# Patient Record
Sex: Female | Born: 1950 | Race: Black or African American | Hispanic: No | Smoking: Former smoker
Health system: Southern US, Community
[De-identification: ages and names within clinical notes are randomized; demographics above are authoritative.]

## PROBLEM LIST (undated history)

## (undated) DIAGNOSIS — I2510 Atherosclerotic heart disease of native coronary artery without angina pectoris: Secondary | ICD-10-CM

## (undated) DIAGNOSIS — Z950 Presence of cardiac pacemaker: Secondary | ICD-10-CM

## (undated) DIAGNOSIS — E785 Hyperlipidemia, unspecified: Secondary | ICD-10-CM

## (undated) DIAGNOSIS — I10 Essential (primary) hypertension: Secondary | ICD-10-CM

## (undated) DIAGNOSIS — I509 Heart failure, unspecified: Secondary | ICD-10-CM

## (undated) DIAGNOSIS — IMO0002 Reserved for concepts with insufficient information to code with codable children: Secondary | ICD-10-CM

## (undated) HISTORY — DX: Hyperlipidemia, unspecified: E78.5

## (undated) HISTORY — DX: Heart failure, unspecified: I50.9

## (undated) HISTORY — PX: COLONOSCOPY: SHX174

## (undated) HISTORY — PX: CARDIAC DEFIBRILLATOR PLACEMENT: SHX171

## (undated) HISTORY — DX: Reserved for concepts with insufficient information to code with codable children: IMO0002

## (undated) HISTORY — DX: Atherosclerotic heart disease of native coronary artery without angina pectoris: I25.10

---

## 2004-09-15 ENCOUNTER — Ambulatory Visit: Payer: Self-pay | Admitting: Internal Medicine

## 2005-09-21 ENCOUNTER — Ambulatory Visit: Payer: Self-pay | Admitting: Internal Medicine

## 2006-09-25 ENCOUNTER — Ambulatory Visit: Payer: Self-pay | Admitting: Internal Medicine

## 2007-09-27 ENCOUNTER — Ambulatory Visit: Payer: Self-pay | Admitting: Internal Medicine

## 2008-09-28 ENCOUNTER — Ambulatory Visit: Payer: Self-pay | Admitting: Internal Medicine

## 2008-12-12 ENCOUNTER — Emergency Department: Payer: Self-pay | Admitting: Emergency Medicine

## 2008-12-12 ENCOUNTER — Inpatient Hospital Stay (HOSPITAL_COMMUNITY): Admission: AD | Admit: 2008-12-12 | Discharge: 2008-12-16 | Payer: Self-pay | Admitting: Cardiovascular Disease

## 2008-12-12 DIAGNOSIS — I219 Acute myocardial infarction, unspecified: Secondary | ICD-10-CM

## 2008-12-12 DIAGNOSIS — I2510 Atherosclerotic heart disease of native coronary artery without angina pectoris: Secondary | ICD-10-CM | POA: Insufficient documentation

## 2008-12-12 HISTORY — DX: Acute myocardial infarction, unspecified: I21.9

## 2008-12-12 HISTORY — DX: Atherosclerotic heart disease of native coronary artery without angina pectoris: I25.10

## 2009-01-06 ENCOUNTER — Emergency Department (HOSPITAL_COMMUNITY): Admission: EM | Admit: 2009-01-06 | Discharge: 2009-01-06 | Payer: Self-pay | Admitting: Emergency Medicine

## 2009-01-19 ENCOUNTER — Encounter: Payer: Self-pay | Admitting: Cardiovascular Disease

## 2009-02-11 ENCOUNTER — Encounter: Payer: Self-pay | Admitting: Cardiovascular Disease

## 2009-09-30 ENCOUNTER — Ambulatory Visit: Payer: Self-pay | Admitting: Internal Medicine

## 2009-12-26 IMAGING — CR DG CHEST 1V PORT
1 series · 1 of 1 positions shown · non-contrast
Comparison: None

CLINICAL DATA: CHF

PORTABLE CHEST - 1 VIEW

[AP]
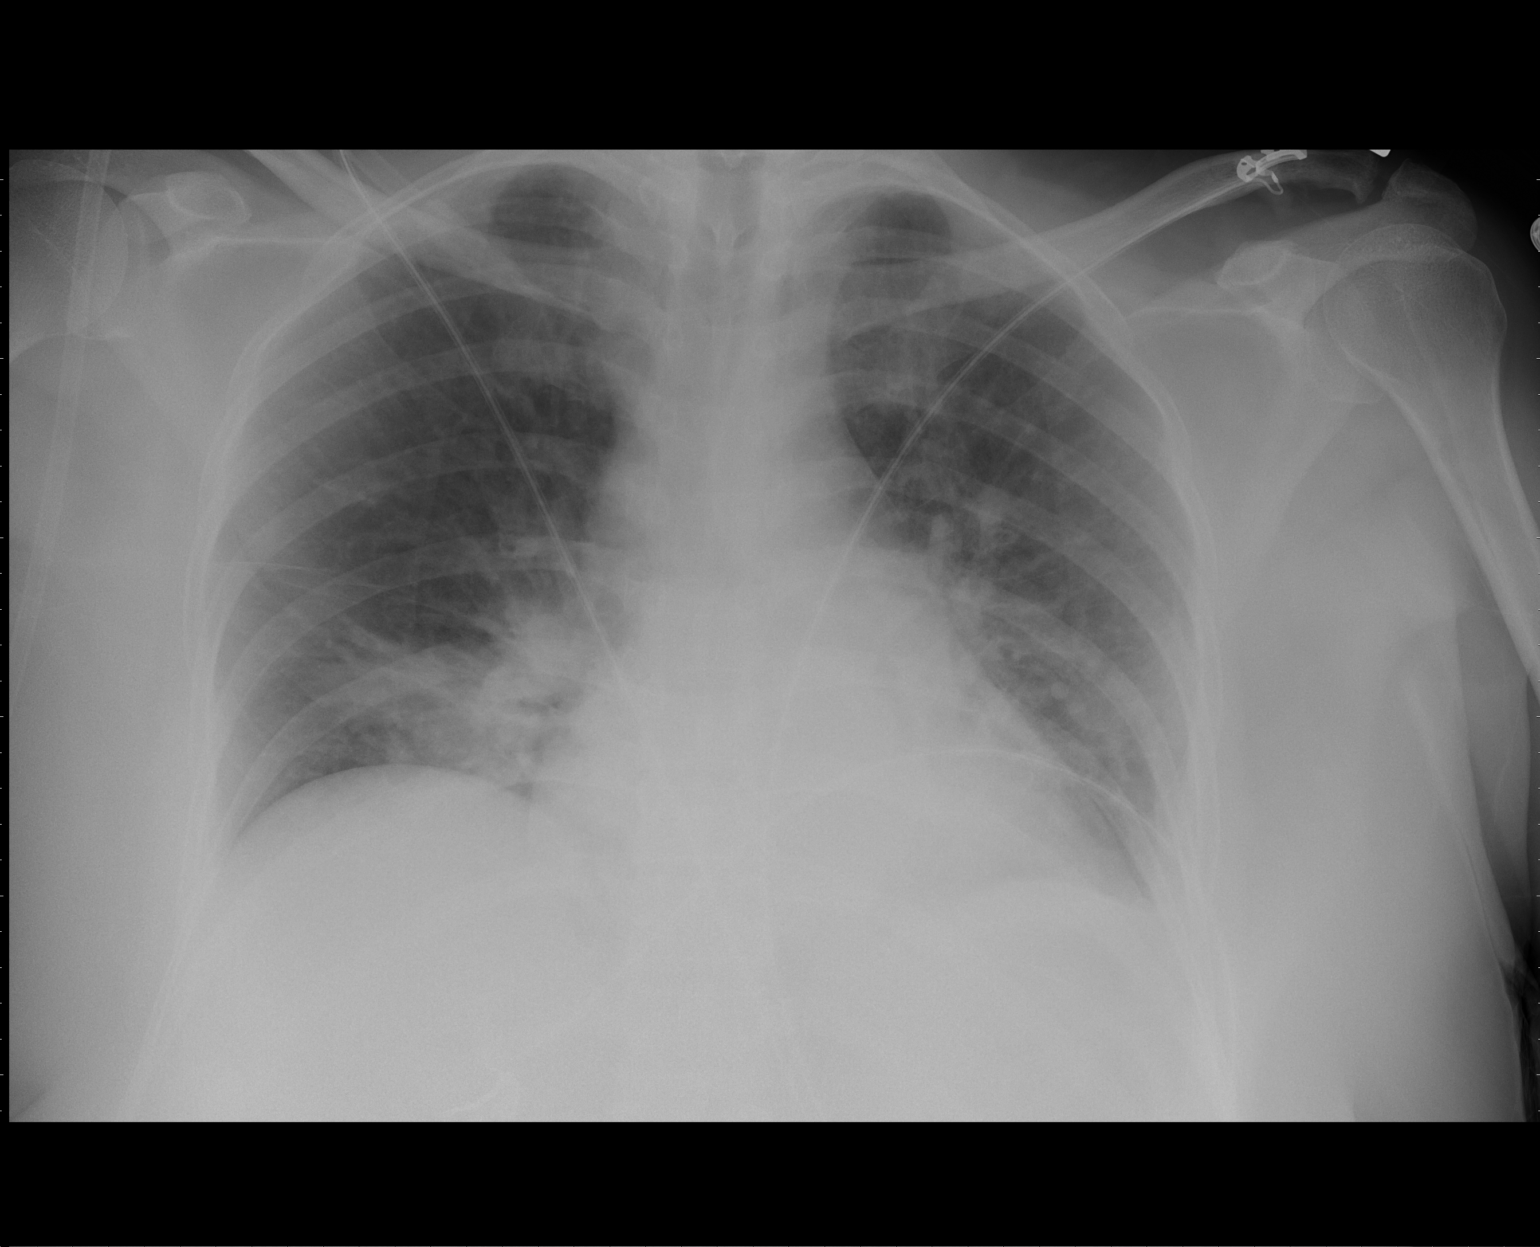

[1 of 1 positions shown; findings below may reference images not displayed]

FINDINGS: The heart is borderline enlarged.  Pulmonary vascularity
is within normal limits.  Lungs are under aerated with bibasilar
atelectasis versus consolidation.  No pneumothorax.
IMPRESSION: Borderline cardiomegaly.

Bibasilar atelectasis versus airspace consolidation.

## 2010-03-31 DIAGNOSIS — I480 Paroxysmal atrial fibrillation: Secondary | ICD-10-CM | POA: Insufficient documentation

## 2010-03-31 DIAGNOSIS — E785 Hyperlipidemia, unspecified: Secondary | ICD-10-CM

## 2010-05-04 ENCOUNTER — Encounter: Payer: Self-pay | Admitting: Cardiovascular Disease

## 2010-05-04 ENCOUNTER — Ambulatory Visit: Payer: Self-pay | Admitting: Internal Medicine

## 2010-05-04 DIAGNOSIS — I428 Other cardiomyopathies: Secondary | ICD-10-CM | POA: Insufficient documentation

## 2010-05-04 DIAGNOSIS — I5022 Chronic systolic (congestive) heart failure: Secondary | ICD-10-CM | POA: Insufficient documentation

## 2010-05-04 DIAGNOSIS — I2589 Other forms of chronic ischemic heart disease: Secondary | ICD-10-CM

## 2010-05-05 LAB — CONVERTED CEMR LAB
CO2: 23 meq/L (ref 19–32)
Calcium: 9.7 mg/dL (ref 8.4–10.5)
Creatinine, Ser: 0.84 mg/dL (ref 0.40–1.20)
Hemoglobin: 14 g/dL (ref 12.0–15.0)
MCHC: 32.3 g/dL (ref 30.0–36.0)
Platelets: 231 10*3/uL (ref 150–400)
RBC: 5.13 M/uL — ABNORMAL HIGH (ref 3.87–5.11)
RDW: 14.4 % (ref 11.5–15.5)
Sodium: 138 meq/L (ref 135–145)
WBC: 6.5 10*3/uL (ref 4.0–10.5)

## 2010-05-09 ENCOUNTER — Ambulatory Visit (HOSPITAL_COMMUNITY): Admission: RE | Admit: 2010-05-09 | Discharge: 2010-05-10 | Payer: Self-pay | Admitting: Internal Medicine

## 2010-05-09 ENCOUNTER — Ambulatory Visit: Payer: Self-pay | Admitting: Internal Medicine

## 2010-05-09 DIAGNOSIS — Z95810 Presence of automatic (implantable) cardiac defibrillator: Secondary | ICD-10-CM

## 2010-05-09 HISTORY — DX: Presence of automatic (implantable) cardiac defibrillator: Z95.810

## 2010-05-10 ENCOUNTER — Encounter: Payer: Self-pay | Admitting: Internal Medicine

## 2010-05-17 ENCOUNTER — Encounter: Payer: Self-pay | Admitting: Internal Medicine

## 2010-05-20 ENCOUNTER — Ambulatory Visit: Payer: Self-pay | Admitting: Internal Medicine

## 2010-05-27 ENCOUNTER — Encounter: Payer: Self-pay | Admitting: Internal Medicine

## 2010-05-27 ENCOUNTER — Ambulatory Visit: Payer: Self-pay | Admitting: Internal Medicine

## 2010-06-03 ENCOUNTER — Telehealth: Payer: Self-pay | Admitting: Internal Medicine

## 2010-06-03 ENCOUNTER — Encounter: Payer: Self-pay | Admitting: Internal Medicine

## 2010-07-08 ENCOUNTER — Ambulatory Visit: Payer: Self-pay | Admitting: Cardiovascular Disease

## 2010-08-05 ENCOUNTER — Ambulatory Visit: Payer: Self-pay | Admitting: Cardiology

## 2010-08-30 ENCOUNTER — Ambulatory Visit: Payer: Self-pay | Admitting: Internal Medicine

## 2010-08-30 DIAGNOSIS — Z95810 Presence of automatic (implantable) cardiac defibrillator: Secondary | ICD-10-CM

## 2010-09-08 ENCOUNTER — Ambulatory Visit: Payer: Self-pay | Admitting: Cardiovascular Disease

## 2010-09-08 ENCOUNTER — Encounter: Payer: Self-pay | Admitting: Internal Medicine

## 2010-10-03 ENCOUNTER — Ambulatory Visit: Payer: Self-pay | Admitting: Internal Medicine

## 2010-12-02 ENCOUNTER — Encounter: Payer: Self-pay | Admitting: Internal Medicine

## 2010-12-02 ENCOUNTER — Ambulatory Visit: Admission: RE | Admit: 2010-12-02 | Discharge: 2010-12-02 | Payer: Self-pay | Source: Home / Self Care

## 2010-12-13 NOTE — Op Note (Signed)
Summary: Operative Report  Operative Report   Imported By: Frazier Butt Chriscoe 06/03/2010 16:33:13  _____________________________________________________________________  External Attachment:    Type:   Image     Comment:   External Document

## 2010-12-13 NOTE — Assessment & Plan Note (Signed)
Summary: nep eval for icd placement/jml   Visit Type:  Initial Consult Referring Provider:  Dr Kyra Searles Primary Provider:  Dr Dan Humphreys  CC:  low HR -- possible PCM.  History of Present Illness: [name] is referred today by Dr. Elease Hashimoto for consideration for prophylactic ICD insertion.  She has a h/o anterior MI in January of 2010 when she presented late having not sought medical attention initially within the first 3-4 hrs of symptoms.  She was treated with LAD stenting followed by maximal medical therapy with beta blockers and ACE inhibitors along with ASA and plavix.  She has done fairly well with persistent Class 2 CHF symptoms.  She has not had syncope.  She apparently had atrial fib initially after her MI but this has resolved and she is no longer on amiodarone.  She denies c/p or peripheral edema.  Preventive Screening-Counseling & Management  Caffeine-Diet-Exercise     Does Patient Exercise: yes      Drug Use:  no.    Current Medications (verified): 1)  Aspirin 81 Mg Tbec (Aspirin) .... Take One Tablet By Mouth Daily 2)  Plavix 75 Mg Tabs (Clopidogrel Bisulfate) .... Take One Tablet By Mouth Daily 3)  Furosemide 40 Mg Tabs (Furosemide) .... Take One Tablet By Mouth Daily. 4)  Potassium Chloride Cr 10 Meq Cr-Caps (Potassium Chloride) .... Take One Tablet By Mouth Daily 5)  Carvedilol 12.5 Mg Tabs (Carvedilol) .... Take One Tablet By Mouth Twice A Day 6)  Lisinopril 5 Mg Tabs (Lisinopril) .... Take One Tablet By Mouth Daily 7)  Simvastatin 40 Mg Tabs (Simvastatin) .... Take One Tablet By Mouth Daily At Bedtime 8)  Proair Hfa 108 (90 Base) Mcg/act Aers (Albuterol Sulfate) .... Every 6 Hrs As Needed  Allergies (verified): No Known Drug Allergies  Past History:  Past Medical History: Last updated: 03/31/2010 CAD: S/P ANTERIOR WALL MYOCARDIAL INFARCTION / S/P POST PTCA AND STENTING OF HER LAD 12/12/08. PLACEMENT OF VISION STENT. POST DILATED WITH A 3.25MM [place] VOYAGER CHF EF =  30-35% BY ECHO. SHE HAS AN APICAL ANEURYSM. HX OF INFECTED SALIVARY GLAND TRANSIENT ATRIAL FIB DYSLIPIDEMIA  Family History: Last updated: 05/04/2010 Family History of Cancer:  Family History of Coronary Artery Disease:   Social History: Last updated: 05/04/2010 Tobacco Use - Former.  Retired  Married  Alcohol Use - no Regular Exercise - yes Drug Use - no  Past Surgical History: none  Family History: Family History of Cancer:  Family History of Coronary Artery Disease:   Social History: Tobacco Use - Former.  Retired  Married  Alcohol Use - no Regular Exercise - yes Drug Use - no Does Patient Exercise:  yes Drug Use:  no  Review of Systems       All systems reviewed and negative except as noted in the HPI.  Vital Signs:  Patient profile:   60 year old female Height:      71 inches Weight:      225 pounds BMI:     31.49 Pulse rate:   84 / minute BP sitting:   130 / 80  (left arm) Cuff size:   regular  Vitals Entered By: Hardin Negus, RMA (May 04, 2010 11:02 AM)  Physical Exam  General:  Well appearing middle age woman in no acute distress.  HEENT: normal Neck: supple. No JVD. Carotids 2+ bilaterally no bruits Cor: RRR with normal S1 and S2.  PMI is enlarged and laterally displaced. No murmurs. Lungs: CTA with no wheezes,  rales, or rhonchi. Ab: soft, nontender. nondistended. No HSM. Good bowel sounds Ext: warm. no cyanosis, clubbing or edema Neuro: alert and oriented. Grossly nonfocal. affect pleasant    EKG  Procedure date:  05/04/2010  Findings:      Normal sinus rhythm with rate of:    Comments:      Evidence of remote septal MI.    Impression & Recommendations:  Problem # 1:  CARDIOMYOPATHY, ISCHEMIC (ICD-414.8) I have discussed the treament options with the patient and the risks/benefits/goals/expectations of the procedure have been discussed and she wishes to proceed with prophylactic ICD implant. The following medications were  removed from the medication list:    Nitrostat 0.4 Mg Subl (Nitroglycerin) .Marland Kitchen... 1 tablet under tongue at onset of chest pain; you may repeat every 5 minutes for up to 3 doses. Her updated medication list for this problem includes:    Aspirin 81 Mg Tbec (Aspirin) .Marland Kitchen... Take one tablet by mouth daily    Plavix 75 Mg Tabs (Clopidogrel bisulfate) .Marland Kitchen... Take one tablet by mouth daily    Furosemide 40 Mg Tabs (Furosemide) .Marland Kitchen... Take one tablet by mouth daily.    Carvedilol 12.5 Mg Tabs (Carvedilol) .Marland Kitchen... Take one tablet by mouth twice a day    Lisinopril 5 Mg Tabs (Lisinopril) .Marland Kitchen... Take one tablet by mouth daily  Orders: Bi-V ICD (Bi-V ICD) T-Basic Metabolic Panel (14782-95621) T-CBC No Diff ([id number]) T-Protime, Auto ([id number])  Problem # 2:  CHRONIC SYSTOLIC HEART FAILURE (ICD-428.22) She will continue her meds as below.  Her symptoms remain class 2. The following medications were removed from the medication list:    Nitrostat 0.4 Mg Subl (Nitroglycerin) .Marland Kitchen... 1 tablet under tongue at onset of chest pain; you may repeat every 5 minutes for up to 3 doses. Her updated medication list for this problem includes:    Aspirin 81 Mg Tbec (Aspirin) .Marland Kitchen... Take one tablet by mouth daily    Plavix 75 Mg Tabs (Clopidogrel bisulfate) .Marland Kitchen... Take one tablet by mouth daily    Furosemide 40 Mg Tabs (Furosemide) .Marland Kitchen... Take one tablet by mouth daily.    Carvedilol 12.5 Mg Tabs (Carvedilol) .Marland Kitchen... Take one tablet by mouth twice a day    Lisinopril 5 Mg Tabs (Lisinopril) .Marland Kitchen... Take one tablet by mouth daily  Orders: Bi-V ICD (Bi-V ICD) T-Basic Metabolic Panel (44010-27253) T-CBC No Diff ([id number]) T-Protime, Auto ([id number])  Problem # 3:  HYPERLIPIDEMIA-MIXED (ICD-272.4) She will remain on a low fat diet and medical therapy as below. Her updated medication list for this problem includes:    Simvastatin 40 Mg Tabs (Simvastatin) .Marland Kitchen... Take one tablet by mouth daily at bedtime  Patient  Instructions: 1)  Your physician recommends that you continue on your current medications as directed. Please refer to the Current Medication list given to you today. 2)  Your physician has recommended that you have a defibrillator inserted.  An implantable cardioverter defibrillator (ICD) is a small device that is placed in your chest or, in rare cases, your abdomen. This device uses electrical pulses or shocks to help control life-threatening, irregular heartbeats that could lead the heart to suddenly stop beating (sudden cardiac arrest). Leads are attached to the ICD that goes into your heart. This is done in the hospital and usually requires an overnight stay. Please see the instruction sheet given to you today for more information.

## 2010-12-13 NOTE — Assessment & Plan Note (Signed)
Summary: 11:15 APPT/AMD   Visit Type:  Follow-up Referring [name]:  Dr Kyra Searles Primary [name]:  Dr Dan Humphreys  CC:  Defib Implant 05/09/10.  "Doing well'..  History of Present Illness: [name] returns today for followup.  She is a pleasant 60 yo woman with an ICM s/p MI, class 2 CHF, and HTN.  She has an EF of 30% and underwent prophylactic ICD implant back in July.  She denies any c/p, sob, or peripheral edema and she has not had any ICD therapies.  Current Medications (verified): 1)  Aspirin 81 Mg Tbec (Aspirin) .... Take One Tablet By Mouth Daily 2)  Plavix 75 Mg Tabs (Clopidogrel Bisulfate) .... Take One Tablet By Mouth Daily 3)  Furosemide 40 Mg Tabs (Furosemide) .... Take One Tablet By Mouth Daily. 4)  Potassium Chloride Cr 10 Meq Cr-Caps (Potassium Chloride) .... Take One Tablet By Mouth Daily 5)  Carvedilol 12.5 Mg Tabs (Carvedilol) .... Take One Tablet By Mouth Twice A Day 6)  Lisinopril 5 Mg Tabs (Lisinopril) .... Take One Tablet By Mouth Daily 7)  Simvastatin 40 Mg Tabs (Simvastatin) .... Take One Tablet By Mouth Daily At Bedtime  Allergies (verified): No Known Drug Allergies  Past History:  Past Medical History: Last updated: 03/31/2010 CAD: S/P ANTERIOR WALL MYOCARDIAL INFARCTION / S/P POST PTCA AND STENTING OF HER LAD 12/12/08. PLACEMENT OF VISION STENT. POST DILATED WITH A 3.25MM [place] VOYAGER CHF EF = 30-35% BY ECHO. SHE HAS AN APICAL ANEURYSM. HX OF INFECTED SALIVARY GLAND TRANSIENT ATRIAL FIB DYSLIPIDEMIA  Past Surgical History: Last updated: 05/04/2010 none  Family History: Last updated: 05/04/2010 Family History of Cancer:  Family History of Coronary Artery Disease:   Social History: Last updated: 05/04/2010 Tobacco Use - Former.  Retired  Married  Alcohol Use - no Regular Exercise - yes Drug Use - no  Risk Factors: Exercise: yes (05/04/2010)  Risk Factors: Smoking Status: quit (03/31/2010)  Review of Systems  The patient denies chest  pain, syncope, dyspnea on exertion, and peripheral edema.    Vital Signs:  Patient profile:   60 year old female Height:      71 inches Weight:      226 pounds BMI:     31.63 Pulse rate:   88 / minute BP sitting:   128 / 62  (left arm) Cuff size:   regular  Vitals Entered By: Bishop Dublin, CMA (August 30, 2010 10:56 AM)  Physical Exam  General:  Well appearing middle age woman in no acute distress.  HEENT: normal Neck: supple. No JVD. Carotids 2+ bilaterally no bruits Cor: RRR with normal S1 and S2.  PMI is enlarged and laterally displaced. No murmurs. Lungs: CTA with no wheezes, rales, or rhonchi. A well healed ICD incision is present. Ab: soft, nontender. nondistended. No HSM. Good bowel sounds Ext: warm. no cyanosis, clubbing or edema Neuro: alert and oriented. Grossly nonfocal. affect pleasant     ICD Specifications Following MD:  [name], MD     ICD Vendor:  Medtronic     ICD Model Number:  [id number]     ICD Serial Number:  [id number] ICD DOI:  05/09/2010     ICD Implanting MD:  [name], MD  Lead 1:    Location: RV     DOI: 05/09/2010     Model #: [id number]     Serial #: [id number]     Status: active  Indications::  CM   ICD Follow Up Remote Check?  No Battery  Voltage:  3.20 V     Charge Time:  7.4 seconds     Underlying rhythm:  SR ICD Dependent:  No       ICD Device Measurements Right Ventricle:  Amplitude: 11.5 mV, Impedance: 589 ohms, Threshold: 0.75 V at 0.4 msec Shock Impedance: 53/69 ohms   Episodes Coumadin:  No Shock:  0     ATP:  0     Nonsustained:  0     Ventricular Pacing:  <0.1%  Brady Parameters Mode VVI     Lower Rate Limit:  40      Tachy Zones VF:  188     Next Cardiology Appt Due:  11/13/2010 Tech Comments:  Lead impedance alert values reprogrammed.  RV reprogrammed for chronic threshold.  No [facility] at this time.  ROV 3 months [place] [facility]. Altha Harm, LPN  August 30, 2010 11:06 AM  MD Comments:  Agree with  above.  Impression & Recommendations:  Problem # 1:  AUTOMATIC IMPLANTABLE CARDIAC DEFIBRILLATOR SITU (ICD-V45.02) Her device is working normally today.  Will recheck in several months.  Problem # 2:  CARDIOMYOPATHY, ISCHEMIC (ICD-414.8) She denies anginal symptoms.  Continue meds as below. Her updated medication list for this problem includes:    Aspirin 81 Mg Tbec (Aspirin) .Marland Kitchen... Take one tablet by mouth daily    Plavix 75 Mg Tabs (Clopidogrel bisulfate) .Marland Kitchen... Take one tablet by mouth daily    Furosemide 40 Mg Tabs (Furosemide) .Marland Kitchen... Take one tablet by mouth daily.    Carvedilol 12.5 Mg Tabs (Carvedilol) .Marland Kitchen... Take one tablet by mouth twice a day    Lisinopril 5 Mg Tabs (Lisinopril) .Marland Kitchen... Take one tablet by mouth daily  Problem # 3:  CHRONIC SYSTOLIC HEART FAILURE (ICD-428.22) Her symptoms remain class 2.  She will continue with a low sodium diet. Her updated medication list for this problem includes:    Aspirin 81 Mg Tbec (Aspirin) .Marland Kitchen... Take one tablet by mouth daily    Plavix 75 Mg Tabs (Clopidogrel bisulfate) .Marland Kitchen... Take one tablet by mouth daily    Furosemide 40 Mg Tabs (Furosemide) .Marland Kitchen... Take one tablet by mouth daily.    Carvedilol 12.5 Mg Tabs (Carvedilol) .Marland Kitchen... Take one tablet by mouth twice a day    Lisinopril 5 Mg Tabs (Lisinopril) .Marland Kitchen... Take one tablet by mouth daily

## 2010-12-13 NOTE — Progress Notes (Signed)
Summary: test results  Phone Note Call from Patient Call back at Home Phone [phone]   Caller: Daughter- alcamise leach [phone] Reason for Call: Talk to Nurse, Lab or Test Results Details for Reason: ct scan. Initial call taken by: Lorne Skeens,  June 03, 2010 4:59 PM  Follow-up for Phone Call        lmom for daughter let her know I'm not here on Mon Dennis Bast, RN, BSN  June 03, 2010 5:58 PM

## 2010-12-13 NOTE — Miscellaneous (Signed)
Summary: Device preload  Clinical Lists Changes  Observations: Added new observation of ICD INDICATN: CM (05/17/2010 15:31) Added new observation of ICDLEADSTAT1: active (05/17/2010 15:31) Added new observation of ICDLEADSER1: [id number] V (05/17/2010 15:31) Added new observation of ICDLEADMOD1: 9629  (05/17/2010 15:31) Added new observation of ICDLEADLOC1: RV  (05/17/2010 15:31) Added new observation of ICD IMP MD: [name], MD  (05/17/2010 15:31) Added new observation of ICDLEADDOI1: 05/09/2010  (05/17/2010 15:31) Added new observation of ICD IMPL DTE: 05/09/2010  (05/17/2010 15:31) Added new observation of ICD SERL#: [id number] H  (05/17/2010 15:31) Added new observation of ICD MODL#: [id number]  (05/17/2010 15:31) Added new observation of ICDMANUFACTR: Medtronic  (05/17/2010 15:31) Added new observation of ICD MD: [name], MD  (05/17/2010 15:31)       ICD Specifications Following MD:  [name], MD     ICD Vendor:  Medtronic     ICD Model Number:  D274VRC     ICD Serial Number:  [id number] ICD DOI:  05/09/2010     ICD Implanting MD:  [name], MD  Lead 1:    Location: RV     DOI: 05/09/2010     Model #: 6440     Serial #: [id number]     Status: active  Indications::  CM

## 2010-12-13 NOTE — Letter (Signed)
Summary: Implantable Device Instructions  Architectural technologist at [address]   [place], [place] [address]   Phone: [phone]  Fax: [phone]      Implantable Device Instructions  You are scheduled for:  _____ Permanent Transvenous Pacemaker __x___ Implantable Cardioverter Defibrillator _____ Implantable Loop Recorder _____ Generator Change  on June 27 at 7:30 with Dr.Reedy Biernat.  1.  Please arrive at the Short Stay Center at [facility] at 6:30am on the day of your procedure.  2.  Do not eat or drink the night before your procedure.  3.  Complete lab work today   4.  Do NOT take Plavix for 2  days prior to your procedure:  Do NOT take furosemide the morning of procedure  5.  Plan for an overnight stay.  Bring your insurance cards and a list of your medications.  6.  Wash your chest and neck with antibacterial soap (any brand) the evening before and the morning of your procedure.  Rinse well.  7.  Education material received:     ICD today in office.                      *If you have ANY questions after you get home, please call the office [phone].  *Every attempt is made to prevent procedures from being rescheduled.  Due to the nauture of Electrophysiology, rescheduling can happen.  The physician is always aware and directs the staff when this occurs.

## 2010-12-13 NOTE — Procedures (Signed)
Summary: WOUND CHECK 10 DAYS   Current Medications (verified): 1)  Aspirin 81 Mg Tbec (Aspirin) .... Take One Tablet By Mouth Daily 2)  Plavix 75 Mg Tabs (Clopidogrel Bisulfate) .... Take One Tablet By Mouth Daily 3)  Furosemide 40 Mg Tabs (Furosemide) .... Take One Tablet By Mouth Daily. 4)  Potassium Chloride Cr 10 Meq Cr-Caps (Potassium Chloride) .... Take One Tablet By Mouth Daily 5)  Carvedilol 12.5 Mg Tabs (Carvedilol) .... Take One Tablet By Mouth Twice A Day 6)  Lisinopril 5 Mg Tabs (Lisinopril) .... Take One Tablet By Mouth Daily 7)  Simvastatin 40 Mg Tabs (Simvastatin) .... Take One Tablet By Mouth Daily At Bedtime  Allergies (verified): No Known Drug Allergies   ICD Specifications Following MD:  [name], MD     ICD Vendor:  Medtronic     ICD Model Number:  D274VRC     ICD Serial Number:  [id number] H ICD DOI:  05/09/2010     ICD Implanting MD:  [name], MD  Lead 1:    Location: RV     DOI: 05/09/2010     Model #: 4098     Serial #: [id number]     Status: active  Indications::  CM   ICD Follow Up Remote Check?  No Battery Voltage:  3.20 V     Charge Time:  7.4 seconds     Underlying rhythm:  SR ICD Dependent:  No       ICD Device Measurements Right Ventricle:  Amplitude: 11.4 mV, Impedance: 589 ohms, Threshold: 0.5 V at 0.4 msec Shock Impedance: 43/50 ohms   Episodes Coumadin:  No Shock:  0     ATP:  0     Nonsustained:  0     Ventricular Pacing:  <0.1%  Brady Parameters Mode VVI     Lower Rate Limit:  40      Tachy Zones VF:  188     Next Cardiology Appt Due:  08/13/2010 Tech Comments:  Steri strips removed.  No redness or edema noted.  No parameter changes. Device function normal.  Activity restrictions along with device discharge instructions reviewed with the patient.  ROV 3 months with Dr. Ladona Ridgel in [place]. Altha Harm, LPN  May 20, 5620 8:52 AM

## 2010-12-15 NOTE — Procedures (Signed)
Summary: PACER/AMD   Current Medications (verified): 1)  Aspirin 81 Mg Tbec (Aspirin) .... Take One Tablet By Mouth Daily 2)  Plavix 75 Mg Tabs (Clopidogrel Bisulfate) .... Take One Tablet By Mouth Daily 3)  Furosemide 40 Mg Tabs (Furosemide) .... Take One Tablet By Mouth Daily. 4)  Potassium Chloride Cr 10 Meq Cr-Caps (Potassium Chloride) .... Take One Tablet By Mouth Daily 5)  Carvedilol 12.5 Mg Tabs (Carvedilol) .... Take One Tablet By Mouth Twice A Day 6)  Lisinopril 5 Mg Tabs (Lisinopril) .... Take One Tablet By Mouth Daily 7)  Simvastatin 40 Mg Tabs (Simvastatin) .... Take One Tablet By Mouth Daily At Bedtime  Allergies (verified): No Known Drug Allergies    ICD Specifications Following MD:  [name], MD     ICD Vendor:  [employer]tronic     ICD Model Number:  D274VRC     ICD Serial Number:  [id number] H ICD DOI:  05/09/2010     ICD Implanting MD:  [name], MD  Lead 1:    Location: RV     DOI: 05/09/2010     Model #: [id number]     Serial #: [id number]     Status: active  Indications::  CM   ICD Follow Up Remote Check?  No Battery Voltage:  3.17 V     Charge Time:  8.2 seconds     Underlying rhythm:  SR ICD Dependent:  No       ICD Device Measurements Right Ventricle:  Amplitude: 10.8 mV, Impedance: 589 ohms, Threshold: 0.5 V at 0.4 msec Shock Impedance: 54/73 ohms   Episodes Coumadin:  No Shock:  0     ATP:  0     Nonsustained:  4     Ventricular Pacing:  <0.1%  Brady Parameters Mode VVI     Lower Rate Limit:  40      Tachy Zones VF:  188     Next Cardiology Appt Due:  05/14/2011 Tech Comments:  No parameter changes. Device function normal.  4 NSVT episodes probable ST.  No [facility] @ this time.  Ms. Stern would like to follow up every 6 months for financial reasons. Altha Harm, LPN  December 02, 2010 4:00 PM

## 2010-12-21 NOTE — Cardiovascular Report (Signed)
Summary: Office Visit   Office Visit   Imported By: Roderic Ovens 12/14/2010 15:59:45  _____________________________________________________________________  External Attachment:    Type:   Image     Comment:   External Document

## 2011-01-04 NOTE — Progress Notes (Signed)
Summary: [facility]: Progress Note  [facility]: Progress Note   Imported By: Earl Many 12/28/2010 17:43:34  _____________________________________________________________________  External Attachment:    Type:   Image     Comment:   External Document

## 2011-01-29 LAB — SURGICAL PCR SCREEN: Staphylococcus aureus: NEGATIVE

## 2011-02-02 ENCOUNTER — Other Ambulatory Visit: Payer: Self-pay | Admitting: *Deleted

## 2011-02-02 DIAGNOSIS — E785 Hyperlipidemia, unspecified: Secondary | ICD-10-CM

## 2011-02-02 DIAGNOSIS — I10 Essential (primary) hypertension: Secondary | ICD-10-CM

## 2011-02-02 DIAGNOSIS — I2510 Atherosclerotic heart disease of native coronary artery without angina pectoris: Secondary | ICD-10-CM

## 2011-02-02 DIAGNOSIS — I509 Heart failure, unspecified: Secondary | ICD-10-CM

## 2011-02-02 MED ORDER — LISINOPRIL 5 MG PO TABS: 5.0000 mg | ORAL_TABLET | Freq: Every day | ORAL | Status: DC

## 2011-02-02 MED ORDER — CLOPIDOGREL BISULFATE 75 MG PO TABS: 75.0000 mg | ORAL_TABLET | Freq: Every day | ORAL | Status: DC

## 2011-02-02 MED ORDER — FUROSEMIDE 40 MG PO TABS: 40.0000 mg | ORAL_TABLET | Freq: Every day | ORAL | Status: DC

## 2011-02-02 MED ORDER — SIMVASTATIN 40 MG PO TABS: 40.0000 mg | ORAL_TABLET | Freq: Every evening | ORAL | Status: DC

## 2011-02-02 NOTE — Telephone Encounter (Signed)
Refill on meds per pharmacy fax.

## 2011-02-23 ENCOUNTER — Ambulatory Visit: Payer: Self-pay | Admitting: Cardiovascular Disease

## 2011-02-27 LAB — COMPREHENSIVE METABOLIC PANEL
ALT: 55 U/L — ABNORMAL HIGH (ref 0–35)
AST: 297 U/L — ABNORMAL HIGH (ref 0–37)
Albumin: 3.9 g/dL (ref 3.5–5.2)
Alkaline Phosphatase: 97 U/L (ref 39–117)
CO2: 27 mEq/L (ref 19–32)
Calcium: 9.6 mg/dL (ref 8.4–10.5)
Chloride: 100 mEq/L (ref 96–112)
Creatinine, Ser: 0.7 mg/dL (ref 0.4–1.2)
GFR calc Af Amer: >60 mL/min (ref >60)
Glucose, Bld: 121 mg/dL — ABNORMAL HIGH (ref 70–99)
Potassium: 3.5 mEq/L (ref 3.5–5.1)
Sodium: 139 mEq/L (ref 135–145)
Total Bilirubin: 0.8 mg/dL (ref 0.3–1.2)
Total Protein: 7.7 g/dL (ref 6.0–8.3)

## 2011-02-27 LAB — CARDIAC PANEL(CRET KIN+CKTOT+MB+TROPI)
CK, MB: 536.7 ng/mL — ABNORMAL HIGH (ref 0.3–4.0)
Relative Index: 9.4 — ABNORMAL HIGH (ref 0.0–2.5)
Total CK: 3026 U/L — ABNORMAL HIGH (ref 7–177)
Total CK: 4068 U/L — ABNORMAL HIGH (ref 7–177)
Total CK: 5718 U/L — ABNORMAL HIGH (ref 7–177)
Troponin I: 57.41 ng/mL (ref 0.00–0.06)
Troponin I: >100 ng/mL (ref 0.00–0.06)

## 2011-02-27 LAB — CBC
HCT: 45 % (ref 36.0–46.0)
Hemoglobin: 13.4 g/dL (ref 12.0–15.0)
Hemoglobin: 14.8 g/dL (ref 12.0–15.0)
MCHC: 32.9 g/dL (ref 30.0–36.0)
MCV: 84.3 fL (ref 78.0–100.0)
MCV: 85.7 fL (ref 78.0–100.0)
Platelets: 205 10*3/uL (ref 150–400)
Platelets: 230 10*3/uL (ref 150–400)
RBC: 5.25 MIL/uL — ABNORMAL HIGH (ref 3.87–5.11)
RDW: 14.4 % (ref 11.5–15.5)
RDW: 14.6 % (ref 11.5–15.5)
WBC: 10 10*3/uL (ref 4.0–10.5)

## 2011-02-27 LAB — URINALYSIS, ROUTINE W REFLEX MICROSCOPIC
Ketones, ur: NEGATIVE mg/dL
Protein, ur: NEGATIVE mg/dL
Specific Gravity, Urine: 1.02 (ref 1.005–1.030)
Urobilinogen, UA: 0.2 mg/dL (ref 0.0–1.0)
pH: 5.5 (ref 5.0–8.0)

## 2011-02-27 LAB — LIPID PANEL
Cholesterol: 189 mg/dL (ref 0–200)
HDL: 36 mg/dL — ABNORMAL LOW (ref >39)
LDL Cholesterol: 137 mg/dL — ABNORMAL HIGH (ref 0–99)
Total CHOL/HDL Ratio: 5.3 RATIO
Triglycerides: 78 mg/dL (ref <150)
VLDL: 16 mg/dL (ref 0–40)

## 2011-02-27 LAB — HEMOGLOBIN A1C
Hgb A1c MFr Bld: 6.2 % — ABNORMAL HIGH (ref 4.6–6.1)
Mean Plasma Glucose: 131 mg/dL

## 2011-02-27 LAB — PROTIME-INR
INR: 1.4 (ref 0.00–1.49)
Prothrombin Time: 17.9 seconds — ABNORMAL HIGH (ref 11.6–15.2)

## 2011-02-27 LAB — URINE MICROSCOPIC-ADD ON

## 2011-02-27 LAB — BASIC METABOLIC PANEL
BUN: 11 mg/dL (ref 6–23)
CO2: 28 mEq/L (ref 19–32)
Glucose, Bld: 152 mg/dL — ABNORMAL HIGH (ref 70–99)

## 2011-02-27 LAB — URINE CULTURE

## 2011-02-27 LAB — BRAIN NATRIURETIC PEPTIDE: Pro B Natriuretic peptide (BNP): 226 pg/mL — ABNORMAL HIGH (ref 0.0–100.0)

## 2011-02-28 LAB — PROTIME-INR
INR: 1.1 (ref 0.00–1.49)
Prothrombin Time: 14.6 seconds (ref 11.6–15.2)

## 2011-02-28 LAB — BASIC METABOLIC PANEL WITH GFR
CO2: 27 meq/L (ref 19–32)
Calcium: 9.8 mg/dL (ref 8.4–10.5)
Chloride: 104 meq/L (ref 96–112)
GFR calc Af Amer: >60 mL/min (ref >60)
Sodium: 141 meq/L (ref 135–145)

## 2011-02-28 LAB — BASIC METABOLIC PANEL
BUN: 8 mg/dL (ref 6–23)
Creatinine, Ser: 0.91 mg/dL (ref 0.4–1.2)
GFR calc non Af Amer: >60 mL/min (ref >60)
Glucose, Bld: 114 mg/dL — ABNORMAL HIGH (ref 70–99)
Potassium: 3.8 mEq/L (ref 3.5–5.1)

## 2011-02-28 LAB — CBC
HCT: 38.9 % (ref 36.0–46.0)
Hemoglobin: 13.3 g/dL (ref 12.0–15.0)
MCHC: 34.3 g/dL (ref 30.0–36.0)
MCV: 84.4 fL (ref 78.0–100.0)
Platelets: 222 10*3/uL (ref 150–400)
RBC: 4.61 MIL/uL (ref 3.87–5.11)
RDW: 14.9 % (ref 11.5–15.5)
WBC: 7.9 10*3/uL (ref 4.0–10.5)

## 2011-02-28 LAB — POCT CARDIAC MARKERS
CKMB, poc: <1 ng/mL — ABNORMAL LOW (ref 1.0–8.0)
CKMB, poc: <1 ng/mL — ABNORMAL LOW (ref 1.0–8.0)
CKMB, poc: <1 ng/mL — ABNORMAL LOW (ref 1.0–8.0)
Myoglobin, poc: 56.7 ng/mL (ref 12–200)
Myoglobin, poc: 65.2 ng/mL (ref 12–200)
Myoglobin, poc: 79.5 ng/mL (ref 12–200)
Troponin i, poc: <0.05 ng/mL (ref 0.00–0.09)
Troponin i, poc: <0.05 ng/mL (ref 0.00–0.09)
Troponin i, poc: <0.05 ng/mL (ref 0.00–0.09)

## 2011-02-28 LAB — APTT: aPTT: 29 s (ref 24–37)

## 2011-02-28 LAB — DIFFERENTIAL
Basophils Absolute: 0 10*3/uL (ref 0.0–0.1)
Basophils Relative: 0 % (ref 0–1)
Eosinophils Absolute: 0.1 10*3/uL (ref 0.0–0.7)
Eosinophils Relative: 1 % (ref 0–5)
Lymphocytes Relative: 13 % (ref 12–46)
Lymphs Abs: 1 10*3/uL (ref 0.7–4.0)
Monocytes Absolute: 0.6 K/uL (ref 0.1–1.0)
Monocytes Relative: 8 % (ref 3–12)
Neutro Abs: 6.1 K/uL (ref 1.7–7.7)
Neutrophils Relative %: 78 % — ABNORMAL HIGH (ref 43–77)

## 2011-03-28 NOTE — H&P (Signed)
NAMERAFIA, SHEDDEN NO.:  [id number]   MEDICAL RECORD NO.:  [id number]          PATIENT TYPE:  INP   LOCATION:  2906                         FACILITY:  [facility]   PHYSICIAN:  Vesta Mixer, M.D. DATE OF BIRTH:  1951/10/16   DATE OF ADMISSION:  12/12/2008  DATE OF DISCHARGE:                              HISTORY & PHYSICAL   [name] is a 60 year old female from [place].  She is admitted  in transfer for a ST-segment elevation myocardial infarction.   [name] is a 60 year old female with a history of hypertension.  She does not have any cardiac history.  She also has a history of  cigarette smoking.  [name] started having chest pain around  midnight on December 12, 2008.  She stayed at home for about 4 hours and  eventually called her daughter.  Her daughter eventually convinced her  to call EMS, which took her to [facility].  There, she  was found to have ST-segment elevation in her anterior leads consistent  with ST-segment elevation MI.  She was given morphine and nitroglycerin,  and had some improvement.  She was transferred to [facility].  There was significant delay in getting [facility] to-and-from [facility] in that  we had there was approximately 8 inches of snow on the roads.   She eventually arrived at [facility] and taken directly to the  cath lab for emergent angioplasty.   [name] has not had any previous episodes of chest pain.  She has  denied any previous episodes of shortness of breath.   Review of systems was some somewhat limited given the emergent nature of  the admission.   CURRENT MEDICATIONS:  Hydrochlorothiazide once a day.   ALLERGIES:  None.   PAST MEDICAL HISTORY:  Hypertension.   SOCIAL HISTORY:  The patient smokes 1-pack of cigarettes a day.   FAMILY HISTORY:  Consistent with hypertension.   Review of systems, as reviewed in the HPI.  She denies any syncope or  presyncope.   She denies any PND or orthopnea.  She denies any heat or  cold intolerance, weight gain, or weight loss.  All other systems were  reviewed and are negative.   PHYSICAL EXAMINATION:  GENERAL:  She is a middle-aged female, in no  acute distress.  VITAL SIGNS:  Her blood pressure is 95/60.  Her heart rate is 90.  HEENT:  carotids 2+.  She has no bruits.  No JVD.  NECK:  Supple.  LUNGS:  Clear.  HEART:  Regular rate, S1 and S2.  There are no gallops.  Her PMI is  nondisplaced.  ABDOMEN:  Good bowel sounds and is nontender.  She has no  hepatosplenomegaly.  EXTREMITIES:  She has no clubbing, cyanosis, or edema.  Her pulses are  2+.  NEUROLOGIC:  Nonfocal.  SKIN:  Warm and dry.  GAIT:  Not assessed.   Her EKG reveals normal sinus rhythm.  She has ST-segment elevation in  the anterior leads.   [name] presents with an anterior wall ST-segment elevation  myocardial infarction and  taken directly to the cath lab.  We briefly  discussed the risks, benefits, and options concerning heart  catheterization.  She understands and agrees to proceed.  We also  discussed these with her family member.      Vesta Mixer, M.D.  Electronically Signed     PJN/MEDQ  D:  12/12/2008  T:  12/12/2008  Job:  [id number]   cc:   Yates Decamp

## 2011-03-28 NOTE — Cardiovascular Report (Signed)
NAMEALANYA, VUKELICH NO.:  [id number]   MEDICAL RECORD NO.:  [id number]          PATIENT TYPE:  INP   LOCATION:  2906                         FACILITY:  [facility]   PHYSICIAN:  Vesta Mixer, M.D. DATE OF BIRTH:  05-27-1951   DATE OF PROCEDURE:  DATE OF DISCHARGE:                            CARDIAC CATHETERIZATION   [name] is a 60 year old female with history of cigarette smoking  and hypertension.  She is admitted for an acute ST-segment elevation  myocardial infarction that actually started 8-9 hours prior to her  arrival at [facility].  She was transferred from [facility] to [facility] for urgent  heart catheterization.  The procedure was left heart catheterization  with coronary angiography with PTCA and stenting of the left anterior  descending artery.   The right femoral artery was easily cannulated using a modified  Seldinger technique.   HEMODYNAMICS:  LV pressure was 95/19 with an aortic pressure of 99/67.   Angiography of left main, the left main coronary artery is fairly smooth  and normal.   The left anterior descending artery has 90-degree band in the proximal  segment and was occluded.  There were several small branching diagonals  with TIMI grade 1 flow just prior to this lesion.   The left circumflex artery is a moderate-sized vessel with mild luminal  irregularities.   The right coronary artery is moderate in size and is dominant.  There  were minor luminal irregularities.   The left ventriculogram, which was actually performed after the  angioplasty, reveals anteroapical and inferoapical akinesis.  Ejection  fraction is around 30%.  There is some dyskinesis in the apex.   PTCA of the left main was initially engaged using a Judkins left 3.5  guide.  We were able to place the guidewire and actually do some balloon  angioplasty, but we could not place the stent or the Fetch catheter.  Later in the case, we traded this out for CLS 3.5, which  provided better  backup.   Angiomax was given.  The ACT was 359.   A Prowater angioplasty wire was placed in, we established some antegrade  flow.  There was a large thrombus burden in the proximal LAD.  We  attempted to place the Fetch catheter on several occasions, but could  not cross the 90-degree band.  We performed several angioplasty using a  3.0 x 15-mm apex balloon, inflated up to 6 atmospheres and we still were  not able to use the Fetch catheter.   After the balloon inflations, the vessel was opened with TIMI grade II  to III flow.  At this point, a 3.0 x 18-mm VISION stent was positioned  down in the distal aspect of the lesion.  It was deployed at 12  atmospheres for 30 seconds.  This was followed by another 3.0 x 23-mm  VISION stent.  It was deployed at 14 atmospheres up to 20 seconds.   Post-stent dilatation was achieved using a VOYAGER  3.25 mm x 20 mm.  It was positioned distally and inflated up to 12 atmospheres for 20  seconds.  It was then positioned in the middle of the stents and was  then inflated up to 14 atmospheres for 23 seconds.  It was then pulled  back to the proximal edge of the stent and was inflated up to 16  atmospheres for 20 seconds.  This gave [place] a very nice angiographic  result.  There was a 0% residual stenosis.  The flow through the vessel  appears to improving and is probably between TIMI grade, TIMI II and  TIMI III flow.   These small branching diagonals that were seen on original angiograms  were occluded following the stent procedure.   The patient tolerated the procedure quite well.   COMPLICATIONS:  None.   CONCLUSIONS:  1. Large anteroapical myocardial infarction.  2. Successful PTCA and stenting of the left anterior descending      artery.  The patient presented quite laden in the course of her      anterior wall MI.  Hopefully, we have saved some of her myocardium.      She has a bare-metal stent.  We will need to have Plavix  for at      least a month, hopefully 6 months.      Vesta Mixer, M.D.  Electronically Signed     PJN/MEDQ  D:  12/12/2008  T:  12/13/2008  Job:  [id number]   cc:   Yates Decamp

## 2011-03-28 NOTE — Discharge Summary (Signed)
[name], [name] NO.:  [id number]   MEDICAL RECORD NO.:  [id number]          PATIENT TYPE:  INP   LOCATION:  2009                         FACILITY:  [facility]   PHYSICIAN:  Vesta Mixer, M.D. DATE OF BIRTH:  08-Dec-1950   DATE OF ADMISSION:  12/12/2008  DATE OF DISCHARGE:  12/16/2008                               DISCHARGE SUMMARY   DISCHARGE DIAGNOSES:  1. Anterior wall myocardial infarction - late presentation.  2. Status post percutaneous transluminal coronary angioplasty and      stenting of the left anterior descending using a 3.0 x 18 mm Vision      stent, postdilated using a 3.25 mm balloon.  3. Dyslipidemia.  4. Hypertension.  5. Transient atrial fibrillation.  6. History of cigarette smoking.   DISCHARGE MEDICATIONS:  1. Aspirin 81 mg a day.  2. Plavix 75 mg a day.  3. O2 two L/min nasal cannula at night.  4. Lasix 40 mg a day.  5. Potassium chloride 10 mEq a day.  6. Nitroglycerin 0.4 mg sublingually as needed for chest pain.  7. Carvedilol 12.5 mg twice a day.  8. Amiodarone 200 mg twice a day.  9. Lisinopril 5 mg a day.  10.Simvastatin 40 mg a day.  11.Albuterol HFA 2 puffs every 6 hours as needed.   DISPOSITION:  The patient will return to see Dr. Elease Hashimoto in 1-2 weeks.  She is to see Dr. Yates Decamp in 2-3 weeks.   HISTORY:  [name] is a 60 year old female from [place].  She was  admitted from [facility] after being seen in their emergency room with an  anterior wall myocardial infarction.  She did had chest pain for the  previous 8 hours.  Please see dictated H and P for further details.   HOSPITAL COURSE:  1. Anterior wall myocardial infarction.  The patient presented and was      found to have an occluded left anterior descending artery.  She      underwent successful PTCA and stenting using a 3.0 x 18 mm Vision      stent.  It was postdilated using a 3.25 mm Voyager [place].  She      tolerated the procedure quite well.  She did have  some mild      hypertension which required a very gradual increase in her      carvedilol and lisinopril.  She also had some transient atrial      fibrillation which resolved after starting initially diltiazem drip      and then later the amiodarone by mouth.  She overall made great      progress and was able to ambulate 350 feet without any significant      problems.  She has not had any recurrent chest pain.  She will      follow up with Dr. Elease Hashimoto.  2. Atrial fibrillation.  The patient had transient atrial fibrillation      a couple of nights.  She was started on amiodarone and maintained      sinus rhythm.  She will be  sent home on amiodarone.  Final tapering      over the next month or so.  3. Hyperlipidemia.  We will send her home on simvastatin.  Her LDL was      139.  We will check it as an outpatient.  4. Bronchospasm.  The patient had some transient bronchospasm.  We      will give her an albuterol to use at night.  She desaturated at      night for several nights.  Her O2 sat was 85% on room air.  We will      send her home on 2 L of nasal cannula oxygen to be given at night.      She will probably need this for a month or so.  All of her other      medical problems were stable.      Vesta Mixer, M.D.  Electronically Signed     PJN/MEDQ  D:  12/16/2008  T:  12/16/2008  Job:  [id number]   cc:   Yates Decamp

## 2011-04-04 ENCOUNTER — Encounter: Payer: Self-pay | Admitting: Cardiovascular Disease

## 2011-04-04 DIAGNOSIS — I5022 Chronic systolic (congestive) heart failure: Secondary | ICD-10-CM | POA: Insufficient documentation

## 2011-04-04 DIAGNOSIS — E785 Hyperlipidemia, unspecified: Secondary | ICD-10-CM | POA: Insufficient documentation

## 2011-04-04 DIAGNOSIS — IMO0002 Reserved for concepts with insufficient information to code with codable children: Secondary | ICD-10-CM | POA: Insufficient documentation

## 2011-04-06 ENCOUNTER — Encounter: Payer: Self-pay | Admitting: Cardiovascular Disease

## 2011-04-06 ENCOUNTER — Ambulatory Visit (INDEPENDENT_AMBULATORY_CARE_PROVIDER_SITE_OTHER): Payer: BC Managed Care – PPO | Admitting: Cardiovascular Disease

## 2011-04-06 ENCOUNTER — Other Ambulatory Visit: Payer: Self-pay | Admitting: Cardiology

## 2011-04-06 VITALS — BP 140/98 | HR 80 | Ht 69.0 in | Wt 236.0 lb

## 2011-04-06 DIAGNOSIS — E7800 Pure hypercholesterolemia, unspecified: Secondary | ICD-10-CM

## 2011-04-06 DIAGNOSIS — I2510 Atherosclerotic heart disease of native coronary artery without angina pectoris: Secondary | ICD-10-CM

## 2011-04-06 NOTE — Assessment & Plan Note (Signed)
[name]  is doing very well from a cardiac standpoint. She has not had any episodes of chest pain or shortness of breath. We'll continue with her same medications. I'll see her again in 6 months for office visit, lipid profile, basic metabolic profile, and hepatic profile.

## 2011-04-06 NOTE — Progress Notes (Signed)
[name] Date of Birth  03/27/1951 [facility] [address]     [address] [place], [place]  [address] [phone]  Fax  [phone]  History of Present Illness:  [name] is a middle aged female with a history of CAD - S/P stenting to her LAD in January 2010.  She has done well and denies any chest pain or dyspnea.  She exercises on a regular basis.  Current Outpatient Prescriptions on File Prior to Visit  Medication Sig Dispense Refill  . aspirin 81 MG tablet Take 81 mg by mouth daily.        . carvedilol (COREG) 12.5 MG tablet Take 25 mg by mouth 2 (two) times daily with a meal.       . clopidogrel (PLAVIX) 75 MG tablet Take 1 tablet (75 mg total) by mouth daily.  30 tablet  11  . furosemide (LASIX) 40 MG tablet Take 1 tablet (40 mg total) by mouth daily.  30 tablet  11  . lisinopril (PRINIVIL,ZESTRIL) 5 MG tablet Take 1 tablet (5 mg total) by mouth daily.  30 tablet  11  . potassium chloride (KLOR-CON) 10 MEQ CR tablet Take 10 mEq by mouth daily.        . simvastatin (ZOCOR) 40 MG tablet Take 1 tablet (40 mg total) by mouth every evening.  30 tablet  11  . warfarin (COUMADIN) 5 MG tablet Take 5 mg by mouth as directed.        Marland Kitchen DISCONTD: albuterol (PROVENTIL) (2.5 MG/3ML) 0.083% nebulizer solution Take 2.5 mg by nebulization every 6 (six) hours as needed.        Marland Kitchen DISCONTD: nitroGLYCERIN (NITROSTAT) 0.4 MG SL tablet Place 0.4 mg under the tongue every 5 (five) minutes as needed.          No Known Allergies  Past Medical History  Diagnosis Date  . Coronary artery disease 12/12/2008    STATUS POST ANTERIOR WALL MYOCARDIAL INFARCTION. STATUS POST PTCA AND STENTING OF HER LAD  . CHF (congestive heart failure)     EF 30-35% BY ECHO. SHE HAS AN APICAL ANEURYSM  . Transient atrial fibrillation or flutter   . Dyslipidemia     Past Surgical History  Procedure Date  . Cardiac defibrillator placement     History  Smoking status  .  Former Smoker  . Quit date: 12/14/2008  Smokeless tobacco  . Not on file    History  Alcohol Use No    History reviewed. No pertinent family history.  Reviw of Systems:  Reviewed in the HPI.  All other systems are negative.  Physical Exam: BP 140/98  Pulse 80  Ht 5\' 9"  (1.753 m)  Wt 236 lb (107.049 kg)  BMI 34.85 kg/m2 The patient is alert and oriented x 3.  The mood and affect are normal.  The skin is warm and dry.  Color is normal.  The HEENT exam reveals that the sclera are nonicteric.  The mucous membranes are moist.  The carotids are 2+ without bruits.  There is no thyromegaly.  There is no JVD.  The lungs are clear.  The chest wall is non tender.  The heart exam reveals a regular rate with a normal S1 and S2.  There are no murmurs, gallops, or rubs.  The PMI is not displaced.   Abdominal exam reveals good bowel sounds.  There is no guarding or rebound.  There is no hepatosplenomegaly or tenderness.  There are no masses.  Exam of the legs reveal no clubbing, cyanosis, or edema.  The legs are without rashes.  The distal pulses are intact.  Cranial nerves II - XII are intact.  Motor and sensory functions are intact.  The gait is normal.  Assessment / Plan:

## 2011-04-07 ENCOUNTER — Other Ambulatory Visit: Payer: Self-pay | Admitting: *Deleted

## 2011-04-07 MED ORDER — WARFARIN SODIUM 5 MG PO TABS: 5.0000 mg | ORAL_TABLET | ORAL | Status: DC

## 2011-04-07 NOTE — Telephone Encounter (Signed)
Called again no answer using my cell, need to know who is doing inr, pt called and was upset per yelena that is wasn't filled yet. Refill sent with not to call office and to ask for me.Alfonso Ramus RN

## 2011-04-07 NOTE — Telephone Encounter (Signed)
Tried to call again, unable to leave msg.Alfonso Ramus RN

## 2011-04-07 NOTE — Telephone Encounter (Signed)
Pt taking coumadin/ confirmed with pharmacy but no inr recorded, trying to contct pt to see who is dosing.

## 2011-04-11 ENCOUNTER — Telehealth: Payer: Self-pay | Admitting: *Deleted

## 2011-04-20 ENCOUNTER — Telehealth: Payer: Self-pay | Admitting: *Deleted

## 2011-04-20 NOTE — Telephone Encounter (Signed)
Attempt to contact pt, cell not here number, home unable to leave msg, need to know how is checking her pt/inr. Will place note on chart and mail a note.Alfonso Ramus RN

## 2011-04-20 NOTE — Telephone Encounter (Signed)
Mailed note to call us and update her phone contacts and to inform us of who is checking her coumadin/pt inr.Alfonso Ramus RN

## 2011-04-21 ENCOUNTER — Ambulatory Visit (INDEPENDENT_AMBULATORY_CARE_PROVIDER_SITE_OTHER): Payer: BC Managed Care – PPO | Admitting: *Deleted

## 2011-04-21 DIAGNOSIS — I4891 Unspecified atrial fibrillation: Secondary | ICD-10-CM

## 2011-04-21 LAB — POCT INR: INR: 3.4

## 2011-04-21 NOTE — Telephone Encounter (Signed)
Pt returned call and app set.

## 2011-05-06 ENCOUNTER — Other Ambulatory Visit: Payer: Self-pay | Admitting: Cardiovascular Disease

## 2011-05-08 ENCOUNTER — Other Ambulatory Visit: Payer: Self-pay | Admitting: *Deleted

## 2011-05-08 MED ORDER — POTASSIUM CHLORIDE 10 MEQ PO TBCR: 10.0000 meq | EXTENDED_RELEASE_TABLET | Freq: Every day | ORAL | Status: DC

## 2011-05-08 NOTE — Telephone Encounter (Signed)
Fax received from pharmacy. Refill completed. Jodette Donnielle Addison RN  

## 2011-05-19 ENCOUNTER — Telehealth: Payer: Self-pay | Admitting: *Deleted

## 2011-05-19 ENCOUNTER — Ambulatory Visit (INDEPENDENT_AMBULATORY_CARE_PROVIDER_SITE_OTHER): Payer: BC Managed Care – PPO | Admitting: *Deleted

## 2011-05-19 DIAGNOSIS — I4891 Unspecified atrial fibrillation: Secondary | ICD-10-CM

## 2011-05-19 LAB — POCT INR: INR: 2.9

## 2011-05-19 NOTE — Telephone Encounter (Signed)
[name] in coumadin clinic today saw [name] and [name] declines return for monthly INR will only come in for checks every 6 months due to financial hardship. [name] wants off coumadin and wants advise. Dr Na consulted and he is willing to switch her to xeralto 20mg  qd or pradaxa 150mg  bid. [name] called and informed, I called her pharmacy and they cant give me prices of the meds, [name] informed to call her insurance and get price and call me back and coumadin can be stopped. New drug wont need lab work, [name] verbalized understanding. Alfonso Ramus RN

## 2011-06-07 ENCOUNTER — Other Ambulatory Visit: Payer: Self-pay | Admitting: Cardiovascular Disease

## 2011-06-07 NOTE — Telephone Encounter (Signed)
No [facility] [address]/ pt called and reconfirmed/ script filled

## 2011-06-07 NOTE — Telephone Encounter (Signed)
PT NEEDS REFILL ON WARFARIN - USES [facility] [address]

## 2011-06-16 ENCOUNTER — Other Ambulatory Visit: Payer: Self-pay | Admitting: *Deleted

## 2011-06-16 ENCOUNTER — Ambulatory Visit (INDEPENDENT_AMBULATORY_CARE_PROVIDER_SITE_OTHER): Payer: BC Managed Care – PPO | Admitting: *Deleted

## 2011-06-16 DIAGNOSIS — I4891 Unspecified atrial fibrillation: Secondary | ICD-10-CM

## 2011-06-16 DIAGNOSIS — I2589 Other forms of chronic ischemic heart disease: Secondary | ICD-10-CM

## 2011-06-16 LAB — POCT INR: INR: 3

## 2011-07-11 ENCOUNTER — Encounter: Payer: Self-pay | Admitting: *Deleted

## 2011-07-14 ENCOUNTER — Ambulatory Visit (INDEPENDENT_AMBULATORY_CARE_PROVIDER_SITE_OTHER): Payer: BC Managed Care – PPO | Admitting: *Deleted

## 2011-07-14 DIAGNOSIS — I4891 Unspecified atrial fibrillation: Secondary | ICD-10-CM

## 2011-08-07 ENCOUNTER — Other Ambulatory Visit: Payer: Self-pay | Admitting: Cardiovascular Disease

## 2011-08-11 ENCOUNTER — Ambulatory Visit (INDEPENDENT_AMBULATORY_CARE_PROVIDER_SITE_OTHER): Payer: BC Managed Care – PPO | Admitting: *Deleted

## 2011-08-11 DIAGNOSIS — I4891 Unspecified atrial fibrillation: Secondary | ICD-10-CM

## 2011-08-30 ENCOUNTER — Ambulatory Visit (INDEPENDENT_AMBULATORY_CARE_PROVIDER_SITE_OTHER): Payer: BC Managed Care – PPO | Admitting: Internal Medicine

## 2011-08-30 ENCOUNTER — Encounter: Payer: Self-pay | Admitting: Internal Medicine

## 2011-08-30 DIAGNOSIS — I5022 Chronic systolic (congestive) heart failure: Secondary | ICD-10-CM

## 2011-08-30 DIAGNOSIS — Z95810 Presence of automatic (implantable) cardiac defibrillator: Secondary | ICD-10-CM

## 2011-08-30 DIAGNOSIS — I2510 Atherosclerotic heart disease of native coronary artery without angina pectoris: Secondary | ICD-10-CM

## 2011-08-30 DIAGNOSIS — I2589 Other forms of chronic ischemic heart disease: Secondary | ICD-10-CM

## 2011-08-30 LAB — ICD DEVICE OBSERVATION
BATTERY VOLTAGE: 3.1747 V
BRDY-0002RV: 40 {beats}/min
CHARGE TIME: 8.438 s
RV LEAD AMPLITUDE: 9 mv
RV LEAD IMPEDENCE ICD: 494 Ohm
RV LEAD THRESHOLD: 0.75 V
TOT-0006: 20110627000000
TZAT-0002FASTVT: NEGATIVE
TZAT-0002SLOWVT: NEGATIVE
TZAT-0018SLOWVT: NEGATIVE
TZAT-0019SLOWVT: 8 V
TZON-0003VSLOWVT: 400 ms
TZON-0004SLOWVT: 16
TZON-0004VSLOWVT: 28
TZON-0005SLOWVT: 12
TZST-0001FASTVT: 3
TZST-0001FASTVT: 5
TZST-0001SLOWVT: 2
TZST-0001SLOWVT: 3
TZST-0001SLOWVT: 6
TZST-0002FASTVT: NEGATIVE
TZST-0002FASTVT: NEGATIVE
TZST-0002FASTVT: NEGATIVE
TZST-0002SLOWVT: NEGATIVE
TZST-0002SLOWVT: NEGATIVE
TZST-0002SLOWVT: NEGATIVE

## 2011-08-30 NOTE — Assessment & Plan Note (Signed)
Her device is working normally. We'll plan to recheck in several months. No ICD shocks.

## 2011-08-30 NOTE — Progress Notes (Signed)
HPI [name] returns today for followup. She is a very pleasant 60 year old woman with an ischemic cardiomyopathy, chronic class II systolic heart failure, status post ICD implantation. Since her implant, the patient has done well. She is exercising daily. She denies chest pain or shortness of breath or peripheral edema, she has had no ICD shocks. She denies noncompliance. No Known Allergies   Current Outpatient Prescriptions  Medication Sig Dispense Refill  . aspirin 81 MG tablet Take 81 mg by mouth daily.        . carvedilol (COREG) 25 MG tablet Take 12.5 mg by mouth 2 (two) times daily with a meal.       . clopidogrel (PLAVIX) 75 MG tablet Take 1 tablet (75 mg total) by mouth daily.  30 tablet  11  . furosemide (LASIX) 40 MG tablet Take 1 tablet (40 mg total) by mouth daily.  30 tablet  11  . lisinopril (PRINIVIL,ZESTRIL) 5 MG tablet Take 1 tablet (5 mg total) by mouth daily.  30 tablet  11  . potassium chloride (KLOR-CON) 10 MEQ CR tablet Take 1 tablet (10 mEq total) by mouth daily.  30 tablet  5  . simvastatin (ZOCOR) 40 MG tablet Take 1 tablet (40 mg total) by mouth every evening.  30 tablet  11  . warfarin (COUMADIN) 5 MG tablet TAKE ONE TABLET BY MOUTH AS DIRECTED  30 tablet  3     Past Medical History  Diagnosis Date  . Coronary artery disease 12/12/2008    STATUS POST ANTERIOR WALL MYOCARDIAL INFARCTION. STATUS POST PTCA AND STENTING OF HER LAD  . CHF (congestive heart failure)     EF 30-35% BY ECHO. SHE HAS AN APICAL ANEURYSM  . Transient atrial fibrillation or flutter   . Dyslipidemia     ROS:   All systems reviewed and negative except as noted in the HPI.   Past Surgical History  Procedure Date  . Cardiac defibrillator placement      History reviewed. No pertinent family history.   History   Social History  . Marital Status: Married    Spouse Name: N/A    Number of Children: N/A  . Years of Education: N/A   Occupational History  . Not on file.    Social History Main Topics  . Smoking status: Former Smoker    Quit date: 12/14/2008  . Smokeless tobacco: Not on file  . Alcohol Use: No  . Drug Use: No  . Sexually Active:    Other Topics Concern  . Not on file   Social History Narrative  . No narrative on file     BP 122/72  Pulse 76  Ht 6' (1.829 m)  Wt 240 lb (108.863 kg)  BMI 32.55 kg/m2  Physical Exam:  Well appearing middle-aged woman, NAD HEENT: Unremarkable Neck:  No JVD, no thyromegally Lymphatics:  No adenopathy Back:  No CVA tenderness Lungs:  Clear with no wheezes, rales, or rhonchi. Well-healed ICD incision. HEART:  Regular rate rhythm, no murmurs, no rubs, no clicks Abd:  soft, positive bowel sounds, no organomegally, no rebound, no guarding Ext:  2 plus pulses, no edema, no cyanosis, no clubbing Skin:  No rashes no nodules Neuro:  CN II through XII intact, motor grossly intact  DEVICE  Normal device function.  See PaceArt for details.   Assess/Plan:

## 2011-08-30 NOTE — Assessment & Plan Note (Signed)
She denies anginal symptoms despite exercising regularly. She will continue her current medical therapy.

## 2011-08-30 NOTE — Assessment & Plan Note (Signed)
Her symptoms are well compensated. She will continue her current medical therapy and maintain a low-sodium diet.

## 2011-08-30 NOTE — Patient Instructions (Signed)
Your physician recommends that you schedule a follow-up appointment with Dr. Elease Hashimoto in 1 month.  Your physician recommends that you schedule a follow-up appointment in: 6 months with [facility].

## 2011-08-31 ENCOUNTER — Encounter: Payer: Self-pay | Admitting: *Deleted

## 2011-09-08 ENCOUNTER — Ambulatory Visit (INDEPENDENT_AMBULATORY_CARE_PROVIDER_SITE_OTHER): Payer: BC Managed Care – PPO | Admitting: *Deleted

## 2011-09-08 DIAGNOSIS — I4891 Unspecified atrial fibrillation: Secondary | ICD-10-CM

## 2011-09-23 ENCOUNTER — Telehealth: Payer: Self-pay | Admitting: Adult Health

## 2011-09-23 NOTE — Telephone Encounter (Signed)
Contacted by Dr. Wilnette Kales in [place], [place] concerning patient. She presented there with ICD DC x 2. Device interrogated and she was shocked for atrial fibrillation. She is being admitted to initiate either tikosyn or amiodarone to suppress atrial fibrillation. Troponin also mildly elevated and she may also require ischemia eval. [name]

## 2011-10-02 ENCOUNTER — Encounter: Payer: Self-pay | Admitting: Cardiovascular Disease

## 2011-10-02 ENCOUNTER — Ambulatory Visit (INDEPENDENT_AMBULATORY_CARE_PROVIDER_SITE_OTHER): Payer: BC Managed Care – PPO | Admitting: Cardiovascular Disease

## 2011-10-02 DIAGNOSIS — Z95810 Presence of automatic (implantable) cardiac defibrillator: Secondary | ICD-10-CM

## 2011-10-02 DIAGNOSIS — I4891 Unspecified atrial fibrillation: Secondary | ICD-10-CM

## 2011-10-02 DIAGNOSIS — I2510 Atherosclerotic heart disease of native coronary artery without angina pectoris: Secondary | ICD-10-CM

## 2011-10-02 DIAGNOSIS — I5022 Chronic systolic (congestive) heart failure: Secondary | ICD-10-CM

## 2011-10-02 DIAGNOSIS — Z7901 Long term (current) use of anticoagulants: Secondary | ICD-10-CM

## 2011-10-02 NOTE — Patient Instructions (Signed)
Your INR is 1.9 continue your same dosage of coumadin.  No medication changes were made.  Your physician recommends that you schedule a follow-up appointment in: 6 months

## 2011-10-02 NOTE — Assessment & Plan Note (Signed)
She's not having any severe symptoms of shortness of breath.  We'll continue with her same dose of Lasix, carvedilol, and lisinopril.

## 2011-10-02 NOTE — Assessment & Plan Note (Addendum)
The patient had an AICD discharge when she was in [place]. She was admitted overnight to [facility]. She was told her that the bladder was "not programmed correctly".  She was started on amiodarone at that time. Her Coumadin dose was not adjusted. She is scheduled to decrease her Coumadin dose to 200 mg once a day in 2-3 weeks.  We'll try to get the records from [place]. She's doing quite well so for now we'll continue with the amiodarone. After reviewing the records we may need to have her come back in to get her defibrillator checked by the device clinic. She is not inclined to come back any earlier than  needed since she has just had her defibrillator checked recently in [place].  We will review the records with Dr. Ladona Ridgel and will make sure that her ICD is adjusted properly.

## 2011-10-02 NOTE — Progress Notes (Signed)
[name] Date of Birth  1951/07/05 [facility] [address]    [address] [place], [place]  [address] [phone]  Fax  [phone]  History of Present Illness:  [name] is a 60 year old female with a history of coronary artery disease. She has had an large anterior wall myocardial infarction and has congestive heart failure. Her ejection fraction is between 30 and 35%.   She has an AICD.  She was recently down in [place] visiting her son. Her AICD fired while she was taking a shower. She was shocked 2 times. She was admitted overnight to [facility]. She was started on amiodarone. She feels much better. She's not had any AICD discharges since that time.   She has continued with her same dose of Coumadin. She has not noticed any bleeding.  Current Outpatient Prescriptions  Medication Sig Dispense Refill  . amiodarone (PACERONE) 200 MG tablet Take 200 mg by mouth 2 (two) times daily. Take one tablet twice a day for 3 weeks. Then one (200 mg) tablet daily.       Marland Kitchen aspirin 81 MG tablet Take 81 mg by mouth daily.        . carvedilol (COREG) 25 MG tablet Take 25 mg by mouth 2 (two) times daily with a meal.        . clopidogrel (PLAVIX) 75 MG tablet Take 1 tablet (75 mg total) by mouth daily.  30 tablet  11  . furosemide (LASIX) 40 MG tablet Take 1 tablet (40 mg total) by mouth daily.  30 tablet  11  . lisinopril (PRINIVIL,ZESTRIL) 10 MG tablet Take 10 mg by mouth daily.        . potassium chloride (KLOR-CON) 10 MEQ CR tablet Take 1 tablet (10 mEq total) by mouth daily.  30 tablet  5  . simvastatin (ZOCOR) 40 MG tablet Take 1 tablet (40 mg total) by mouth every evening.  30 tablet  11  . warfarin (COUMADIN) 5 MG tablet TAKE ONE TABLET BY MOUTH AS DIRECTED  30 tablet  3     No Known Allergies  Past Medical History  Diagnosis Date  . Coronary artery disease 12/12/2008    STATUS POST ANTERIOR WALL MYOCARDIAL INFARCTION. STATUS POST PTCA AND STENTING OF HER LAD   . CHF (congestive heart failure)     EF 30-35% BY ECHO. SHE HAS AN APICAL ANEURYSM  . Transient atrial fibrillation or flutter   . Dyslipidemia     Past Surgical History  Procedure Date  . Cardiac defibrillator placement     History  Smoking status  . Former Smoker  . Quit date: 12/14/2008  Smokeless tobacco  . Not on file    History  Alcohol Use No    History reviewed. No pertinent family history.  Reviw of Systems:  Reviewed in Davis HPI.  All other systems are negative.  Physical Exam: BP 124/82  Pulse 77  Ht 5\' 11"  (1.803 m)  Wt 236 lb (107.049 kg)  BMI 32.92 kg/m2 Davis patient is alert and oriented x 3.  Davis mood and affect are normal.   Skin: warm and dry.  Color is normal.    HEENT:   Vander/ AT, normal carotids, no JVD, neck is supple  Lungs: clear   Heart:  RR, no murmurs    Abdomen: soft, no HSM, normal BS  Extremities:  No cords, no c/c/e  Neuro:  Non focal    ECG: NSR, old anterior septal MI  Assessment / Plan:

## 2011-10-09 ENCOUNTER — Ambulatory Visit: Payer: Self-pay | Admitting: Internal Medicine

## 2011-10-20 ENCOUNTER — Ambulatory Visit (INDEPENDENT_AMBULATORY_CARE_PROVIDER_SITE_OTHER): Payer: BC Managed Care – PPO | Admitting: *Deleted

## 2011-10-20 DIAGNOSIS — I4891 Unspecified atrial fibrillation: Secondary | ICD-10-CM

## 2011-10-20 LAB — POCT INR: INR: 3.2

## 2011-11-01 ENCOUNTER — Other Ambulatory Visit: Payer: Self-pay | Admitting: Cardiovascular Disease

## 2011-11-01 ENCOUNTER — Other Ambulatory Visit: Payer: Self-pay | Admitting: *Deleted

## 2011-11-03 ENCOUNTER — Ambulatory Visit (INDEPENDENT_AMBULATORY_CARE_PROVIDER_SITE_OTHER): Payer: BC Managed Care – PPO | Admitting: *Deleted

## 2011-11-03 DIAGNOSIS — I4891 Unspecified atrial fibrillation: Secondary | ICD-10-CM

## 2011-11-03 LAB — POCT INR: INR: 2.6

## 2011-11-08 ENCOUNTER — Telehealth: Payer: Self-pay | Admitting: Cardiovascular Disease

## 2011-11-08 ENCOUNTER — Encounter: Payer: Self-pay | Admitting: Internal Medicine

## 2011-11-08 ENCOUNTER — Ambulatory Visit (INDEPENDENT_AMBULATORY_CARE_PROVIDER_SITE_OTHER): Payer: BC Managed Care – PPO | Admitting: *Deleted

## 2011-11-08 DIAGNOSIS — I2589 Other forms of chronic ischemic heart disease: Secondary | ICD-10-CM

## 2011-11-08 DIAGNOSIS — I509 Heart failure, unspecified: Secondary | ICD-10-CM

## 2011-11-08 LAB — ICD DEVICE OBSERVATION
BATTERY VOLTAGE: 3.1611 V
BRDY-0002RV: 40 {beats}/min
FVT: 0
PACEART VT: 0
TOT-0006: 20110627000000
TZAT-0001SLOWVT: 1
TZAT-0002FASTVT: NEGATIVE
TZAT-0018SLOWVT: NEGATIVE
TZAT-0019FASTVT: 8 V
TZAT-0019SLOWVT: 8 V
TZAT-0020SLOWVT: 1.5 ms
TZON-0003VSLOWVT: 400 ms
TZON-0005SLOWVT: 12
TZST-0001FASTVT: 3
TZST-0001FASTVT: 5
TZST-0001SLOWVT: 2
TZST-0001SLOWVT: 5
TZST-0002FASTVT: NEGATIVE
TZST-0002FASTVT: NEGATIVE
TZST-0002FASTVT: NEGATIVE
TZST-0003SLOWVT: 35 J
VF: 0

## 2011-11-08 NOTE — Telephone Encounter (Signed)
Patient seen in office today for ICD check and will follow with Dr. Elease Hashimoto 11/09/11.

## 2011-11-08 NOTE — Telephone Encounter (Signed)
New Msg: Pt daughter calling stating that pt was rushed to hospital about one month ago b/c pt defib went off. Pt is experiencing similar symptoms as that instance, i.e. Back pain and wanted to be advised as to how to proceed; if pt should go to hospital or not. Please return pt call to discuss further.

## 2011-11-08 NOTE — Progress Notes (Signed)
ICD check with ICM 

## 2011-11-09 ENCOUNTER — Encounter: Payer: Self-pay | Admitting: Cardiovascular Disease

## 2011-11-09 ENCOUNTER — Ambulatory Visit (INDEPENDENT_AMBULATORY_CARE_PROVIDER_SITE_OTHER): Payer: BC Managed Care – PPO | Admitting: Cardiovascular Disease

## 2011-11-09 VITALS — BP 154/93 | HR 71 | Ht 71.0 in | Wt 231.8 lb

## 2011-11-09 DIAGNOSIS — I5022 Chronic systolic (congestive) heart failure: Secondary | ICD-10-CM

## 2011-11-09 MED ORDER — AMIODARONE HCL 200 MG PO TABS: 200.0000 mg | ORAL_TABLET | Freq: Every day | ORAL | Status: DC

## 2011-11-09 NOTE — Progress Notes (Signed)
Melina Modena Date of Birth  09/26/1951 [facility] [address]    [address] [place], [place]  [address] [phone]  Fax  [phone]  [facility] [address] [place], [place]  [address] [phone]   Fax [phone]  History of Present Illness:  Senia is a 60 year old female with a history of coronary artery disease. She status post anterior wall myocardial infarction. She has congestive heart failure with an ejection fraction of around 25%.  She was recently in [place] and had an AICD shock. She was seen in the [facility]. She was told she had an inappropriate shock.  She apparently had transient atrial fibrillation with a rapid ventricular response. The ICD was reprogrammed.  She had an ICD interrogation yesterday and was told that she had some volume overload.  He admits to eating some extra salt over  the holidays. She's been eating ham.  She denies any chest pain or shortness of breath.  Current Outpatient Prescriptions on File Prior to Visit  Medication Sig Dispense Refill  . amiodarone (PACERONE) 200 MG tablet Take 200 mg by mouth 2 (two) times daily. Take one tablet twice a day for 3 weeks. Then one (200 mg) tablet daily.       Marland Kitchen aspirin 81 MG tablet Take 81 mg by mouth daily.        . carvedilol (COREG) 25 MG tablet Take 25 mg by mouth 2 (two) times daily with a meal.        . clopidogrel (PLAVIX) 75 MG tablet Take 1 tablet (75 mg total) by mouth daily.  30 tablet  11  . furosemide (LASIX) 40 MG tablet Take 1 tablet (40 mg total) by mouth daily.  30 tablet  11  . lisinopril (PRINIVIL,ZESTRIL) 10 MG tablet Take 10 mg by mouth daily.        . potassium chloride (K-DUR) 10 MEQ tablet TAKE ONE TABLET BY MOUTH EVERY DAY  30 tablet  6  . simvastatin (ZOCOR) 40 MG tablet Take 1 tablet (40 mg total) by mouth every evening.  30 tablet  11  . warfarin (COUMADIN) 5 MG tablet TAKE ONE TABLET BY MOUTH AS DIRECTED  30 tablet  3    No  Known Allergies  Past Medical History  Diagnosis Date  . Coronary artery disease 12/12/2008    STATUS POST ANTERIOR WALL MYOCARDIAL INFARCTION. STATUS POST PTCA AND STENTING OF HER LAD  . CHF (congestive heart failure)     EF 30-35% BY ECHO. SHE HAS AN APICAL ANEURYSM  . Transient atrial fibrillation or flutter   . Dyslipidemia     Past Surgical History  Procedure Date  . Cardiac defibrillator placement     History  Smoking status  . Former Smoker  . Quit date: 12/14/2008  Smokeless tobacco  . Not on file    History  Alcohol Use No    No family history on file.  Reviw of Systems:  Reviewed in the HPI.  All other systems are negative.  Physical Exam: BP 154/93  Pulse 71  Ht 5\' 11"  (1.803 m)  Wt 231 lb 12.8 oz (105.144 kg)  BMI 32.33 kg/m2 The patient is alert and oriented x 3.  The mood and affect are normal.   Skin: warm and dry.  Color is normal.    HEENT:   Normocephalic/atraumatic. Her neck is supple. There is no JVD.  Lungs: Lungs are clear to auscultation.   Heart: Regular rate  S1-S2.    Abdomen: Shows good bowel sounds. No tenderness  Extremities:  No clubbing cyanosis or edema.  Neuro:  There exam is nonfocal.    ECG: Normal sinus rhythm.  The ICD interrogation reveals that for volume is somewhat increased using the optimal fluid index. She's not had any therapies since the counters will reset on 09/23/2011.  Assessment / Plan:

## 2011-11-09 NOTE — Assessment & Plan Note (Signed)
[name] presents with some worsening congestive heart failure.   Her ejection fraction is between 25 and 30%. This was verified by Optivol interrogation yesterday. She admits to eating some extra salt recently.  She'll cut back on her salt intake. We will have her come back in one month for an Optivol interrogation. I'll see her in 3 months for an office visit and basic metabolic profile.

## 2011-11-09 NOTE — Assessment & Plan Note (Signed)
She had atrial fibrillation when she was in [place]. His cause her to have a rapid ventricular response which resulted in an appropriate AICD discharge. We'll continue with her amiodarone. I anticipate decreasing her amiodarone down to 3 times a week when I see her again

## 2011-11-09 NOTE — Patient Instructions (Addendum)
Your physician has recommended you make the following change in your medication: amiodarone 200 mg daily   ICD check in 1 month with optivol check [place] office  Your physician recommends that you schedule a follow-up appointment in: 3 months with labwork bmet,bnp

## 2011-11-15 ENCOUNTER — Telehealth: Payer: Self-pay | Admitting: Cardiovascular Disease

## 2011-11-15 NOTE — Telephone Encounter (Signed)
No note required.

## 2011-11-16 NOTE — Progress Notes (Signed)
Addended by: Andrey Cota A on: 11/16/2011 08:44 AM   Modules accepted: Orders

## 2011-11-18 ENCOUNTER — Telehealth: Payer: Self-pay | Admitting: Physician Assistant

## 2011-11-18 NOTE — Telephone Encounter (Signed)
Pt's husband called re: his wife's amiodarone dose, and was confused as to what exactly she should be taking. I reviewed most recent OV note by Dr Elease Hashimoto, 11/09/11, which documented plan to decrease Amio dose to 200 mg daily. Pt's husband was reassured that this was the current recommended dose for his wife, and understood to have her begin reducing the dose today.

## 2011-11-30 ENCOUNTER — Ambulatory Visit (INDEPENDENT_AMBULATORY_CARE_PROVIDER_SITE_OTHER): Payer: BC Managed Care – PPO | Admitting: *Deleted

## 2011-11-30 ENCOUNTER — Encounter: Payer: Self-pay | Admitting: Internal Medicine

## 2011-11-30 DIAGNOSIS — Z95810 Presence of automatic (implantable) cardiac defibrillator: Secondary | ICD-10-CM

## 2011-11-30 DIAGNOSIS — I509 Heart failure, unspecified: Secondary | ICD-10-CM

## 2011-11-30 DIAGNOSIS — I4891 Unspecified atrial fibrillation: Secondary | ICD-10-CM

## 2011-11-30 DIAGNOSIS — I2589 Other forms of chronic ischemic heart disease: Secondary | ICD-10-CM

## 2011-11-30 LAB — ICD DEVICE OBSERVATION
CHARGE TIME: 8.658 s
DEV-0020ICD: NEGATIVE
RV LEAD AMPLITUDE: 13.25 mv
RV LEAD IMPEDENCE ICD: 551 Ohm
RV LEAD THRESHOLD: 0.5 V
TOT-0001: 3
TOT-0002: 0
TZAT-0001FASTVT: 1
TZAT-0004SLOWVT: 8
TZAT-0012SLOWVT: 200 ms
TZAT-0013SLOWVT: 3
TZAT-0020SLOWVT: 1.5 ms
TZON-0003SLOWVT: 320 ms
TZON-0003VSLOWVT: 400 ms
TZON-0004SLOWVT: 16
TZON-0004VSLOWVT: 28
TZON-0005SLOWVT: 12
TZST-0001FASTVT: 2
TZST-0001FASTVT: 3
TZST-0001FASTVT: 4
TZST-0001SLOWVT: 4
TZST-0001SLOWVT: 5
TZST-0002FASTVT: NEGATIVE
TZST-0002FASTVT: NEGATIVE
TZST-0003SLOWVT: 35 J
TZST-0003SLOWVT: 35 J
VF: 0

## 2011-11-30 NOTE — Progress Notes (Signed)
icd check in clinic  

## 2011-12-11 ENCOUNTER — Other Ambulatory Visit: Payer: BC Managed Care – PPO | Admitting: *Deleted

## 2011-12-29 ENCOUNTER — Ambulatory Visit (INDEPENDENT_AMBULATORY_CARE_PROVIDER_SITE_OTHER): Payer: BC Managed Care – PPO | Admitting: *Deleted

## 2011-12-29 ENCOUNTER — Encounter: Payer: BC Managed Care – PPO | Admitting: *Deleted

## 2011-12-29 ENCOUNTER — Other Ambulatory Visit: Payer: Self-pay | Admitting: Cardiovascular Disease

## 2011-12-29 DIAGNOSIS — I4891 Unspecified atrial fibrillation: Secondary | ICD-10-CM

## 2011-12-29 LAB — POCT INR: INR: 3.9

## 2011-12-29 NOTE — Progress Notes (Signed)
Needs follow up date

## 2012-01-21 ENCOUNTER — Other Ambulatory Visit: Payer: Self-pay | Admitting: Cardiovascular Disease

## 2012-01-26 ENCOUNTER — Other Ambulatory Visit: Payer: Self-pay | Admitting: Cardiovascular Disease

## 2012-01-26 ENCOUNTER — Ambulatory Visit (INDEPENDENT_AMBULATORY_CARE_PROVIDER_SITE_OTHER): Payer: BC Managed Care – PPO | Admitting: *Deleted

## 2012-01-26 DIAGNOSIS — I4891 Unspecified atrial fibrillation: Secondary | ICD-10-CM

## 2012-01-26 LAB — POCT INR: INR: 2.6

## 2012-02-02 ENCOUNTER — Other Ambulatory Visit: Payer: Self-pay | Admitting: Cardiovascular Disease

## 2012-02-08 ENCOUNTER — Other Ambulatory Visit: Payer: BC Managed Care – PPO

## 2012-02-28 ENCOUNTER — Encounter: Payer: Self-pay | Admitting: Internal Medicine

## 2012-02-28 ENCOUNTER — Ambulatory Visit (INDEPENDENT_AMBULATORY_CARE_PROVIDER_SITE_OTHER): Payer: BC Managed Care – PPO | Admitting: Pharmacist

## 2012-02-28 ENCOUNTER — Ambulatory Visit (INDEPENDENT_AMBULATORY_CARE_PROVIDER_SITE_OTHER): Payer: BC Managed Care – PPO | Admitting: *Deleted

## 2012-02-28 DIAGNOSIS — I509 Heart failure, unspecified: Secondary | ICD-10-CM

## 2012-02-28 DIAGNOSIS — I4891 Unspecified atrial fibrillation: Secondary | ICD-10-CM

## 2012-02-28 DIAGNOSIS — I5022 Chronic systolic (congestive) heart failure: Secondary | ICD-10-CM

## 2012-02-28 LAB — ICD DEVICE OBSERVATION
BATTERY VOLTAGE: 3.1611 V
BRDY-0002RV: 40 {beats}/min
CHARGE TIME: 8.658 s
FVT: 0
RV LEAD AMPLITUDE: 12.125 mv
RV LEAD IMPEDENCE ICD: 532 Ohm
RV LEAD THRESHOLD: 0.75 V
TOT-0001: 3
TZAT-0001FASTVT: 1
TZAT-0001SLOWVT: 1
TZAT-0002FASTVT: NEGATIVE
TZAT-0012FASTVT: 200 ms
TZAT-0012SLOWVT: 200 ms
TZAT-0013SLOWVT: 3
TZAT-0018FASTVT: NEGATIVE
TZAT-0019SLOWVT: 8 V
TZAT-0020SLOWVT: 1.5 ms
TZON-0003SLOWVT: 320 ms
TZON-0004SLOWVT: 16
TZST-0001FASTVT: 2
TZST-0001FASTVT: 4
TZST-0001FASTVT: 6
TZST-0001SLOWVT: 2
TZST-0001SLOWVT: 4
TZST-0001SLOWVT: 6
TZST-0002FASTVT: NEGATIVE
TZST-0002FASTVT: NEGATIVE
TZST-0002FASTVT: NEGATIVE
TZST-0003SLOWVT: 35 J
TZST-0003SLOWVT: 35 J
VF: 0

## 2012-02-28 LAB — POCT INR: INR: 2.3

## 2012-02-28 NOTE — Progress Notes (Signed)
ICD check with ICM 

## 2012-03-27 ENCOUNTER — Ambulatory Visit (INDEPENDENT_AMBULATORY_CARE_PROVIDER_SITE_OTHER): Payer: BC Managed Care – PPO | Admitting: Cardiovascular Disease

## 2012-03-27 ENCOUNTER — Encounter: Payer: Self-pay | Admitting: Cardiovascular Disease

## 2012-03-27 ENCOUNTER — Ambulatory Visit (INDEPENDENT_AMBULATORY_CARE_PROVIDER_SITE_OTHER): Payer: BC Managed Care – PPO | Admitting: *Deleted

## 2012-03-27 VITALS — BP 128/76 | HR 78 | Ht 71.0 in | Wt 230.8 lb

## 2012-03-27 DIAGNOSIS — I5022 Chronic systolic (congestive) heart failure: Secondary | ICD-10-CM

## 2012-03-27 DIAGNOSIS — I4891 Unspecified atrial fibrillation: Secondary | ICD-10-CM

## 2012-03-27 DIAGNOSIS — I2510 Atherosclerotic heart disease of native coronary artery without angina pectoris: Secondary | ICD-10-CM

## 2012-03-27 LAB — BASIC METABOLIC PANEL
Chloride: 101 mEq/L (ref 96–112)
Potassium: 3.7 mEq/L (ref 3.5–5.1)

## 2012-03-27 LAB — LIPID PANEL
Cholesterol: 162 mg/dL (ref 0–200)
HDL: 55.5 mg/dL (ref >39.00)
LDL Cholesterol: 78 mg/dL (ref 0–99)
Triglycerides: 141 mg/dL (ref 0.0–149.0)
VLDL: 28.2 mg/dL (ref 0.0–40.0)

## 2012-03-27 LAB — HEPATIC FUNCTION PANEL
Albumin: 4.2 g/dL (ref 3.5–5.2)
Total Protein: 7.6 g/dL (ref 6.0–8.3)

## 2012-03-27 LAB — POCT INR: INR: 2.5

## 2012-03-27 LAB — TSH: TSH: 2.35 u[IU]/mL (ref 0.35–5.50)

## 2012-03-27 NOTE — Assessment & Plan Note (Signed)
[name] is not having any episodes of chest pain or shortness breath. She's been able to do all of her normal activities. We'll continue with her same medications. 

## 2012-03-27 NOTE — Assessment & Plan Note (Signed)
[name] is not having any episodes of chest pain or shortness breath. She's been able to do all of her normal activities. We'll continue with her same medications.

## 2012-03-27 NOTE — Assessment & Plan Note (Signed)
[name] has a history of AICD discharge. Is not clear whether this was due to ventricular tachycardia or atrial fibrillation.  Will lower her amiodarone dose to 200 mg 3 times a week.  I've asked her to ask Dr. Ladona Ridgel about this dosage adjustment. I would  like to try to avoid any amiodarone toxicity.  Check a TSH, basic metabolic profile, and lipids today. Posterior again in 6 months for repeat lab work.

## 2012-03-27 NOTE — Patient Instructions (Signed)
Your physician wants you to follow-up in: 6 months with Dr. Elease Hashimoto.  You will receive a reminder letter in the mail two months in advance. If you don't receive a letter, please call our office to schedule the follow-up appointment.  Your physician recommends that you return for fasting lab work in: 6 months. BMP, LIPIDS, LIVER.  Decrease Amiodarone to 200mg  on Monday, Wednesday and Friday only.  Labs today:  TSH, BMP, Lipids, and HFP.

## 2012-03-27 NOTE — Progress Notes (Signed)
[name] Date of Birth  [birth year] [facility] [address]    [address] [place], [place]  [address] [phone]  Fax  [phone]  [facility] [address] [place], [place]  [address] [phone]   Fax [phone]  Problem list: 1. Coronary artery disease-status post anterior wall myocardial infarction. She status post stenting of her LAD in her 49, 63 in. We placed a 3.0 x 18 mm vision stent. Was post dilated with a 3.25 mm Rodriguez Hevia Voyager 2. Congestive heart failure with EF of 30-35% each bedtime an apical aneurysm. She's on Coumadin. 3. Status post AICD placement 4. Transient atrial ablation 5. Dyslipidemia   History of Present Illness:  [name] is a 61 year old female with a history of coronary artery disease. She status post anterior wall myocardial infarction. She has congestive heart failure with an ejection fraction of around 35%.  She's done very well since I last saw her. He is exercising on a fairly regular basis. She's not had any episodes of chest or shortness breath. She's not had any further AICD discharges. She's on amiodarone 200 mg a day. We will have her ask Dr. Ladona Ridgel about long-term use of amiodarone.  Current Outpatient Prescriptions on File Prior to Visit  Medication Sig Dispense Refill  . amiodarone (PACERONE) 200 MG tablet Take 200 mg by mouth daily. Take one (200 mg) tablet daily.      Marland Kitchen aspirin 81 MG tablet Take 81 mg by mouth daily.        . carvedilol (COREG) 25 MG tablet Take 25 mg by mouth 2 (two) times daily with a meal.        . furosemide (LASIX) 40 MG tablet TAKE ONE TABLET BY MOUTH EVERY DAY  30 tablet  5  . lisinopril (PRINIVIL,ZESTRIL) 10 MG tablet Take 10 mg by mouth daily.        Marland Kitchen PLAVIX 75 MG tablet TAKE ONE TABLET BY MOUTH EVERY DAY  30 each  7  . potassium chloride (K-DUR) 10 MEQ tablet TAKE ONE TABLET BY MOUTH EVERY DAY  30 tablet  6  . simvastatin (ZOCOR) 40 MG tablet TAKE ONE TABLET BY MOUTH IN THE  EVENING  30 tablet  7  . warfarin (COUMADIN) 5 MG tablet TAKE ONE TABLET BY MOUTH AS DIRECTED  30 tablet  3    No Known Allergies  Past Medical History  Diagnosis Date  . Coronary artery disease 12/12/2008    STATUS POST ANTERIOR WALL MYOCARDIAL INFARCTION. STATUS POST PTCA AND STENTING OF HER LAD  . CHF (congestive heart failure)     EF 30-35% BY ECHO. SHE HAS AN APICAL ANEURYSM  . Transient atrial fibrillation or flutter   . Dyslipidemia     Past Surgical History  Procedure Date  . Cardiac defibrillator placement     History  Smoking status  . Former Smoker  . Quit date: 12/14/2008  Smokeless tobacco  . Not on file    History  Alcohol Use No    No family history on file.  Reviw of Systems:  Reviewed in the HPI.  All other systems are negative.  Physical Exam: BP 128/76  Pulse 78  Ht 5\' 11"  (1.803 m)  Wt 230 lb 12.8 oz (104.69 kg)  BMI 32.19 kg/m2 The patient is alert and oriented x 3.  The mood and affect are normal.   Skin: warm and dry.  Color is normal.    HEENT:   Normocephalic/atraumatic. Her  neck is supple. There is no JVD.  Lungs: Lungs are clear to auscultation.   Heart: Regular rate S1-S2.    Abdomen: Shows good bowel sounds. No tenderness  Extremities:  No clubbing cyanosis or edema.  Neuro:  There exam is nonfocal.    ECG:  Assessment / Plan:

## 2012-03-27 NOTE — Assessment & Plan Note (Signed)
Stable

## 2012-05-08 ENCOUNTER — Ambulatory Visit (INDEPENDENT_AMBULATORY_CARE_PROVIDER_SITE_OTHER): Payer: BC Managed Care – PPO | Admitting: Pharmacist

## 2012-05-08 DIAGNOSIS — I4891 Unspecified atrial fibrillation: Secondary | ICD-10-CM

## 2012-06-04 ENCOUNTER — Other Ambulatory Visit: Payer: Self-pay | Admitting: Cardiovascular Disease

## 2012-06-05 NOTE — Telephone Encounter (Signed)
..   Requested Prescriptions   Pending Prescriptions Disp Refills  . KLOR-CON M10 10 MEQ tablet [Pharmacy Med Name: KLOR-CON M10        TAB] 30 each 6    Sig: TAKE ONE TABLET BY MOUTH EVERY DAY

## 2012-06-19 ENCOUNTER — Ambulatory Visit (INDEPENDENT_AMBULATORY_CARE_PROVIDER_SITE_OTHER): Payer: BC Managed Care – PPO | Admitting: *Deleted

## 2012-06-19 ENCOUNTER — Other Ambulatory Visit: Payer: Self-pay | Admitting: Cardiovascular Disease

## 2012-06-19 DIAGNOSIS — I4891 Unspecified atrial fibrillation: Secondary | ICD-10-CM

## 2012-06-19 LAB — POCT INR: INR: 2.4

## 2012-06-20 NOTE — Telephone Encounter (Signed)
Refill f/u-  Warfarin  Please return call to patient with the status of refill, she went to verified preferred and did not recv RX.  She can be reached at [phone]

## 2012-06-21 ENCOUNTER — Other Ambulatory Visit: Payer: Self-pay

## 2012-06-21 MED ORDER — WARFARIN SODIUM 5 MG PO TABS: ORAL_TABLET | ORAL | Status: DC

## 2012-07-21 ENCOUNTER — Other Ambulatory Visit: Payer: Self-pay | Admitting: Cardiovascular Disease

## 2012-07-22 NOTE — Telephone Encounter (Signed)
Fax Received. Refill Completed. [name] (R.M.A)   

## 2012-07-31 ENCOUNTER — Ambulatory Visit (INDEPENDENT_AMBULATORY_CARE_PROVIDER_SITE_OTHER): Payer: BC Managed Care – PPO | Admitting: *Deleted

## 2012-07-31 ENCOUNTER — Encounter: Payer: Self-pay | Admitting: *Deleted

## 2012-07-31 DIAGNOSIS — I4891 Unspecified atrial fibrillation: Secondary | ICD-10-CM

## 2012-07-31 LAB — POCT INR: INR: 1.9

## 2012-08-14 ENCOUNTER — Ambulatory Visit (INDEPENDENT_AMBULATORY_CARE_PROVIDER_SITE_OTHER): Payer: BC Managed Care – PPO | Admitting: Internal Medicine

## 2012-08-14 ENCOUNTER — Encounter: Payer: Self-pay | Admitting: Internal Medicine

## 2012-08-14 VITALS — BP 140/80 | HR 72 | Resp 18 | Ht 70.0 in | Wt 239.0 lb

## 2012-08-14 DIAGNOSIS — IMO0002 Reserved for concepts with insufficient information to code with codable children: Secondary | ICD-10-CM

## 2012-08-14 DIAGNOSIS — I5022 Chronic systolic (congestive) heart failure: Secondary | ICD-10-CM

## 2012-08-14 DIAGNOSIS — I4891 Unspecified atrial fibrillation: Secondary | ICD-10-CM

## 2012-08-14 DIAGNOSIS — Z95810 Presence of automatic (implantable) cardiac defibrillator: Secondary | ICD-10-CM

## 2012-08-14 LAB — ICD DEVICE OBSERVATION
BATTERY VOLTAGE: 3.147 V
BRDY-0002RV: 40 {beats}/min
FVT: 0
PACEART VT: 0
TZAT-0001SLOWVT: 1
TZAT-0002FASTVT: NEGATIVE
TZAT-0011SLOWVT: 10 ms
TZAT-0012FASTVT: 200 ms
TZAT-0018SLOWVT: NEGATIVE
TZAT-0019FASTVT: 8 V
TZAT-0019SLOWVT: 8 V
TZAT-0020FASTVT: 1.5 ms
TZAT-0020SLOWVT: 1.5 ms
TZON-0004VSLOWVT: 32
TZST-0001FASTVT: 5
TZST-0001SLOWVT: 2
TZST-0001SLOWVT: 3
TZST-0001SLOWVT: 4
TZST-0001SLOWVT: 6
TZST-0002FASTVT: NEGATIVE
TZST-0002FASTVT: NEGATIVE
TZST-0002FASTVT: NEGATIVE
TZST-0003SLOWVT: 20 J
TZST-0003SLOWVT: 35 J
TZST-0003SLOWVT: 35 J
VENTRICULAR PACING ICD: 0 pct
VF: 0

## 2012-08-14 NOTE — Assessment & Plan Note (Signed)
Her chronic systolic heart failure is well compensated and class II. She will continue her current medical therapy, maintain a low-sodium diet, and reduce her caloric intake.

## 2012-08-14 NOTE — Assessment & Plan Note (Signed)
She has had no recurrent arrhythmias or ICD shocks. I recommended stopping amiodarone. She will undergo watchful waiting.

## 2012-08-14 NOTE — Patient Instructions (Signed)
Your physician wants you to follow-up in: 12 months with Dr. Taylor. You will receive a reminder letter in the mail two months in advance. If you don't receive a letter, please call our office to schedule the follow-up appointment.    

## 2012-08-14 NOTE — Assessment & Plan Note (Signed)
Her Medtronic single chamber ICD is working normally. We reprogrammed her detections today to minimize unnecessary ICD shocks. We'll plan to recheck in several months.

## 2012-08-14 NOTE — Progress Notes (Signed)
HPI [name] returns today for followup. She is a very pleasant 61 year old woman with an ischemic cardiomyopathy and chronic class II congestive heart failure. She is status post ICD implantation. I saw her last a year ago. In the interim, she was visiting her family in [place] when she experienced an ICD shock. This was caused by atrial fibrillation with a rapid ventricular response. Her device was reprogrammed. She was placed on amiodarone. She is otherwise been stable. She denies palpitations or recurrent shocks. No chest pain, shortness of breath, or syncope. She admits to dietary indiscretion. No Known Allergies   Current Outpatient Prescriptions  Medication Sig Dispense Refill  . amiodarone (PACERONE) 200 MG tablet Take 200 mg by mouth daily. Take one (200 mg) tablet daily.      Marland Kitchen aspirin 81 MG tablet Take 81 mg by mouth daily.        . carvedilol (COREG) 25 MG tablet Take 25 mg by mouth 2 (two) times daily with a meal.        . furosemide (LASIX) 40 MG tablet TAKE ONE TABLET BY MOUTH EVERY DAY  30 tablet  5  . KLOR-CON M10 10 MEQ tablet TAKE ONE TABLET BY MOUTH EVERY DAY  30 each  6  . lisinopril (PRINIVIL,ZESTRIL) 10 MG tablet Take 10 mg by mouth daily.        Marland Kitchen PLAVIX 75 MG tablet TAKE ONE TABLET BY MOUTH EVERY DAY  30 each  7  . simvastatin (ZOCOR) 40 MG tablet TAKE ONE TABLET BY MOUTH IN THE EVENING  30 tablet  7  . warfarin (COUMADIN) 5 MG tablet Take as directed by coumadin clinic  30 tablet  3     Past Medical History  Diagnosis Date  . Coronary artery disease 12/12/2008    STATUS POST ANTERIOR WALL MYOCARDIAL INFARCTION. STATUS POST PTCA AND STENTING OF HER LAD  . CHF (congestive heart failure)     EF 30-35% BY ECHO. SHE HAS AN APICAL ANEURYSM  . Transient atrial fibrillation or flutter   . Dyslipidemia     ROS:   All systems reviewed and negative except as noted in the HPI.   Past Surgical History  Procedure Date  . Cardiac defibrillator  placement      No family history on file.   History   Social History  . Marital Status: Married    Spouse Name: N/A    Number of Children: N/A  . Years of Education: N/A   Occupational History  . Not on file.   Social History Main Topics  . Smoking status: Former Smoker    Quit date: 12/14/2008  . Smokeless tobacco: Not on file  . Alcohol Use: No  . Drug Use: No  . Sexually Active:    Other Topics Concern  . Not on file   Social History Narrative  . No narrative on file     BP 140/80  Pulse 72  Resp 18  Ht 5\' 10"  (1.778 m)  Wt 239 lb (108.41 kg)  BMI 34.29 kg/m2  SpO2 93%  Physical Exam:  Well appearing middle-aged woman, NAD HEENT: Unremarkable Neck:  No JVD, no thyromegally Lungs:  Clear with no wheezes, rales, or rhonchi. HEART:  Regular rate rhythm, no murmurs, no rubs, no clicks Abd:  soft, positive bowel sounds, no organomegally, no rebound, no guarding Ext:  2 plus pulses, no edema, no cyanosis, no clubbing Skin:  No rashes no nodules Neuro:  CN II through  XII intact, motor grossly intact  EKG Normal sinus rhythm with prior anterior myocardial infarction DEVICE  Normal device function.  See PaceArt for details.   Assess/Plan:

## 2012-08-28 ENCOUNTER — Encounter: Payer: Self-pay | Admitting: Internal Medicine

## 2012-09-03 ENCOUNTER — Other Ambulatory Visit: Payer: Self-pay | Admitting: *Deleted

## 2012-09-03 MED ORDER — CARVEDILOL 25 MG PO TABS: 25.0000 mg | ORAL_TABLET | Freq: Two times a day (BID) | ORAL | Status: DC

## 2012-09-03 MED ORDER — LISINOPRIL 10 MG PO TABS: 10.0000 mg | ORAL_TABLET | Freq: Every day | ORAL | Status: DC

## 2012-09-04 ENCOUNTER — Ambulatory Visit (INDEPENDENT_AMBULATORY_CARE_PROVIDER_SITE_OTHER): Payer: BC Managed Care – PPO | Admitting: *Deleted

## 2012-09-04 DIAGNOSIS — I4891 Unspecified atrial fibrillation: Secondary | ICD-10-CM

## 2012-09-05 NOTE — Addendum Note (Signed)
Addended by: Marrion Coy L on: 09/05/2012 12:04 PM   Modules accepted: Orders

## 2012-09-24 ENCOUNTER — Other Ambulatory Visit: Payer: Self-pay | Admitting: Cardiovascular Disease

## 2012-09-24 ENCOUNTER — Other Ambulatory Visit: Payer: Self-pay | Admitting: *Deleted

## 2012-09-24 MED ORDER — SIMVASTATIN 40 MG PO TABS: 40.0000 mg | ORAL_TABLET | Freq: Every day | ORAL | Status: DC

## 2012-09-24 NOTE — Telephone Encounter (Signed)
Refill- Simvistatin  Pt request medication for the second time.  Please reached out to verified preferred [facility] to research medication hold up.  Pt is waiting.

## 2012-09-24 NOTE — Telephone Encounter (Signed)
Fax Received. Refill Completed. Khairi Garman Chowoe (R.M.A)   

## 2012-09-24 NOTE — Telephone Encounter (Signed)
Opened in Error.

## 2012-10-09 ENCOUNTER — Ambulatory Visit: Payer: Self-pay | Admitting: Internal Medicine

## 2012-10-16 ENCOUNTER — Ambulatory Visit (INDEPENDENT_AMBULATORY_CARE_PROVIDER_SITE_OTHER): Payer: BC Managed Care – PPO | Admitting: Cardiovascular Disease

## 2012-10-16 ENCOUNTER — Encounter: Payer: Self-pay | Admitting: Cardiovascular Disease

## 2012-10-16 ENCOUNTER — Ambulatory Visit (INDEPENDENT_AMBULATORY_CARE_PROVIDER_SITE_OTHER): Payer: BC Managed Care – PPO | Admitting: *Deleted

## 2012-10-16 VITALS — BP 138/80 | HR 75 | Ht 70.0 in | Wt 236.4 lb

## 2012-10-16 DIAGNOSIS — I509 Heart failure, unspecified: Secondary | ICD-10-CM

## 2012-10-16 DIAGNOSIS — I2510 Atherosclerotic heart disease of native coronary artery without angina pectoris: Secondary | ICD-10-CM

## 2012-10-16 DIAGNOSIS — I4891 Unspecified atrial fibrillation: Secondary | ICD-10-CM

## 2012-10-16 LAB — POCT INR: INR: 1.9

## 2012-10-16 NOTE — Assessment & Plan Note (Signed)
[name]  is doing well. She needs to work on a good diet and exercise program. She readily admits this.  She's not having any signs or symptoms of congestive heart failure. He's on adequate medication.  We'll see her again in 6 months. I'll get an echocardiogram the week before her office visit.

## 2012-10-16 NOTE — Progress Notes (Signed)
[name] Date of Birth  July 30, 1951 [facility] [address]    [address] [place], [place]  [address] [phone]  Fax  [phone]  [facility] [address] [place], [place]  [address] [phone]   Fax [phone]  Problem list: 1. Coronary artery disease-status post anterior wall myocardial infarction. She status post stenting of her LAD in her Jan, 2010. We placed a 3.0 x 18 mm vision stent. Was post dilated with a 3.25 mm [place] Voyager 2. Congestive heart failure with EF of 30-35% each bedtime an apical aneurysm. She's on Coumadin. 3. Status post AICD placement   4. Transient atrial fibrillation 5. Dyslipidemia  History of Present Illness:  [name] is a 61 year old female with a history of coronary artery disease. She status post anterior wall myocardial infarction. She has congestive heart failure with an ejection fraction of around 35%.  She's done very well since I last saw her. He is exercising on a fairly regular basis. She's not had any episodes of chest or shortness breath.  She has done well.  - walks every day.  No chest pain or dyspnea.   She has not been as careful with her diet recently. She admits that she needs to work on a good low-fat, low carbohydrate diet.  Current Outpatient Prescriptions on File Prior to Visit  Medication Sig Dispense Refill  . aspirin 81 MG tablet Take 81 mg by mouth daily.        . carvedilol (COREG) 25 MG tablet Take 1 tablet (25 mg total) by mouth 2 (two) times daily with a meal.  60 tablet  6  . furosemide (LASIX) 40 MG tablet TAKE ONE TABLET BY MOUTH EVERY DAY  30 tablet  5  . KLOR-CON M10 10 MEQ tablet TAKE ONE TABLET BY MOUTH EVERY DAY  30 each  6  . lisinopril (PRINIVIL,ZESTRIL) 10 MG tablet Take 1 tablet (10 mg total) by mouth daily.  30 tablet  6  . PLAVIX 75 MG tablet TAKE ONE TABLET BY MOUTH EVERY DAY  30 each  7  . simvastatin (ZOCOR) 40 MG tablet Take 1 tablet (40 mg total) by mouth at bedtime.   30 tablet  5  . warfarin (COUMADIN) 5 MG tablet Take as directed by coumadin clinic  30 tablet  3    No Known Allergies  Past Medical History  Diagnosis Date  . Coronary artery disease 12/12/2008    STATUS POST ANTERIOR WALL MYOCARDIAL INFARCTION. STATUS POST PTCA AND STENTING OF HER LAD  . CHF (congestive heart failure)     EF 30-35% BY ECHO. SHE HAS AN APICAL ANEURYSM  . Transient atrial fibrillation or flutter   . Dyslipidemia     Past Surgical History  Procedure Date  . Cardiac defibrillator placement     History  Smoking status  . Former Smoker  . Quit date: 12/14/2008  Smokeless tobacco  . Not on file    History  Alcohol Use No    No family history on file.  Reviw of Systems:  Reviewed in the HPI.  All other systems are negative.  Physical Exam: BP 138/80  Pulse 75  Ht 5\' 10"  (1.778 m)  Wt 236 lb 6.4 oz (107.23 kg)  BMI 33.92 kg/m2  SpO2 98% The patient is alert and oriented x 3.  The mood and affect are normal.   Skin: warm and dry.  Color is normal.    HEENT:   Normocephalic/atraumatic. Her  neck is supple. There is no JVD.  Lungs: Lungs are clear to auscultation.   Heart: Regular rate S1-S2.    Abdomen: Shows good bowel sounds. No tenderness  Extremities:  No clubbing cyanosis or edema.  Neuro:  There exam is nonfocal.    ECG:  Assessment / Plan:

## 2012-10-16 NOTE — Assessment & Plan Note (Signed)
She has not had any recurrent episodes of atrial fibrillation.

## 2012-10-16 NOTE — Patient Instructions (Addendum)
Your physician wants you to follow-up in: 6 months  You will receive a reminder letter in the mail two months in advance. If you don't receive a letter, please call our office to schedule the follow-up appointment.  Your physician recommends that you return for a FASTING lipid profile: on next inr appointment  Your physician has requested that you have an echocardiogram 1 week prior to next appointment. Echocardiography is a painless test that uses sound waves to create images of your heart. It provides your doctor with information about the size and shape of your heart and how well your heart's chambers and valves are working. This procedure takes approximately one hour. There are no restrictions for this procedure.

## 2012-10-16 NOTE — Assessment & Plan Note (Addendum)
Brynley is doing well. She's not had any episodes of chest pain or shortness breath.  We'll have her return in several weeks for fasting lipid profile, hepatic profile, and basic metabolic profile.  I see her in 6 months. We'll plan on getting an echocardiogram  to evaluate her left ventricular systolic function the week prior to her office visit.

## 2012-10-17 ENCOUNTER — Ambulatory Visit: Payer: Self-pay | Admitting: Internal Medicine

## 2012-11-01 ENCOUNTER — Other Ambulatory Visit: Payer: Self-pay | Admitting: *Deleted

## 2012-11-01 ENCOUNTER — Other Ambulatory Visit: Payer: Self-pay | Admitting: Cardiovascular Disease

## 2012-11-01 MED ORDER — CLOPIDOGREL BISULFATE 75 MG PO TABS: 75.0000 mg | ORAL_TABLET | Freq: Every day | ORAL | Status: DC

## 2012-11-04 ENCOUNTER — Ambulatory Visit (INDEPENDENT_AMBULATORY_CARE_PROVIDER_SITE_OTHER): Payer: BC Managed Care – PPO | Admitting: *Deleted

## 2012-11-04 ENCOUNTER — Other Ambulatory Visit (INDEPENDENT_AMBULATORY_CARE_PROVIDER_SITE_OTHER): Payer: BC Managed Care – PPO

## 2012-11-04 DIAGNOSIS — I5022 Chronic systolic (congestive) heart failure: Secondary | ICD-10-CM

## 2012-11-04 DIAGNOSIS — I2510 Atherosclerotic heart disease of native coronary artery without angina pectoris: Secondary | ICD-10-CM

## 2012-11-04 DIAGNOSIS — I4891 Unspecified atrial fibrillation: Secondary | ICD-10-CM

## 2012-11-04 LAB — LIPID PANEL
Cholesterol: 161 mg/dL (ref 0–200)
LDL Cholesterol: 83 mg/dL (ref 0–99)
Triglycerides: 135 mg/dL (ref 0.0–149.0)

## 2012-11-04 LAB — HEPATIC FUNCTION PANEL
ALT: 23 U/L (ref 0–35)
Albumin: 4.3 g/dL (ref 3.5–5.2)
Total Bilirubin: 0.8 mg/dL (ref 0.3–1.2)

## 2012-11-04 LAB — BASIC METABOLIC PANEL
BUN: 11 mg/dL (ref 6–23)
Creatinine, Ser: 0.8 mg/dL (ref 0.4–1.2)
GFR: 95.18 mL/min (ref >60.00)

## 2012-12-04 ENCOUNTER — Ambulatory Visit (INDEPENDENT_AMBULATORY_CARE_PROVIDER_SITE_OTHER): Payer: BC Managed Care – PPO | Admitting: *Deleted

## 2012-12-04 DIAGNOSIS — I4891 Unspecified atrial fibrillation: Secondary | ICD-10-CM

## 2013-01-13 ENCOUNTER — Other Ambulatory Visit: Payer: Self-pay | Admitting: *Deleted

## 2013-01-13 MED ORDER — FUROSEMIDE 40 MG PO TABS: 40.0000 mg | ORAL_TABLET | Freq: Every day | ORAL | Status: DC

## 2013-01-13 NOTE — Telephone Encounter (Signed)
Fax Received. Refill Completed. Octavia Chowoe (R.M.A)   

## 2013-01-15 ENCOUNTER — Ambulatory Visit (INDEPENDENT_AMBULATORY_CARE_PROVIDER_SITE_OTHER): Payer: BC Managed Care – PPO | Admitting: Pharmacist

## 2013-01-15 DIAGNOSIS — I4891 Unspecified atrial fibrillation: Secondary | ICD-10-CM

## 2013-02-04 ENCOUNTER — Telehealth: Payer: Self-pay | Admitting: Cardiovascular Disease

## 2013-02-04 NOTE — Telephone Encounter (Signed)
Refill Request    Klor-con 10 mg

## 2013-02-04 NOTE — Telephone Encounter (Signed)
Refill Request  ° ° °Klor-con 10 mg ° °

## 2013-02-05 ENCOUNTER — Telehealth: Payer: Self-pay | Admitting: Cardiovascular Disease

## 2013-02-05 MED ORDER — POTASSIUM CHLORIDE CRYS ER 10 MEQ PO TBCR: 10.0000 meq | EXTENDED_RELEASE_TABLET | Freq: Every day | ORAL | Status: DC

## 2013-02-05 NOTE — Telephone Encounter (Signed)
New problem   Pt is complaining because she called the refill line several time to get her prescription  For Clorcon 10mg /[facility] [address]. Pt would like to speak to a nurse concerning this problem.

## 2013-02-05 NOTE — Telephone Encounter (Signed)
Tried to reach her x2, number rings and rings. Will attempt later.

## 2013-02-05 NOTE — Telephone Encounter (Signed)
Called pt to informed her that her med is ready for her to pick up. Pt states she already picked up her medication from pharmacy.

## 2013-02-05 NOTE — Telephone Encounter (Signed)
Lajoyce Corners will attempt to reach

## 2013-02-12 ENCOUNTER — Other Ambulatory Visit: Payer: Self-pay | Admitting: *Deleted

## 2013-02-12 MED ORDER — SIMVASTATIN 40 MG PO TABS: 40.0000 mg | ORAL_TABLET | Freq: Every day | ORAL | Status: DC

## 2013-02-12 MED ORDER — LISINOPRIL 10 MG PO TABS: 10.0000 mg | ORAL_TABLET | Freq: Every day | ORAL | Status: DC

## 2013-02-12 MED ORDER — CARVEDILOL 25 MG PO TABS: 25.0000 mg | ORAL_TABLET | Freq: Two times a day (BID) | ORAL | Status: DC

## 2013-02-12 NOTE — Telephone Encounter (Signed)
Fax Received. Refill Completed. [name] (R.M.A)   

## 2013-02-20 ENCOUNTER — Other Ambulatory Visit: Payer: Self-pay | Admitting: Internal Medicine

## 2013-02-20 ENCOUNTER — Ambulatory Visit (INDEPENDENT_AMBULATORY_CARE_PROVIDER_SITE_OTHER): Payer: BC Managed Care – PPO | Admitting: *Deleted

## 2013-02-20 DIAGNOSIS — I509 Heart failure, unspecified: Secondary | ICD-10-CM

## 2013-02-20 DIAGNOSIS — I2589 Other forms of chronic ischemic heart disease: Secondary | ICD-10-CM

## 2013-02-20 DIAGNOSIS — I4891 Unspecified atrial fibrillation: Secondary | ICD-10-CM

## 2013-02-20 LAB — ICD DEVICE OBSERVATION
BATTERY VOLTAGE: 3.127 V
BRDY-0002RV: 40 {beats}/min
CHARGE TIME: 8.968 s
FVT: 0
PACEART VT: 0
RV LEAD AMPLITUDE: 13 mv
TOT-0001: 3
TZAT-0004SLOWVT: 8
TZAT-0011SLOWVT: 10 ms
TZAT-0012FASTVT: 200 ms
TZAT-0012SLOWVT: 200 ms
TZAT-0018FASTVT: NEGATIVE
TZAT-0019FASTVT: 8 V
TZAT-0019SLOWVT: 8 V
TZAT-0020FASTVT: 1.5 ms
TZAT-0020SLOWVT: 1.5 ms
TZON-0003SLOWVT: 320 ms
TZON-0003VSLOWVT: 400 ms
TZON-0004VSLOWVT: 32
TZST-0001FASTVT: 2
TZST-0001FASTVT: 5
TZST-0001FASTVT: 6
TZST-0001SLOWVT: 4
TZST-0002FASTVT: NEGATIVE
TZST-0002FASTVT: NEGATIVE
TZST-0003SLOWVT: 20 J
TZST-0003SLOWVT: 35 J
TZST-0003SLOWVT: 35 J
VENTRICULAR PACING ICD: 0 pct
VF: 0

## 2013-02-20 NOTE — Progress Notes (Signed)
ICD check with ICM 

## 2013-03-06 ENCOUNTER — Other Ambulatory Visit: Payer: Self-pay | Admitting: Emergency Medicine

## 2013-03-06 MED ORDER — WARFARIN SODIUM 5 MG PO TABS: ORAL_TABLET | ORAL | Status: DC

## 2013-03-14 ENCOUNTER — Encounter: Payer: Self-pay | Admitting: Internal Medicine

## 2013-04-02 ENCOUNTER — Ambulatory Visit (INDEPENDENT_AMBULATORY_CARE_PROVIDER_SITE_OTHER): Payer: BC Managed Care – PPO | Admitting: *Deleted

## 2013-04-02 DIAGNOSIS — I4891 Unspecified atrial fibrillation: Secondary | ICD-10-CM

## 2013-04-02 LAB — POCT INR: INR: 2.2

## 2013-04-15 ENCOUNTER — Ambulatory Visit (HOSPITAL_COMMUNITY): Payer: BC Managed Care – PPO | Attending: Internal Medicine

## 2013-04-15 ENCOUNTER — Ambulatory Visit (INDEPENDENT_AMBULATORY_CARE_PROVIDER_SITE_OTHER): Payer: BC Managed Care – PPO | Admitting: Cardiovascular Disease

## 2013-04-15 ENCOUNTER — Encounter: Payer: Self-pay | Admitting: Cardiovascular Disease

## 2013-04-15 VITALS — BP 130/76 | HR 70 | Ht 70.0 in | Wt 231.8 lb

## 2013-04-15 DIAGNOSIS — I5031 Acute diastolic (congestive) heart failure: Secondary | ICD-10-CM | POA: Insufficient documentation

## 2013-04-15 DIAGNOSIS — I509 Heart failure, unspecified: Secondary | ICD-10-CM

## 2013-04-15 DIAGNOSIS — I5022 Chronic systolic (congestive) heart failure: Secondary | ICD-10-CM

## 2013-04-15 DIAGNOSIS — I4891 Unspecified atrial fibrillation: Secondary | ICD-10-CM

## 2013-04-15 DIAGNOSIS — E785 Hyperlipidemia, unspecified: Secondary | ICD-10-CM

## 2013-04-15 DIAGNOSIS — I2510 Atherosclerotic heart disease of native coronary artery without angina pectoris: Secondary | ICD-10-CM

## 2013-04-15 LAB — BASIC METABOLIC PANEL
CO2: 28 mEq/L (ref 19–32)
Calcium: 9.6 mg/dL (ref 8.4–10.5)
Chloride: 102 mEq/L (ref 96–112)
Glucose, Bld: 128 mg/dL — ABNORMAL HIGH (ref 70–99)
Sodium: 140 mEq/L (ref 135–145)

## 2013-04-15 LAB — HEPATIC FUNCTION PANEL
ALT: 23 U/L (ref 0–35)
AST: 24 U/L (ref 0–37)
Albumin: 4.2 g/dL (ref 3.5–5.2)
Alkaline Phosphatase: 80 U/L (ref 39–117)
Total Protein: 7.9 g/dL (ref 6.0–8.3)

## 2013-04-15 LAB — LIPID PANEL: HDL: 45.5 mg/dL (ref >39.00)

## 2013-04-15 NOTE — Assessment & Plan Note (Signed)
She has remained in NSR.   Will get an ecg at her next ov.

## 2013-04-15 NOTE — Progress Notes (Signed)
Echocardiogram performed.  

## 2013-04-15 NOTE — Assessment & Plan Note (Signed)
She is doing well. No angina.  

## 2013-04-15 NOTE — Patient Instructions (Addendum)
Your physician has requested that you have an echocardiogram. Echocardiography is a painless test that uses sound waves to create images of your heart. It provides your doctor with information about the size and shape of your heart and how well your heart's chambers and valves are working. This procedure takes approximately one hour. There are no restrictions for this procedure.  Your physician wants you to follow-up in: 6 MONTHS WITH EKG You will receive a reminder letter in the mail two months in advance. If you don't receive a letter, please call our office to schedule the follow-up appointment.  Your physician recommends that you return for a FASTING lipid profile: TODAY  Your physician recommends that you return for a FASTING lipid profile: 6 MONTH

## 2013-04-15 NOTE — Progress Notes (Signed)
[name] Date of Birth  1951/10/15 [facility] [address]    [address] [place], [place]  [address] [phone]  Fax  [phone]  [facility] [address] [place], [place]  [address] [phone]   Fax [phone]  Problem list: 1. Coronary artery disease-status post anterior wall myocardial infarction. She status post stenting of her LAD in her Jan, 2010. We placed a 3.0 x 18 mm vision stent. Was post dilated with a 3.25 mm [place] Voyager 2. Congestive heart failure with EF of 30-35% associated with  an apical aneurysm. She's on Coumadin. 3. Status post AICD placement   4. Transient atrial fibrillation 5. Dyslipidemia  History of Present Illness:  [name] is a 62 year old female with a history of coronary artery disease. She status post anterior wall myocardial infarction. She has congestive heart failure with an ejection fraction of around 35%.  She's done very well since I last saw her. He is exercising on a fairly regular basis. She's not had any episodes of chest or shortness breath.  She has done well.  - walks every day.  No chest pain or dyspnea.   She has not been as careful with her diet recently. She admits that she needs to work on a good low-fat, low carbohydrate diet.  April 15, 2013:  [name] is doing well.  No CP or dyspnea.  Still walks every day ( 3/4 of a mile).  She is due to have an echo.    Current Outpatient Prescriptions on File Prior to Visit  Medication Sig Dispense Refill  . aspirin 81 MG tablet Take 81 mg by mouth daily.        . carvedilol (COREG) 25 MG tablet Take 1 tablet (25 mg total) by mouth 2 (two) times daily with a meal.  60 tablet  6  . clopidogrel (PLAVIX) 75 MG tablet Take 1 tablet (75 mg total) by mouth daily.  30 tablet  6  . furosemide (LASIX) 40 MG tablet Take 1 tablet (40 mg total) by mouth daily.  30 tablet  5  . lisinopril (PRINIVIL,ZESTRIL) 10 MG tablet Take 1 tablet (10 mg total) by mouth daily.  30  tablet  6  . potassium chloride (KLOR-CON M10) 10 MEQ tablet Take 1 tablet (10 mEq total) by mouth daily.  30 tablet  5  . simvastatin (ZOCOR) 40 MG tablet Take 1 tablet (40 mg total) by mouth at bedtime.  30 tablet  5  . warfarin (COUMADIN) 5 MG tablet Take as directed by coumadin clinic  30 tablet  3   No current facility-administered medications on file prior to visit.    No Known Allergies  Past Medical History  Diagnosis Date  . Coronary artery disease 12/12/2008    STATUS POST ANTERIOR WALL MYOCARDIAL INFARCTION. STATUS POST PTCA AND STENTING OF HER LAD  . CHF (congestive heart failure)     EF 30-35% BY ECHO. SHE HAS AN APICAL ANEURYSM  . Transient atrial fibrillation or flutter   . Dyslipidemia     Past Surgical History  Procedure Laterality Date  . Cardiac defibrillator placement      History  Smoking status  . Former Smoker  . Quit date: 12/14/2008  Smokeless tobacco  . Not on file    History  Alcohol Use No    No family history on file.  Reviw of Systems:  Reviewed in the HPI.  All other systems are negative.  Physical Exam: BP 130/76  Pulse 70  Ht 5\' 10"  (1.778 m)  Wt 231 lb 12.8 oz (105.144 kg)  BMI 33.26 kg/m2  SpO2 99% The patient is alert and oriented x 3.  The mood and affect are normal.   Skin: warm and dry.  Color is normal.    HEENT:   Normocephalic/atraumatic. Her neck is supple. There is no JVD.  Lungs: Lungs are clear to auscultation.   Heart: Regular rate S1-S2.    Abdomen: Shows good bowel sounds. No tenderness  Extremities:  No clubbing cyanosis or edema.  Neuro:  There exam is nonfocal.    ECG:  Assessment / Plan:

## 2013-04-15 NOTE — Assessment & Plan Note (Addendum)
[name] is doing well.  No significant DOE.  She has been on standard CHF therapy.  Will repeat her echocardiogram.  I have encouraged her to continue walking.   She has an apical aneurism.  Will continue coumadin.

## 2013-05-14 ENCOUNTER — Ambulatory Visit (INDEPENDENT_AMBULATORY_CARE_PROVIDER_SITE_OTHER): Payer: BC Managed Care – PPO | Admitting: *Deleted

## 2013-05-14 DIAGNOSIS — I4891 Unspecified atrial fibrillation: Secondary | ICD-10-CM

## 2013-06-19 ENCOUNTER — Encounter: Payer: Self-pay | Admitting: Internal Medicine

## 2013-06-20 ENCOUNTER — Other Ambulatory Visit: Payer: Self-pay | Admitting: *Deleted

## 2013-06-20 ENCOUNTER — Telehealth: Payer: Self-pay | Admitting: Cardiovascular Disease

## 2013-06-20 DIAGNOSIS — I4891 Unspecified atrial fibrillation: Secondary | ICD-10-CM

## 2013-06-20 DIAGNOSIS — I2510 Atherosclerotic heart disease of native coronary artery without angina pectoris: Secondary | ICD-10-CM

## 2013-06-20 DIAGNOSIS — E785 Hyperlipidemia, unspecified: Secondary | ICD-10-CM

## 2013-06-20 NOTE — Progress Notes (Signed)
Lab orders placed.  

## 2013-06-20 NOTE — Telephone Encounter (Signed)
New Prob     Pt scheduled for labs and follow up. Orders needed in EPIC for labs.

## 2013-06-20 NOTE — Telephone Encounter (Signed)
Lab orders placed.  

## 2013-06-23 ENCOUNTER — Encounter: Payer: Self-pay | Admitting: Internal Medicine

## 2013-06-25 ENCOUNTER — Ambulatory Visit (INDEPENDENT_AMBULATORY_CARE_PROVIDER_SITE_OTHER): Payer: BC Managed Care – PPO | Admitting: *Deleted

## 2013-06-25 DIAGNOSIS — I4891 Unspecified atrial fibrillation: Secondary | ICD-10-CM

## 2013-07-08 ENCOUNTER — Other Ambulatory Visit: Payer: Self-pay | Admitting: *Deleted

## 2013-07-08 MED ORDER — FUROSEMIDE 40 MG PO TABS: 40.0000 mg | ORAL_TABLET | Freq: Every day | ORAL | Status: DC

## 2013-07-08 MED ORDER — WARFARIN SODIUM 5 MG PO TABS: ORAL_TABLET | ORAL | Status: DC

## 2013-08-06 ENCOUNTER — Other Ambulatory Visit: Payer: Self-pay | Admitting: *Deleted

## 2013-08-06 ENCOUNTER — Ambulatory Visit (INDEPENDENT_AMBULATORY_CARE_PROVIDER_SITE_OTHER): Payer: BC Managed Care – PPO | Admitting: Pharmacist

## 2013-08-06 DIAGNOSIS — I4891 Unspecified atrial fibrillation: Secondary | ICD-10-CM

## 2013-08-06 MED ORDER — POTASSIUM CHLORIDE CRYS ER 10 MEQ PO TBCR: 10.0000 meq | EXTENDED_RELEASE_TABLET | Freq: Every day | ORAL | Status: DC

## 2013-08-25 ENCOUNTER — Encounter: Payer: BC Managed Care – PPO | Admitting: Internal Medicine

## 2013-08-28 ENCOUNTER — Ambulatory Visit (INDEPENDENT_AMBULATORY_CARE_PROVIDER_SITE_OTHER): Payer: BC Managed Care – PPO | Admitting: *Deleted

## 2013-08-28 ENCOUNTER — Ambulatory Visit (INDEPENDENT_AMBULATORY_CARE_PROVIDER_SITE_OTHER): Payer: BC Managed Care – PPO | Admitting: Internal Medicine

## 2013-08-28 ENCOUNTER — Encounter: Payer: Self-pay | Admitting: Internal Medicine

## 2013-08-28 VITALS — BP 142/86 | HR 76 | Ht 69.0 in | Wt 234.8 lb

## 2013-08-28 DIAGNOSIS — I4891 Unspecified atrial fibrillation: Secondary | ICD-10-CM

## 2013-08-28 DIAGNOSIS — I2589 Other forms of chronic ischemic heart disease: Secondary | ICD-10-CM

## 2013-08-28 DIAGNOSIS — I5022 Chronic systolic (congestive) heart failure: Secondary | ICD-10-CM

## 2013-08-28 DIAGNOSIS — Z95810 Presence of automatic (implantable) cardiac defibrillator: Secondary | ICD-10-CM

## 2013-08-28 DIAGNOSIS — I509 Heart failure, unspecified: Secondary | ICD-10-CM

## 2013-08-28 LAB — ICD DEVICE OBSERVATION
BATTERY VOLTAGE: 3.1104 V
BRDY-0002RV: 40 {beats}/min
CHARGE TIME: 9.138 s
RV LEAD AMPLITUDE: 12.375 mv
TOT-0006: 20110627000000
TZAT-0005SLOWVT: 88 pct
TZAT-0011SLOWVT: 10 ms
TZAT-0012SLOWVT: 200 ms
TZAT-0013SLOWVT: 3
TZAT-0018SLOWVT: NEGATIVE
TZAT-0019SLOWVT: 8 V
TZAT-0020FASTVT: 1.5 ms
TZON-0003SLOWVT: 320 ms
TZON-0003VSLOWVT: 400 ms
TZON-0004SLOWVT: 24
TZON-0004VSLOWVT: 32
TZON-0005SLOWVT: 12
TZST-0001FASTVT: 2
TZST-0001FASTVT: 3
TZST-0001FASTVT: 5
TZST-0001SLOWVT: 3
TZST-0001SLOWVT: 5
TZST-0001SLOWVT: 6
TZST-0002FASTVT: NEGATIVE
TZST-0002FASTVT: NEGATIVE
TZST-0003SLOWVT: 35 J
VENTRICULAR PACING ICD: 0 pct

## 2013-08-28 NOTE — Patient Instructions (Signed)
Your physician wants you to follow-up in: 6 months with device clinic. You will receive a reminder letter in the mail two months in advance. If you don't receive a letter, please call our office to schedule the follow-up appointment.    Your physician wants you to follow-up in: one year with Dr. Ladona Ridgel.  You will receive a reminder letter in the mail two months in advance. If you don't receive a letter, please call our office to schedule the follow-up appointment.

## 2013-08-29 ENCOUNTER — Encounter: Payer: Self-pay | Admitting: Internal Medicine

## 2013-08-29 NOTE — Assessment & Plan Note (Signed)
Her Medtronic ICD is working normally. We'll plan to recheck in several months. 

## 2013-08-29 NOTE — Assessment & Plan Note (Signed)
Her chronic systolic heart failure is well compensated, class IIA. She'll continue her current medical therapy, maintain a low-sodium diet, and try to increase her physical activity.

## 2013-08-29 NOTE — Progress Notes (Signed)
      HPI [name] returns today for followup. She is a 62 year old woman with a history of chronic systolic heart failure, an ischemic cardiomyopathy, paroxysmal atrial fibrillation, status post ICD implantation. In the interim, she has done well. She denies chest pain, shortness of breath, or syncope. No Known Allergies   Current Outpatient Prescriptions  Medication Sig Dispense Refill  . aspirin 81 MG tablet Take 81 mg by mouth daily.        . carvedilol (COREG) 25 MG tablet Take 1 tablet (25 mg total) by mouth 2 (two) times daily with a meal.  60 tablet  6  . clopidogrel (PLAVIX) 75 MG tablet Take 1 tablet (75 mg total) by mouth daily.  30 tablet  6  . furosemide (LASIX) 40 MG tablet Take 1 tablet (40 mg total) by mouth daily.  30 tablet  5  . lisinopril (PRINIVIL,ZESTRIL) 10 MG tablet Take 1 tablet (10 mg total) by mouth daily.  30 tablet  6  . potassium chloride (KLOR-CON M10) 10 MEQ tablet Take 1 tablet (10 mEq total) by mouth daily.  30 tablet  3  . simvastatin (ZOCOR) 40 MG tablet Take 1 tablet (40 mg total) by mouth at bedtime.  30 tablet  5  . warfarin (COUMADIN) 5 MG tablet Take as directed by coumadin clinic  30 tablet  3   No current facility-administered medications for this visit.     Past Medical History  Diagnosis Date  . Coronary artery disease 12/12/2008    STATUS POST ANTERIOR WALL MYOCARDIAL INFARCTION. STATUS POST PTCA AND STENTING OF HER LAD  . CHF (congestive heart failure)     EF 30-35% BY ECHO. SHE HAS AN APICAL ANEURYSM  . Transient atrial fibrillation or flutter   . Dyslipidemia     ROS:   All systems reviewed and negative except as noted in the HPI.   Past Surgical History  Procedure Laterality Date  . Cardiac defibrillator placement       No family history on file.   History   Social History  . Marital Status: Married    Spouse Name: N/A    Number of Children: N/A  . Years of Education: N/A   Occupational History  . Not on  file.   Social History Main Topics  . Smoking status: Former Smoker    Quit date: 12/14/2008  . Smokeless tobacco: Not on file  . Alcohol Use: No  . Drug Use: No  . Sexual Activity:    Other Topics Concern  . Not on file   Social History Narrative  . No narrative on file     BP 142/86  Pulse 76  Ht 5\' 9"  (1.753 m)  Wt 234 lb 12.8 oz (106.505 kg)  BMI 34.66 kg/m2  Physical Exam:  Well appearing middle-age woman, NAD HEENT: Unremarkable Neck:  No JVD, no thyromegally Back:  No CVA tenderness Lungs:  Clear with no wheezes, rales, or rhonchi. HEART:  Regular rate rhythm, no murmurs, no rubs, no clicks Abd:  soft, obese, positive bowel sounds, no organomegally, no rebound, no guarding Ext:  2 plus pulses, no edema, no cyanosis, no clubbing Skin:  No rashes no nodules Neuro:  CN II through XII intact, motor grossly intact   DEVICE  Normal device function.  See PaceArt for details.   Assess/Plan:

## 2013-09-05 ENCOUNTER — Other Ambulatory Visit: Payer: Self-pay

## 2013-09-05 MED ORDER — LISINOPRIL 10 MG PO TABS: 10.0000 mg | ORAL_TABLET | Freq: Every day | ORAL | Status: DC

## 2013-09-09 ENCOUNTER — Other Ambulatory Visit: Payer: Self-pay | Admitting: Cardiovascular Disease

## 2013-10-06 ENCOUNTER — Ambulatory Visit (INDEPENDENT_AMBULATORY_CARE_PROVIDER_SITE_OTHER): Payer: BC Managed Care – PPO | Admitting: Pharmacist

## 2013-10-06 DIAGNOSIS — I4891 Unspecified atrial fibrillation: Secondary | ICD-10-CM

## 2013-10-07 ENCOUNTER — Other Ambulatory Visit: Payer: Self-pay

## 2013-10-07 MED ORDER — CARVEDILOL 25 MG PO TABS: 25.0000 mg | ORAL_TABLET | Freq: Two times a day (BID) | ORAL | Status: DC

## 2013-10-13 ENCOUNTER — Ambulatory Visit: Payer: Self-pay | Admitting: Internal Medicine

## 2013-10-14 ENCOUNTER — Ambulatory Visit (INDEPENDENT_AMBULATORY_CARE_PROVIDER_SITE_OTHER): Payer: BC Managed Care – PPO | Admitting: Cardiovascular Disease

## 2013-10-14 ENCOUNTER — Encounter (INDEPENDENT_AMBULATORY_CARE_PROVIDER_SITE_OTHER): Payer: Self-pay

## 2013-10-14 ENCOUNTER — Other Ambulatory Visit: Payer: BC Managed Care – PPO

## 2013-10-14 ENCOUNTER — Encounter: Payer: Self-pay | Admitting: Cardiovascular Disease

## 2013-10-14 VITALS — BP 132/80 | HR 80 | Ht 68.0 in | Wt 231.8 lb

## 2013-10-14 DIAGNOSIS — I4891 Unspecified atrial fibrillation: Secondary | ICD-10-CM

## 2013-10-14 DIAGNOSIS — E785 Hyperlipidemia, unspecified: Secondary | ICD-10-CM

## 2013-10-14 DIAGNOSIS — I5022 Chronic systolic (congestive) heart failure: Secondary | ICD-10-CM

## 2013-10-14 DIAGNOSIS — I2510 Atherosclerotic heart disease of native coronary artery without angina pectoris: Secondary | ICD-10-CM

## 2013-10-14 DIAGNOSIS — I509 Heart failure, unspecified: Secondary | ICD-10-CM

## 2013-10-14 LAB — BASIC METABOLIC PANEL
CO2: 29 mEq/L (ref 19–32)
Calcium: 9.4 mg/dL (ref 8.4–10.5)
Chloride: 103 mEq/L (ref 96–112)
Creatinine, Ser: 0.7 mg/dL (ref 0.4–1.2)
GFR: 109.1 mL/min (ref >60.00)
Glucose, Bld: 95 mg/dL (ref 70–99)
Potassium: 4 mEq/L (ref 3.5–5.1)

## 2013-10-14 LAB — LIPID PANEL
HDL: 47.1 mg/dL (ref >39.00)
LDL Cholesterol: 69 mg/dL (ref 0–99)
Total CHOL/HDL Ratio: 3
Triglycerides: 117 mg/dL (ref 0.0–149.0)
VLDL: 23.4 mg/dL (ref 0.0–40.0)

## 2013-10-14 LAB — HEPATIC FUNCTION PANEL
AST: 23 U/L (ref 0–37)
Albumin: 4.2 g/dL (ref 3.5–5.2)
Alkaline Phosphatase: 86 U/L (ref 39–117)
Total Bilirubin: 0.7 mg/dL (ref 0.3–1.2)

## 2013-10-14 MED ORDER — SPIRONOLACTONE 25 MG PO TABS: 25.0000 mg | ORAL_TABLET | Freq: Every day | ORAL | Status: DC

## 2013-10-14 NOTE — Assessment & Plan Note (Signed)
She's not having any further episodes of chest pain.

## 2013-10-14 NOTE — Progress Notes (Signed)
[name] Date of Birth  02-Jul-1951 [facility] [address]    [address] [place], [place]  [address] [phone]  Fax  [phone]  [facility] [address] [place], [place]  [address] [phone]   Fax [phone]  Problem list: 1. Coronary artery disease-status post anterior wall myocardial infarction. She status post stenting of her LAD in her Jan, 2010. We placed a 3.0 x 18 mm vision stent. Was post dilated with a 3.25 mm [place] Voyager 2. Congestive heart failure with EF of 30-35% associated with  an apical aneurysm. She's on Coumadin. 3. Status post AICD placement   4. Transient atrial fibrillation 5. Dyslipidemia  History of Present Illness:  [name] is a 62 year old female with a history of coronary artery disease. She status post anterior wall myocardial infarction. She has congestive heart failure with an ejection fraction of around 35%.  She's done very well since I last saw her. He is exercising on a fairly regular basis. She's not had any episodes of chest or shortness breath.  She has done well.  - walks every day.  No chest pain or dyspnea.   She has not been as careful with her diet recently. She admits that she needs to work on a good low-fat, low carbohydrate diet.  April 15, 2013:  [name] is doing well.  No CP or dyspnea.  Still walks every day ( 3/4 of a mile).  She is due to have an echo.   Dec. 2, 2014:  [name] is doing well.  No CP or dyspnea.    Still walking (3 laps around the mall  Or 30 minutes)  Every other day.  Echocardiogram in June, 2014 sutures a persistently reduced left ventricular systolic function with an EF of around 30%. She has mild pulmonary hypertension with estimated PA pressure of 40. Left ventricle: Akinesis septum. Severe hypokinesis of the apex. The cavity size was normal. Wall thickness was increased in a pattern of mild LVH. The estimated ejection fraction was 30%. Findings consistent with left ventricular  diastolic dysfunction. - Mitral valve: Mild regurgitation. - Left atrium: The atrium was mildly dilated. - Pulmonary arteries: PA peak pressure: 40mm Hg        Current Outpatient Prescriptions on File Prior to Visit  Medication Sig Dispense Refill  . aspirin 81 MG tablet Take 81 mg by mouth daily.        . carvedilol (COREG) 25 MG tablet Take 1 tablet (25 mg total) by mouth 2 (two) times daily with a meal.  60 tablet  6  . clopidogrel (PLAVIX) 75 MG tablet Take 1 tablet (75 mg total) by mouth daily.  30 tablet  6  . furosemide (LASIX) 40 MG tablet Take 1 tablet (40 mg total) by mouth daily.  30 tablet  5  . lisinopril (PRINIVIL,ZESTRIL) 10 MG tablet Take 1 tablet (10 mg total) by mouth daily.  30 tablet  6  . potassium chloride (KLOR-CON M10) 10 MEQ tablet Take 1 tablet (10 mEq total) by mouth daily.  30 tablet  3  . simvastatin (ZOCOR) 40 MG tablet TAKE ONE TABLET BY MOUTH AT BEDTIME  30 tablet  0  . warfarin (COUMADIN) 5 MG tablet Take as directed by coumadin clinic  30 tablet  3   No current facility-administered medications on file prior to visit.    No Known Allergies  Past Medical History  Diagnosis Date  . Coronary artery disease 12/12/2008  STATUS POST ANTERIOR WALL MYOCARDIAL INFARCTION. STATUS POST PTCA AND STENTING OF HER LAD  . CHF (congestive heart failure)     EF 30-35% BY ECHO. SHE HAS AN APICAL ANEURYSM  . Transient atrial fibrillation or flutter   . Dyslipidemia     Past Surgical History  Procedure Laterality Date  . Cardiac defibrillator placement      History  Smoking status  . Former Smoker  . Quit date: 12/14/2008  Smokeless tobacco  . Not on file    History  Alcohol Use No    No family history on file.  Reviw of Systems:  Reviewed in the HPI.  All other systems are negative.  Physical Exam: BP 132/80  Pulse 80  Ht 5\' 8"  (1.727 m)  Wt 231 lb 12.8 oz (105.144 kg)  BMI 35.25 kg/m2 The patient is alert and oriented x 3.  The mood and  affect are normal.   Skin: warm and dry.  Color is normal.    HEENT:   Normocephalic/atraumatic. Her neck is supple. There is no JVD.  Lungs: Lungs are clear to auscultation.   Heart: Regular rate S1-S2.    Abdomen: Shows good bowel sounds. No tenderness  Extremities:  No clubbing cyanosis or edema.  Neuro:  There exam is nonfocal.    ECG:  Assessment / Plan:

## 2013-10-14 NOTE — Patient Instructions (Signed)
Your physician has recommended you make the following change in your medication:  Start spironolactone 25 mg daily  Your physician recommends that you return for lab work in: 1 week and in 1 month  Your physician wants you to follow-up in:6 months  You will receive a reminder letter in the mail two months in advance. If you don't receive a letter, please call our office to schedule the follow-up appointment.

## 2013-10-14 NOTE — Assessment & Plan Note (Signed)
[name]  seems to be doing fairly well. We'll continue with her same medications.

## 2013-10-16 ENCOUNTER — Other Ambulatory Visit: Payer: Self-pay

## 2013-10-16 ENCOUNTER — Telehealth: Payer: Self-pay | Admitting: Cardiovascular Disease

## 2013-10-16 MED ORDER — SIMVASTATIN 40 MG PO TABS: ORAL_TABLET | ORAL | Status: DC

## 2013-10-16 NOTE — Telephone Encounter (Signed)
Refill done.  

## 2013-10-16 NOTE — Telephone Encounter (Signed)
New message    Refill simvastatin----[facility]/[place]

## 2013-10-21 ENCOUNTER — Other Ambulatory Visit (INDEPENDENT_AMBULATORY_CARE_PROVIDER_SITE_OTHER): Payer: BC Managed Care – PPO

## 2013-10-21 DIAGNOSIS — I4891 Unspecified atrial fibrillation: Secondary | ICD-10-CM

## 2013-10-21 DIAGNOSIS — E785 Hyperlipidemia, unspecified: Secondary | ICD-10-CM

## 2013-10-21 DIAGNOSIS — I2510 Atherosclerotic heart disease of native coronary artery without angina pectoris: Secondary | ICD-10-CM

## 2013-10-21 LAB — BASIC METABOLIC PANEL
CO2: 27 mEq/L (ref 19–32)
Calcium: 9.3 mg/dL (ref 8.4–10.5)
Chloride: 100 mEq/L (ref 96–112)
GFR: 103.94 mL/min (ref >60.00)
Glucose, Bld: 113 mg/dL — ABNORMAL HIGH (ref 70–99)
Sodium: 136 mEq/L (ref 135–145)

## 2013-11-19 ENCOUNTER — Ambulatory Visit (INDEPENDENT_AMBULATORY_CARE_PROVIDER_SITE_OTHER): Payer: BC Managed Care – PPO | Admitting: *Deleted

## 2013-11-19 ENCOUNTER — Other Ambulatory Visit (INDEPENDENT_AMBULATORY_CARE_PROVIDER_SITE_OTHER): Payer: BC Managed Care – PPO

## 2013-11-19 DIAGNOSIS — I4891 Unspecified atrial fibrillation: Secondary | ICD-10-CM

## 2013-11-19 DIAGNOSIS — I2510 Atherosclerotic heart disease of native coronary artery without angina pectoris: Secondary | ICD-10-CM

## 2013-11-19 DIAGNOSIS — E785 Hyperlipidemia, unspecified: Secondary | ICD-10-CM

## 2013-11-19 LAB — BASIC METABOLIC PANEL
BUN: 12 mg/dL (ref 6–23)
CALCIUM: 9.6 mg/dL (ref 8.4–10.5)
CHLORIDE: 99 meq/L (ref 96–112)
CO2: 29 meq/L (ref 19–32)
Creatinine, Ser: 0.8 mg/dL (ref 0.4–1.2)
GFR: 90.86 mL/min (ref >60.00)
Glucose, Bld: 101 mg/dL — ABNORMAL HIGH (ref 70–99)
POTASSIUM: 4 meq/L (ref 3.5–5.1)
SODIUM: 138 meq/L (ref 135–145)

## 2013-11-19 LAB — POCT INR: INR: 1.8

## 2013-11-27 ENCOUNTER — Other Ambulatory Visit: Payer: Self-pay

## 2013-11-27 MED ORDER — CLOPIDOGREL BISULFATE 75 MG PO TABS: 75.0000 mg | ORAL_TABLET | Freq: Every day | ORAL | Status: DC

## 2013-11-27 MED ORDER — FUROSEMIDE 40 MG PO TABS: 40.0000 mg | ORAL_TABLET | Freq: Every day | ORAL | Status: DC

## 2013-12-02 ENCOUNTER — Other Ambulatory Visit: Payer: Self-pay

## 2013-12-02 ENCOUNTER — Telehealth: Payer: Self-pay

## 2013-12-02 MED ORDER — POTASSIUM CHLORIDE CRYS ER 10 MEQ PO TBCR: 10.0000 meq | EXTENDED_RELEASE_TABLET | Freq: Every day | ORAL | Status: DC

## 2013-12-02 MED ORDER — WARFARIN SODIUM 5 MG PO TABS: ORAL_TABLET | ORAL | Status: DC

## 2013-12-02 NOTE — Telephone Encounter (Signed)
Called pt and informed that refill has been done

## 2013-12-10 ENCOUNTER — Ambulatory Visit (INDEPENDENT_AMBULATORY_CARE_PROVIDER_SITE_OTHER): Payer: BC Managed Care – PPO | Admitting: *Deleted

## 2013-12-10 DIAGNOSIS — I4891 Unspecified atrial fibrillation: Secondary | ICD-10-CM

## 2013-12-10 DIAGNOSIS — Z5181 Encounter for therapeutic drug level monitoring: Secondary | ICD-10-CM

## 2013-12-10 LAB — POCT INR: INR: 2.6

## 2014-01-06 ENCOUNTER — Encounter: Payer: Self-pay | Admitting: Cardiovascular Disease

## 2014-01-21 ENCOUNTER — Ambulatory Visit (INDEPENDENT_AMBULATORY_CARE_PROVIDER_SITE_OTHER): Payer: BC Managed Care – PPO | Admitting: *Deleted

## 2014-01-21 DIAGNOSIS — Z5181 Encounter for therapeutic drug level monitoring: Secondary | ICD-10-CM

## 2014-01-21 DIAGNOSIS — I4891 Unspecified atrial fibrillation: Secondary | ICD-10-CM

## 2014-01-21 LAB — POCT INR: INR: 2.9

## 2014-02-25 ENCOUNTER — Ambulatory Visit (INDEPENDENT_AMBULATORY_CARE_PROVIDER_SITE_OTHER): Payer: BC Managed Care – PPO | Admitting: *Deleted

## 2014-02-25 DIAGNOSIS — I2589 Other forms of chronic ischemic heart disease: Secondary | ICD-10-CM

## 2014-02-25 DIAGNOSIS — I509 Heart failure, unspecified: Secondary | ICD-10-CM

## 2014-02-25 DIAGNOSIS — I4891 Unspecified atrial fibrillation: Secondary | ICD-10-CM

## 2014-02-25 DIAGNOSIS — Z5181 Encounter for therapeutic drug level monitoring: Secondary | ICD-10-CM

## 2014-02-25 DIAGNOSIS — I5022 Chronic systolic (congestive) heart failure: Secondary | ICD-10-CM

## 2014-02-25 LAB — MDC_IDC_ENUM_SESS_TYPE_INCLINIC
Brady Statistic RV Percent Paced: 0 %
Date Time Interrogation Session: 20150415102125
HIGH POWER IMPEDANCE MEASURED VALUE: 51 Ohm
HighPow Impedance: 66 Ohm
Lead Channel Sensing Intrinsic Amplitude: 11.625 mV
Lead Channel Sensing Intrinsic Amplitude: 9.75 mV
Lead Channel Setting Pacing Amplitude: 2.5 V
Lead Channel Setting Sensing Sensitivity: 0.3 mV
MDC IDC MSMT BATTERY VOLTAGE: 3.09 V
MDC IDC MSMT LEADCHNL RV IMPEDANCE VALUE: 532 Ohm
MDC IDC SET LEADCHNL RV PACING PULSEWIDTH: 0.4 ms
MDC IDC SET ZONE DETECTION INTERVAL: 270 ms
Zone Setting Detection Interval: 320 ms
Zone Setting Detection Interval: 400 ms

## 2014-02-25 LAB — POCT INR: INR: 2.6

## 2014-02-25 NOTE — Progress Notes (Signed)
ICD check in clinic. Normal device function. Thresholds and sensing consistent with previous device measurements. Impedance trends stable over time. 83 NSVT episodes show SVT. Histogram distribution appropriate for patient and level of activity. No changes made this session. Device programmed at appropriate safety margins. Device programmed to optimize intrinsic conduction.  Optivol and thoracic impedance abnormal 3/13 ongoing.  The patient denies any swelling or SOB.   Patient education completed including shock plan. Alert tones/vibration demonstrated for patient.  ROV 6 months with Dr. Ladona Ridgel.

## 2014-03-20 ENCOUNTER — Encounter: Payer: Self-pay | Admitting: Internal Medicine

## 2014-04-01 ENCOUNTER — Other Ambulatory Visit: Payer: Self-pay | Admitting: *Deleted

## 2014-04-01 ENCOUNTER — Telehealth: Payer: Self-pay | Admitting: *Deleted

## 2014-04-01 MED ORDER — POTASSIUM CHLORIDE CRYS ER 10 MEQ PO TBCR: 10.0000 meq | EXTENDED_RELEASE_TABLET | Freq: Every day | ORAL | Status: DC

## 2014-04-01 MED ORDER — WARFARIN SODIUM 5 MG PO TABS: ORAL_TABLET | ORAL | Status: DC

## 2014-04-01 MED ORDER — LISINOPRIL 10 MG PO TABS: 10.0000 mg | ORAL_TABLET | Freq: Every day | ORAL | Status: DC

## 2014-04-01 NOTE — Telephone Encounter (Signed)
Patient requests coumadin refill be sent to [facility]. Thanks, MI 

## 2014-04-08 ENCOUNTER — Ambulatory Visit (INDEPENDENT_AMBULATORY_CARE_PROVIDER_SITE_OTHER): Payer: BC Managed Care – PPO | Admitting: Pharmacist

## 2014-04-08 DIAGNOSIS — Z5181 Encounter for therapeutic drug level monitoring: Secondary | ICD-10-CM

## 2014-04-08 DIAGNOSIS — I4891 Unspecified atrial fibrillation: Secondary | ICD-10-CM

## 2014-04-08 LAB — POCT INR: INR: 3

## 2014-04-22 ENCOUNTER — Encounter: Payer: Self-pay | Admitting: Cardiovascular Disease

## 2014-04-22 ENCOUNTER — Ambulatory Visit (INDEPENDENT_AMBULATORY_CARE_PROVIDER_SITE_OTHER): Payer: BC Managed Care – PPO | Admitting: Cardiovascular Disease

## 2014-04-22 ENCOUNTER — Other Ambulatory Visit: Payer: Self-pay | Admitting: *Deleted

## 2014-04-22 ENCOUNTER — Telehealth: Payer: Self-pay | Admitting: *Deleted

## 2014-04-22 VITALS — BP 140/98 | HR 84 | Ht 68.0 in | Wt 228.4 lb

## 2014-04-22 DIAGNOSIS — E785 Hyperlipidemia, unspecified: Secondary | ICD-10-CM

## 2014-04-22 DIAGNOSIS — I2510 Atherosclerotic heart disease of native coronary artery without angina pectoris: Secondary | ICD-10-CM

## 2014-04-22 DIAGNOSIS — I4891 Unspecified atrial fibrillation: Secondary | ICD-10-CM

## 2014-04-22 DIAGNOSIS — I5022 Chronic systolic (congestive) heart failure: Secondary | ICD-10-CM

## 2014-04-22 DIAGNOSIS — I509 Heart failure, unspecified: Secondary | ICD-10-CM

## 2014-04-22 LAB — BASIC METABOLIC PANEL
BUN: 14 mg/dL (ref 6–23)
CHLORIDE: 102 meq/L (ref 96–112)
CO2: 25 mEq/L (ref 19–32)
Calcium: 10 mg/dL (ref 8.4–10.5)
Creatinine, Ser: 0.8 mg/dL (ref 0.4–1.2)
GFR: 92.03 mL/min (ref >60.00)
GLUCOSE: 128 mg/dL — AB (ref 70–99)
POTASSIUM: 4 meq/L (ref 3.5–5.1)
Sodium: 138 mEq/L (ref 135–145)

## 2014-04-22 LAB — CBC WITH DIFFERENTIAL/PLATELET
Basophils Absolute: 0 10*3/uL (ref 0.0–0.1)
Basophils Relative: 0.5 % (ref 0.0–3.0)
EOS PCT: 1.3 % (ref 0.0–5.0)
Eosinophils Absolute: 0.1 10*3/uL (ref 0.0–0.7)
HCT: 32.2 % — ABNORMAL LOW (ref 36.0–46.0)
Hemoglobin: 10.4 g/dL — ABNORMAL LOW (ref 12.0–15.0)
Lymphocytes Relative: 12.4 % (ref 12.0–46.0)
Lymphs Abs: 0.9 10*3/uL (ref 0.7–4.0)
MCHC: 32.5 g/dL (ref 30.0–36.0)
MCV: 89 fl (ref 78.0–100.0)
MONO ABS: 0.6 10*3/uL (ref 0.1–1.0)
Monocytes Relative: 8.7 % (ref 3.0–12.0)
Neutro Abs: 5.3 10*3/uL (ref 1.4–7.7)
Neutrophils Relative %: 77.1 % — ABNORMAL HIGH (ref 43.0–77.0)
Platelets: 417 10*3/uL — ABNORMAL HIGH (ref 150.0–400.0)
RBC: 3.62 Mil/uL — AB (ref 3.87–5.11)
RDW: 15.6 % — ABNORMAL HIGH (ref 11.5–15.5)
WBC: 6.9 10*3/uL (ref 4.0–10.5)

## 2014-04-22 LAB — LIPID PANEL
CHOLESTEROL: 166 mg/dL (ref 0–200)
HDL: 50.6 mg/dL (ref >39.00)
LDL CALC: 86 mg/dL (ref 0–99)
NONHDL: 115.4
Total CHOL/HDL Ratio: 3
Triglycerides: 147 mg/dL (ref 0.0–149.0)
VLDL: 29.4 mg/dL (ref 0.0–40.0)

## 2014-04-22 LAB — HEPATIC FUNCTION PANEL
ALBUMIN: 4.4 g/dL (ref 3.5–5.2)
ALT: 19 U/L (ref 0–35)
AST: 24 U/L (ref 0–37)
Alkaline Phosphatase: 79 U/L (ref 39–117)
BILIRUBIN TOTAL: 1 mg/dL (ref 0.2–1.2)
Bilirubin, Direct: 0 mg/dL (ref 0.0–0.3)
Total Protein: 8.2 g/dL (ref 6.0–8.3)

## 2014-04-22 LAB — TSH: TSH: 0.43 u[IU]/mL (ref 0.35–4.50)

## 2014-04-22 MED ORDER — CARVEDILOL 25 MG PO TABS: 25.0000 mg | ORAL_TABLET | Freq: Two times a day (BID) | ORAL | Status: DC

## 2014-04-22 MED ORDER — LISINOPRIL 10 MG PO TABS: 10.0000 mg | ORAL_TABLET | Freq: Every day | ORAL | Status: DC

## 2014-04-22 MED ORDER — FUROSEMIDE 40 MG PO TABS: 40.0000 mg | ORAL_TABLET | Freq: Every day | ORAL | Status: DC

## 2014-04-22 MED ORDER — POTASSIUM CHLORIDE CRYS ER 10 MEQ PO TBCR: 10.0000 meq | EXTENDED_RELEASE_TABLET | Freq: Every day | ORAL | Status: DC

## 2014-04-22 MED ORDER — SIMVASTATIN 40 MG PO TABS: ORAL_TABLET | ORAL | Status: DC

## 2014-04-22 MED ORDER — AMIODARONE HCL 200 MG PO TABS
200.0000 mg | ORAL_TABLET | Freq: Every day | ORAL | Status: DC
Start: 2014-04-22 — End: 2014-07-01

## 2014-04-22 MED ORDER — CLOPIDOGREL BISULFATE 75 MG PO TABS: 75.0000 mg | ORAL_TABLET | Freq: Every day | ORAL | Status: DC

## 2014-04-22 MED ORDER — SPIRONOLACTONE 25 MG PO TABS: 25.0000 mg | ORAL_TABLET | Freq: Every day | ORAL | Status: DC

## 2014-04-22 NOTE — Assessment & Plan Note (Addendum)
[name] presents today with the sensation that she's nervous inside. Her heart rate is very irregular and rapid. EKG confirms that she is back in atrial fibrillation.  She has a history of coronary artery disease as well as chronic systolic congestive heart failure. She has an ICD in place. Her possible next that may include amiodarone or perhaps Tikosyn.      We discussed the fact that starting Tikosyn would involve a 3 day hospitalization vs. Amiodarone which could be started as OP but may have significant long term side effects.    She is on coumadin.   Will have her take an extra 1/2 coreg today when she gets home.    I discussed the issues with Dr. Ladona Ridgel. We will start her on amiodarone 200 mg twice a day [name] does not want to be hospitalized is to start any new medications. Coumadin clinic we'll need to adjust her Coumadin accordingly.  I will see her in 3 months.  - sooner if needed.

## 2014-04-22 NOTE — Telephone Encounter (Signed)
Your Physician recommends ypu start Amiodarone 200 mg twice daily  Nurse visit in 1 week  See Dr. Elease Hashimoto in 2 months

## 2014-04-22 NOTE — Progress Notes (Signed)
Felicia ModenaVonda K Davis Date of Birth  June 22, 1951  [facility] [address]    [address] [place], [place]  [id number] [phone]  Fax  [phone]  [facility] Office [address] [place], [place]  [address] [phone]   Fax [phone]  Problem list: 1. Coronary artery disease-status post anterior wall myocardial infarction. She status post stenting of her LAD in her Jan, 2010. We placed a 3.0 x 18 mm vision stent. Was post dilated with a 3.25 mm [place] Voyager 2. Congestive heart failure with EF of 30-35% associated with  an apical aneurysm. She's on Coumadin. 3. Status post AICD placement   4. Paroxysmal  atrial fibrillation 5. Dyslipidemia  History of Present Illness:  [name] is a 63 year old female with a history of coronary artery disease. She status post anterior wall myocardial infarction. She has congestive heart failure with an ejection fraction of around 35%.  She's done very well since I last saw her. He is exercising on a fairly regular basis. She's not had any episodes of chest or shortness breath.  She has done well.  - walks every day.  No chest pain or dyspnea.   She has not been as careful with her diet recently. She admits that she needs to work on a good low-fat, low carbohydrate diet.  April 15, 2013:  [name] is doing well.  No CP or dyspnea.  Still walks every day ( 3/4 of a mile).  She is due to have an echo.   Dec. 2, 2014:  [name] is doing well.  No CP or dyspnea.    Still walking (3 laps around the mall  Or 30 minutes)  Every other day.  Echocardiogram in June, 2014 sutures a persistently reduced left ventricular systolic function with an EF of around 30%. She has mild pulmonary hypertension with estimated PA pressure of 40. Left ventricle: Akinesis septum. Severe hypokinesis of the apex. The cavity size was normal. Wall thickness was increased in a pattern of mild LVH. The estimated ejection fraction was 30%. Findings consistent with  left ventricular diastolic dysfunction. - Mitral valve: Mild regurgitation. - Left atrium: The atrium was mildly dilated. - Pulmonary arteries: PA peak pressure: 40mm Hg    April 22, 2014:  [name] feels nervous today.  HR and BP are up. She denies any chest pain or shortness of breath. She has had atrial fibrillation before. EKG today reveals atrial fibrillation with a ventricular rate of 136.    Current Outpatient Prescriptions on File Prior to Visit  Medication Sig Dispense Refill  . aspirin 81 MG tablet Take 81 mg by mouth daily.        . carvedilol (COREG) 25 MG tablet Take 1 tablet (25 mg total) by mouth 2 (two) times daily with a meal.  60 tablet  6  . clopidogrel (PLAVIX) 75 MG tablet Take 1 tablet (75 mg total) by mouth daily.  30 tablet  6  . furosemide (LASIX) 40 MG tablet Take 1 tablet (40 mg total) by mouth daily.  30 tablet  5  . lisinopril (PRINIVIL,ZESTRIL) 10 MG tablet Take 1 tablet (10 mg total) by mouth daily.  30 tablet  0  . potassium chloride (KLOR-CON M10) 10 MEQ tablet Take 1 tablet (10 mEq total) by mouth daily.  30 tablet  0  . simvastatin (ZOCOR) 40 MG tablet TAKE ONE TABLET BY MOUTH AT BEDTIME  30 tablet  6  . spironolactone (ALDACTONE) 25 MG tablet Take 1 tablet (25  mg total) by mouth daily.  30 tablet  6  . warfarin (COUMADIN) 5 MG tablet Take as directed by coumadin clinic  30 tablet  3   No current facility-administered medications on file prior to visit.    No Known Allergies  Past Medical History  Diagnosis Date  . Coronary artery disease 12/12/2008    STATUS POST ANTERIOR WALL MYOCARDIAL INFARCTION. STATUS POST PTCA AND STENTING OF HER LAD  . CHF (congestive heart failure)     EF 30-35% BY ECHO. SHE HAS AN APICAL ANEURYSM  . Transient atrial fibrillation or flutter   . Dyslipidemia     Past Surgical History  Procedure Laterality Date  . Cardiac defibrillator placement      History  Smoking status  . Former Smoker  . Quit date: 12/14/2008   Smokeless tobacco  . Not on file    History  Alcohol Use No    No family history on file.  Reviw of Systems:  Reviewed in the HPI.  All other systems are negative.  Physical Exam: BP 140/98  Pulse 84  Ht 5\' 8"  (1.727 m)  Wt 228 lb 6.4 oz (103.602 kg)  BMI 34.74 kg/m2 The patient is alert and oriented x 3.  The mood and affect are normal.   Skin: warm and dry.  Color is normal.    HEENT:   Normocephalic/atraumatic. Her neck is supple. There is no JVD.  Lungs: Lungs are clear to auscultation.   Heart: Regular rate S1-S2.    Abdomen: Shows good bowel sounds. No tenderness  Extremities:  No clubbing cyanosis or edema.  Neuro:  There exam is nonfocal.    ECG: April 22, 2014:  Atrial fib at rate of 136.   Assessment / Plan:

## 2014-04-22 NOTE — Patient Instructions (Addendum)
Your physician recommends that you have lab work today: TSH,CBC,BMP,LIPID,LIVER  Your physician recommends that you schedule a follow-up appointment in: 3 months with Dr. Elease Hashimoto  Your physician has recommended you make the following change in your medication:  TAKE A EXTRA 1/2 OF CARVEDILOL TODAY.....WE WILL CONTACT YOU AFTER DR.NAHSER AND DR. Ladona Ridgel REVIEWS YOUR HISTORY

## 2014-04-22 NOTE — Assessment & Plan Note (Signed)
Seems to be stable. 

## 2014-04-22 NOTE — Telephone Encounter (Signed)
Spoke with pt aware of instructions and appointments

## 2014-04-22 NOTE — Assessment & Plan Note (Signed)
She's not having any episodes of angina.

## 2014-04-22 NOTE — Addendum Note (Signed)
Addended by: Kem Parkinson on: 04/22/2014 05:04 PM   Modules accepted: Orders

## 2014-04-23 ENCOUNTER — Other Ambulatory Visit: Payer: Self-pay

## 2014-04-23 ENCOUNTER — Other Ambulatory Visit: Payer: Self-pay | Admitting: *Deleted

## 2014-04-23 ENCOUNTER — Telehealth: Payer: Self-pay

## 2014-04-23 DIAGNOSIS — E785 Hyperlipidemia, unspecified: Secondary | ICD-10-CM

## 2014-04-23 MED ORDER — ATORVASTATIN CALCIUM 20 MG PO TABS: 20.0000 mg | ORAL_TABLET | Freq: Every day | ORAL | Status: DC

## 2014-04-23 NOTE — Telephone Encounter (Signed)
I called the patient to let her know that Dr Melburn Popper changed her from simvastatin 40 mg to atorvastatin 20 mg because of the interaction with amiodarone. She verbal understand

## 2014-04-28 ENCOUNTER — Telehealth: Payer: Self-pay | Admitting: Pharmacist

## 2014-04-28 NOTE — Telephone Encounter (Signed)
Patient was started on amidarone 200 mg bid on 04/22/14.  INR was 3.0 in 03/2014 at her last check.  Typical warfarin dose is 5 mg qd except 2.5 mg T.  Patient notified to hold warfarin today (Tuesday - 04/28/14), take 2.5 mg tomorrow (Wednesday), and recheck INR here in 2 days with her EKG appointment.

## 2014-04-30 ENCOUNTER — Ambulatory Visit (INDEPENDENT_AMBULATORY_CARE_PROVIDER_SITE_OTHER): Payer: BC Managed Care – PPO | Admitting: *Deleted

## 2014-04-30 VITALS — BP 130/60 | HR 75

## 2014-04-30 DIAGNOSIS — Z5181 Encounter for therapeutic drug level monitoring: Secondary | ICD-10-CM

## 2014-04-30 DIAGNOSIS — I480 Paroxysmal atrial fibrillation: Secondary | ICD-10-CM

## 2014-04-30 DIAGNOSIS — I4891 Unspecified atrial fibrillation: Secondary | ICD-10-CM

## 2014-04-30 LAB — POCT INR: INR: 1.7

## 2014-04-30 NOTE — Progress Notes (Signed)
1.) Reason for visit: EKG  2.) Name of MD requesting visit: dr Elease Hashimoto  3.) H&P: pt was recently seen with recurrent atrial fib. She was started on amiodarone 200 mg bid. The script that was sent to the pharm was amiodarone 200 mg once daily. The pt has been taking 200 mg of amiodarone daily. She is here for repeat EKG to confirm rhythm.  4.) ROS related to problem: vital signs are stable and pt reports she feels great. No complaints. EKG confirms sinus rhythm.  5.) Assessment and plan per MD: EKG confirmed with dr cooper to be sinus. Pt will cont on the amiodarone 200 mg once daily.                      Pt agreed with this plan.

## 2014-05-18 ENCOUNTER — Ambulatory Visit (INDEPENDENT_AMBULATORY_CARE_PROVIDER_SITE_OTHER): Payer: BC Managed Care – PPO

## 2014-05-18 DIAGNOSIS — Z5181 Encounter for therapeutic drug level monitoring: Secondary | ICD-10-CM

## 2014-05-18 DIAGNOSIS — I4891 Unspecified atrial fibrillation: Secondary | ICD-10-CM

## 2014-05-18 LAB — POCT INR: INR: 2.5

## 2014-06-01 ENCOUNTER — Ambulatory Visit (INDEPENDENT_AMBULATORY_CARE_PROVIDER_SITE_OTHER): Payer: BC Managed Care – PPO | Admitting: Surgery

## 2014-06-01 DIAGNOSIS — Z5181 Encounter for therapeutic drug level monitoring: Secondary | ICD-10-CM

## 2014-06-01 DIAGNOSIS — I4891 Unspecified atrial fibrillation: Secondary | ICD-10-CM

## 2014-06-01 LAB — POCT INR: INR: 2.3

## 2014-07-01 ENCOUNTER — Encounter: Payer: Self-pay | Admitting: Cardiovascular Disease

## 2014-07-01 ENCOUNTER — Ambulatory Visit (INDEPENDENT_AMBULATORY_CARE_PROVIDER_SITE_OTHER): Payer: BC Managed Care – PPO | Admitting: *Deleted

## 2014-07-01 ENCOUNTER — Ambulatory Visit (INDEPENDENT_AMBULATORY_CARE_PROVIDER_SITE_OTHER): Payer: BC Managed Care – PPO | Admitting: Cardiovascular Disease

## 2014-07-01 VITALS — BP 122/80 | HR 80 | Ht 68.0 in | Wt 228.0 lb

## 2014-07-01 DIAGNOSIS — E785 Hyperlipidemia, unspecified: Secondary | ICD-10-CM

## 2014-07-01 DIAGNOSIS — I5022 Chronic systolic (congestive) heart failure: Secondary | ICD-10-CM

## 2014-07-01 DIAGNOSIS — I2510 Atherosclerotic heart disease of native coronary artery without angina pectoris: Secondary | ICD-10-CM

## 2014-07-01 DIAGNOSIS — I480 Paroxysmal atrial fibrillation: Secondary | ICD-10-CM

## 2014-07-01 DIAGNOSIS — I4891 Unspecified atrial fibrillation: Secondary | ICD-10-CM

## 2014-07-01 DIAGNOSIS — I509 Heart failure, unspecified: Secondary | ICD-10-CM

## 2014-07-01 DIAGNOSIS — Z5181 Encounter for therapeutic drug level monitoring: Secondary | ICD-10-CM

## 2014-07-01 LAB — HEPATIC FUNCTION PANEL
ALK PHOS: 89 U/L (ref 39–117)
ALT: 23 U/L (ref 0–35)
AST: 23 U/L (ref 0–37)
Albumin: 4.4 g/dL (ref 3.5–5.2)
BILIRUBIN DIRECT: 0.1 mg/dL (ref 0.0–0.3)
TOTAL PROTEIN: 8.3 g/dL (ref 6.0–8.3)
Total Bilirubin: 0.9 mg/dL (ref 0.2–1.2)

## 2014-07-01 LAB — LIPID PANEL
CHOL/HDL RATIO: 3
Cholesterol: 165 mg/dL (ref 0–200)
HDL: 52.4 mg/dL (ref >39.00)
LDL Cholesterol: 79 mg/dL (ref 0–99)
NonHDL: 112.6
TRIGLYCERIDES: 170 mg/dL — AB (ref 0.0–149.0)
VLDL: 34 mg/dL (ref 0.0–40.0)

## 2014-07-01 LAB — BASIC METABOLIC PANEL
BUN: 16 mg/dL (ref 6–23)
CALCIUM: 9.7 mg/dL (ref 8.4–10.5)
CO2: 28 mEq/L (ref 19–32)
Chloride: 99 mEq/L (ref 96–112)
Creatinine, Ser: 1 mg/dL (ref 0.4–1.2)
GFR: 72.12 mL/min (ref >60.00)
Glucose, Bld: 107 mg/dL — ABNORMAL HIGH (ref 70–99)
Potassium: 3.9 mEq/L (ref 3.5–5.1)
Sodium: 137 mEq/L (ref 135–145)

## 2014-07-01 LAB — POCT INR: INR: 2

## 2014-07-01 MED ORDER — AMIODARONE HCL 200 MG PO TABS: ORAL_TABLET | ORAL | Status: DC

## 2014-07-01 NOTE — Assessment & Plan Note (Signed)
She has a regular HR today.    Will decrease her amiodarone to 200 mg 4 days a week ( suggest MWFS)  Will get an ecg next office visit.

## 2014-07-01 NOTE — Assessment & Plan Note (Signed)
[name] is doing well.  Continue same meds for CHF.

## 2014-07-01 NOTE — Progress Notes (Signed)
[name] [name] Davis Date of Birth  1951/03/22 [name] [name]    [address] [place], [place]  [address] [phone]  Fax  [phone]  [facility] [address] [place], [place]  [address] [phone]   Fax [phone]  Problem list: 1. Coronary artery disease-status post anterior wall myocardial infarction. She status post stenting of her LAD in her Jan, 2010. We placed a 3.0 x 18 mm vision stent. Was post dilated with a 3.25 mm [place] Voyager 2. Congestive heart failure with EF of 30-35% associated with  an apical aneurysm. She's on Coumadin. 3. Status post AICD placement   4. Paroxysmal  atrial fibrillation 5. Dyslipidemia  History of Present Illness:  [name] Davis is a 63 year old female with a history of coronary artery disease. She status post anterior wall myocardial infarction. She has congestive heart failure with an ejection fraction of around 35%.  She's done very well since I last saw her. He is exercising on a fairly regular basis. She's not had any episodes of chest or shortness breath.  She has done well.  - walks every day.  No chest pain or dyspnea.   She has not been as careful with her diet recently. She admits that she needs to work on a good low-fat, low carbohydrate diet.  April 15, 2013:  [name] Davis is doing well.  No CP or dyspnea.  Still walks every day ( 3/4 of a mile).  She is due to have an echo.   Dec. 2, 2014:  [name] Davis is doing well.  No CP or dyspnea.    Still walking (3 laps [place]  Or 30 minutes)  Every other day.  Echocardiogram in June, 2014 sutures a persistently reduced left ventricular systolic function with an EF of around 30%. She has mild pulmonary hypertension with estimated PA pressure of 40. Left ventricle: Akinesis septum. Severe hypokinesis of the apex. The cavity size was normal. Wall thickness was increased in a pattern of mild LVH. The estimated ejection fraction was 30%. Findings consistent with  left ventricular diastolic dysfunction. - Mitral valve: Mild regurgitation. - Left atrium: The atrium was mildly dilated. - Pulmonary arteries: PA peak pressure: 40mm Hg    April 22, 2014:  [name] feels nervous today.  HR and BP are up. She denies any chest pain or shortness of breath. She has had atrial fibrillation before. EKG today reveals atrial fibrillation with a ventricular rate of 136.  July 01, 2014:  [name] Davis is doing . She's not having episodes of chest pain or shortness of breath.  She's able to get out and exercise on a fairly regular basis.   Current Outpatient Prescriptions on File Prior to Visit  Medication Sig Dispense Refill  . amiodarone (PACERONE) 200 MG tablet Take 1 tablet (200 mg total) by mouth daily.  180 tablet  2  . aspirin 81 MG tablet Take 81 mg by mouth daily.        Marland Kitchen. atorvastatin (LIPITOR) 20 MG tablet Take 1 tablet (20 mg total) by mouth daily.  90 tablet  3  . carvedilol (COREG) 25 MG tablet Take 1 tablet (25 mg total) by mouth 2 (two) times daily with a meal.  60 tablet  6  . clopidogrel (PLAVIX) 75 MG tablet Take 1 tablet (75 mg total) by mouth daily.  30 tablet  6  . furosemide (LASIX) 40 MG tablet Take 1 tablet (40 mg total) by mouth daily.  30 tablet  5  .  lisinopril (PRINIVIL,ZESTRIL) 10 MG tablet Take 1 tablet (10 mg total) by mouth daily.  30 tablet  6  . potassium chloride (KLOR-CON M10) 10 MEQ tablet Take 1 tablet (10 mEq total) by mouth daily.  30 tablet  6  . spironolactone (ALDACTONE) 25 MG tablet Take 1 tablet (25 mg total) by mouth daily.  30 tablet  6  . warfarin (COUMADIN) 5 MG tablet Take as directed by coumadin clinic  30 tablet  3   No current facility-administered medications on file prior to visit.    No Known Allergies  Past Medical History  Diagnosis Date  . Coronary artery disease 12/12/2008    STATUS POST ANTERIOR WALL MYOCARDIAL INFARCTION. STATUS POST PTCA AND STENTING OF HER LAD  . CHF (congestive heart failure)     EF  30-35% BY ECHO. SHE HAS AN APICAL ANEURYSM  . Transient atrial fibrillation or flutter   . Dyslipidemia     Past Surgical History  Procedure Laterality Date  . Cardiac defibrillator placement      History  Smoking status  . Former Smoker  . Quit date: 12/14/2008  Smokeless tobacco  . Not on file    History  Alcohol Use No    No family history on file.  Reviw of Systems:  Reviewed in the HPI.  All other systems are negative.  Physical Exam: BP 122/80  Pulse 80  Ht 5\' 8"  (1.727 m)  Wt 228 lb (103.42 kg)  BMI 34.68 kg/m2 The patient is alert and oriented x 3.  The mood and affect are normal.   Skin: warm and dry.  Color is normal.    HEENT:   Normocephalic/atraumatic. Her neck is supple. There is no JVD.  Lungs: Lungs are clear to auscultation.   Heart: Regular rate S1-S2.    Abdomen: Shows good bowel sounds. No tenderness  Extremities:  No clubbing cyanosis or edema.  Neuro:  There exam is nonfocal.    ECG:   Assessment / Plan:

## 2014-07-01 NOTE — Assessment & Plan Note (Signed)
No angina 

## 2014-07-01 NOTE — Patient Instructions (Addendum)
Your physician has recommended you make the following change in your medication:  DECREASE Amiodarone to 200 mg 4 days per week (Mon, Wed, Fri, Sat)  Your physician wants you to follow-up in: 6 months with Dr. Elease Hashimoto.  You will receive a reminder letter in the mail two months in advance. If you don't receive a letter, please call our office to schedule the follow-up appointment.

## 2014-07-15 ENCOUNTER — Ambulatory Visit (INDEPENDENT_AMBULATORY_CARE_PROVIDER_SITE_OTHER): Payer: BC Managed Care – PPO | Admitting: *Deleted

## 2014-07-15 DIAGNOSIS — I4891 Unspecified atrial fibrillation: Secondary | ICD-10-CM

## 2014-07-15 DIAGNOSIS — Z5181 Encounter for therapeutic drug level monitoring: Secondary | ICD-10-CM

## 2014-07-15 LAB — POCT INR: INR: 2

## 2014-07-29 ENCOUNTER — Ambulatory Visit: Payer: BC Managed Care – PPO | Admitting: Cardiovascular Disease

## 2014-08-05 ENCOUNTER — Ambulatory Visit (INDEPENDENT_AMBULATORY_CARE_PROVIDER_SITE_OTHER): Payer: BC Managed Care – PPO | Admitting: *Deleted

## 2014-08-05 DIAGNOSIS — Z5181 Encounter for therapeutic drug level monitoring: Secondary | ICD-10-CM

## 2014-08-05 DIAGNOSIS — I4891 Unspecified atrial fibrillation: Secondary | ICD-10-CM

## 2014-08-05 LAB — POCT INR: INR: 1.9

## 2014-09-02 ENCOUNTER — Encounter: Payer: Self-pay | Admitting: Internal Medicine

## 2014-09-02 ENCOUNTER — Ambulatory Visit (INDEPENDENT_AMBULATORY_CARE_PROVIDER_SITE_OTHER): Payer: BC Managed Care – PPO | Admitting: Internal Medicine

## 2014-09-02 ENCOUNTER — Ambulatory Visit (INDEPENDENT_AMBULATORY_CARE_PROVIDER_SITE_OTHER): Payer: BC Managed Care – PPO | Admitting: *Deleted

## 2014-09-02 VITALS — BP 122/64 | HR 77 | Ht 71.0 in | Wt 233.0 lb

## 2014-09-02 DIAGNOSIS — I255 Ischemic cardiomyopathy: Secondary | ICD-10-CM

## 2014-09-02 DIAGNOSIS — Z95810 Presence of automatic (implantable) cardiac defibrillator: Secondary | ICD-10-CM

## 2014-09-02 DIAGNOSIS — I480 Paroxysmal atrial fibrillation: Secondary | ICD-10-CM

## 2014-09-02 DIAGNOSIS — Z4502 Encounter for adjustment and management of automatic implantable cardiac defibrillator: Secondary | ICD-10-CM

## 2014-09-02 DIAGNOSIS — Z5181 Encounter for therapeutic drug level monitoring: Secondary | ICD-10-CM

## 2014-09-02 DIAGNOSIS — I4891 Unspecified atrial fibrillation: Secondary | ICD-10-CM

## 2014-09-02 DIAGNOSIS — I5022 Chronic systolic (congestive) heart failure: Secondary | ICD-10-CM

## 2014-09-02 LAB — MDC_IDC_ENUM_SESS_TYPE_INCLINIC
Brady Statistic RV Percent Paced: 0 %
Date Time Interrogation Session: 20151021101331
HIGH POWER IMPEDANCE MEASURED VALUE: 53 Ohm
HIGH POWER IMPEDANCE MEASURED VALUE: 72 Ohm
Lead Channel Impedance Value: 551 Ohm
Lead Channel Pacing Threshold Amplitude: 0.75 V
Lead Channel Pacing Threshold Pulse Width: 0.4 ms
Lead Channel Sensing Intrinsic Amplitude: 14.75 mV
Lead Channel Setting Pacing Amplitude: 2.5 V
Lead Channel Setting Sensing Sensitivity: 0.3 mV
MDC IDC MSMT BATTERY VOLTAGE: 3.08 V
MDC IDC MSMT LEADCHNL RV SENSING INTR AMPL: 9.5 mV
MDC IDC SET LEADCHNL RV PACING PULSEWIDTH: 0.4 ms
MDC IDC SET ZONE DETECTION INTERVAL: 270 ms
MDC IDC SET ZONE DETECTION INTERVAL: 400 ms
Zone Setting Detection Interval: 320 ms

## 2014-09-02 LAB — POCT INR: INR: 2.3

## 2014-09-02 MED ORDER — WARFARIN SODIUM 5 MG PO TABS: ORAL_TABLET | ORAL | Status: DC

## 2014-09-02 NOTE — Progress Notes (Signed)
HPI [name] returns today for followup. She is a 63 year old woman with a history of chronic systolic heart failure, an ischemic cardiomyopathy, paroxysmal atrial fibrillation, status post ICD implantation. In the interim, she has done well. She denies chest pain, shortness of breath, or syncope. She does admit to some dietary indiscretion, and has not lost any weight.  No Known Allergies   Current Outpatient Prescriptions  Medication Sig Dispense Refill  . amiodarone (PACERONE) 200 MG tablet Take 1 tab (200 mg) 4 days per week - Monday, Wednesday, Friday, Saturday  180 tablet  2  . aspirin 81 MG tablet Take 81 mg by mouth daily.        Marland Kitchen atorvastatin (LIPITOR) 20 MG tablet Take 1 tablet (20 mg total) by mouth daily.  90 tablet  3  . carvedilol (COREG) 25 MG tablet Take 1 tablet (25 mg total) by mouth 2 (two) times daily with a meal.  60 tablet  6  . clopidogrel (PLAVIX) 75 MG tablet Take 1 tablet (75 mg total) by mouth daily.  30 tablet  6  . furosemide (LASIX) 40 MG tablet Take 1 tablet (40 mg total) by mouth daily.  30 tablet  5  . lisinopril (PRINIVIL,ZESTRIL) 10 MG tablet Take 1 tablet (10 mg total) by mouth daily.  30 tablet  6  . potassium chloride (KLOR-CON M10) 10 MEQ tablet Take 1 tablet (10 mEq total) by mouth daily.  30 tablet  6  . spironolactone (ALDACTONE) 25 MG tablet Take 1 tablet (25 mg total) by mouth daily.  30 tablet  6  . warfarin (COUMADIN) 5 MG tablet Take as directed by coumadin clinic  30 tablet  3   No current facility-administered medications for this visit.     Past Medical History  Diagnosis Date  . Coronary artery disease 12/12/2008    STATUS POST ANTERIOR WALL MYOCARDIAL INFARCTION. STATUS POST PTCA AND STENTING OF HER LAD  . CHF (congestive heart failure)     EF 30-35% BY ECHO. SHE HAS AN APICAL ANEURYSM  . Transient atrial fibrillation or flutter   . Dyslipidemia     ROS:   All systems reviewed and negative except as noted in the  HPI.   Past Surgical History  Procedure Laterality Date  . Cardiac defibrillator placement       Family History  Problem Relation Age of Onset  . Hypertension Mother   . Heart disease Sister   . Heart disease Brother      History   Social History  . Marital Status: Married    Spouse Name: N/A    Number of Children: N/A  . Years of Education: N/A   Occupational History  . Not on file.   Social History Main Topics  . Smoking status: Former Smoker    Quit date: 12/14/2008  . Smokeless tobacco: Not on file  . Alcohol Use: No  . Drug Use: No  . Sexual Activity: Not on file   Other Topics Concern  . Not on file   Social History Narrative  . No narrative on file     BP 122/64  Pulse 77  Ht 5\' 11"  (1.803 m)  Wt 233 lb (105.688 kg)  BMI 32.51 kg/m2  Physical Exam:  Well appearing middle-age woman, obese, NAD HEENT: Unremarkable Neck:  No JVD, no thyromegally Back:  No CVA tenderness Lungs:  Clear with no wheezes, rales, or rhonchi. HEART:  Regular rate rhythm, no murmurs, no  rubs, no clicks Abd:  soft, obese, positive bowel sounds, no organomegally, no rebound, no guarding Ext:  2 plus pulses, no edema, no cyanosis, no clubbing Skin:  No rashes no nodules Neuro:  CN II through XII intact, motor grossly intact   DEVICE  Normal device function.  See PaceArt for details.   Assess/Plan:

## 2014-09-02 NOTE — Assessment & Plan Note (Signed)
Her symptoms are class IIA. No change in medical therapy.

## 2014-09-02 NOTE — Assessment & Plan Note (Signed)
The patient does not have angina. We discussed whether she needs to continue taking aspirin and Plavix in the setting of Coumadin therapy for atrial fibrillation. I will discuss with her primary cardiologist.

## 2014-09-02 NOTE — Assessment & Plan Note (Signed)
She is maintaining sinus rhythm on low-dose amiodarone. No change in medical therapy.

## 2014-09-02 NOTE — Assessment & Plan Note (Signed)
Her Medtronicsingle chamber ICD is working normally. We'll plan to recheck in several months. 

## 2014-09-02 NOTE — Patient Instructions (Addendum)
Your physician recommends that you schedule a follow-up appointment in: 6 months with [facility] and 12 months with Dr.Taylor  Your physician recommends that you continue on your current medications as directed. Please refer to the Current Medication list given to you today.

## 2014-09-30 ENCOUNTER — Ambulatory Visit (INDEPENDENT_AMBULATORY_CARE_PROVIDER_SITE_OTHER): Payer: BC Managed Care – PPO | Admitting: *Deleted

## 2014-09-30 DIAGNOSIS — I480 Paroxysmal atrial fibrillation: Secondary | ICD-10-CM

## 2014-09-30 DIAGNOSIS — Z5181 Encounter for therapeutic drug level monitoring: Secondary | ICD-10-CM

## 2014-09-30 DIAGNOSIS — I4891 Unspecified atrial fibrillation: Secondary | ICD-10-CM

## 2014-09-30 LAB — POCT INR: INR: 2.5

## 2014-10-14 ENCOUNTER — Ambulatory Visit: Payer: Self-pay | Admitting: Internal Medicine

## 2014-10-26 IMAGING — MG MM DIGITAL SCREENING BILAT W/ CAD
1 series · 4 of 4 positions shown · non-contrast
Comparison: Previous exam(s).

CLINICAL DATA: Screening.

EXAM:
DIGITAL SCREENING BILATERAL MAMMOGRAM WITH CAD

[L CC · left · 4 of 4 slices shown]
[im 1/4]
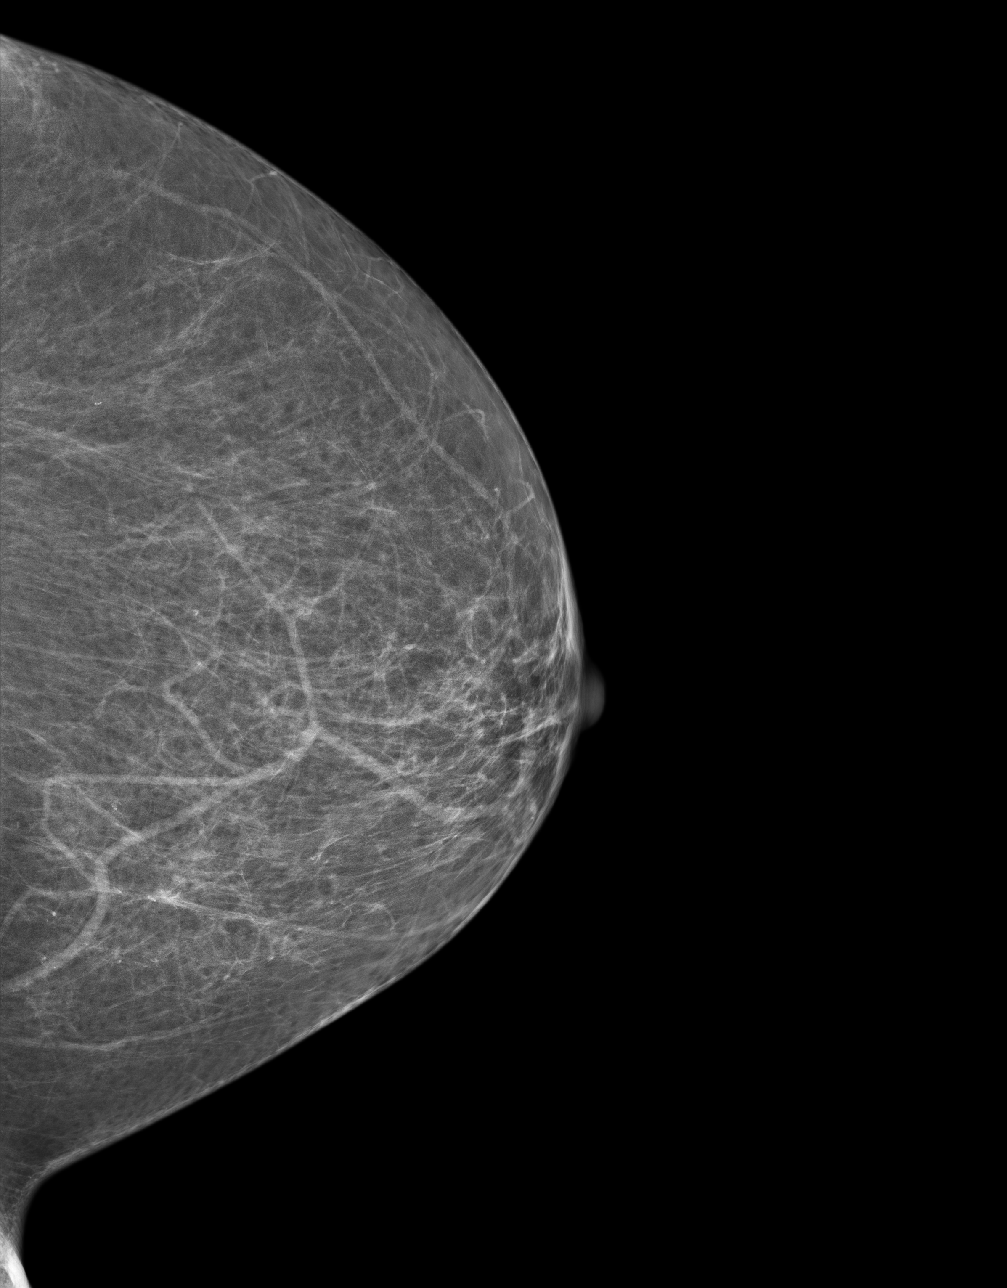
[im 2/4]
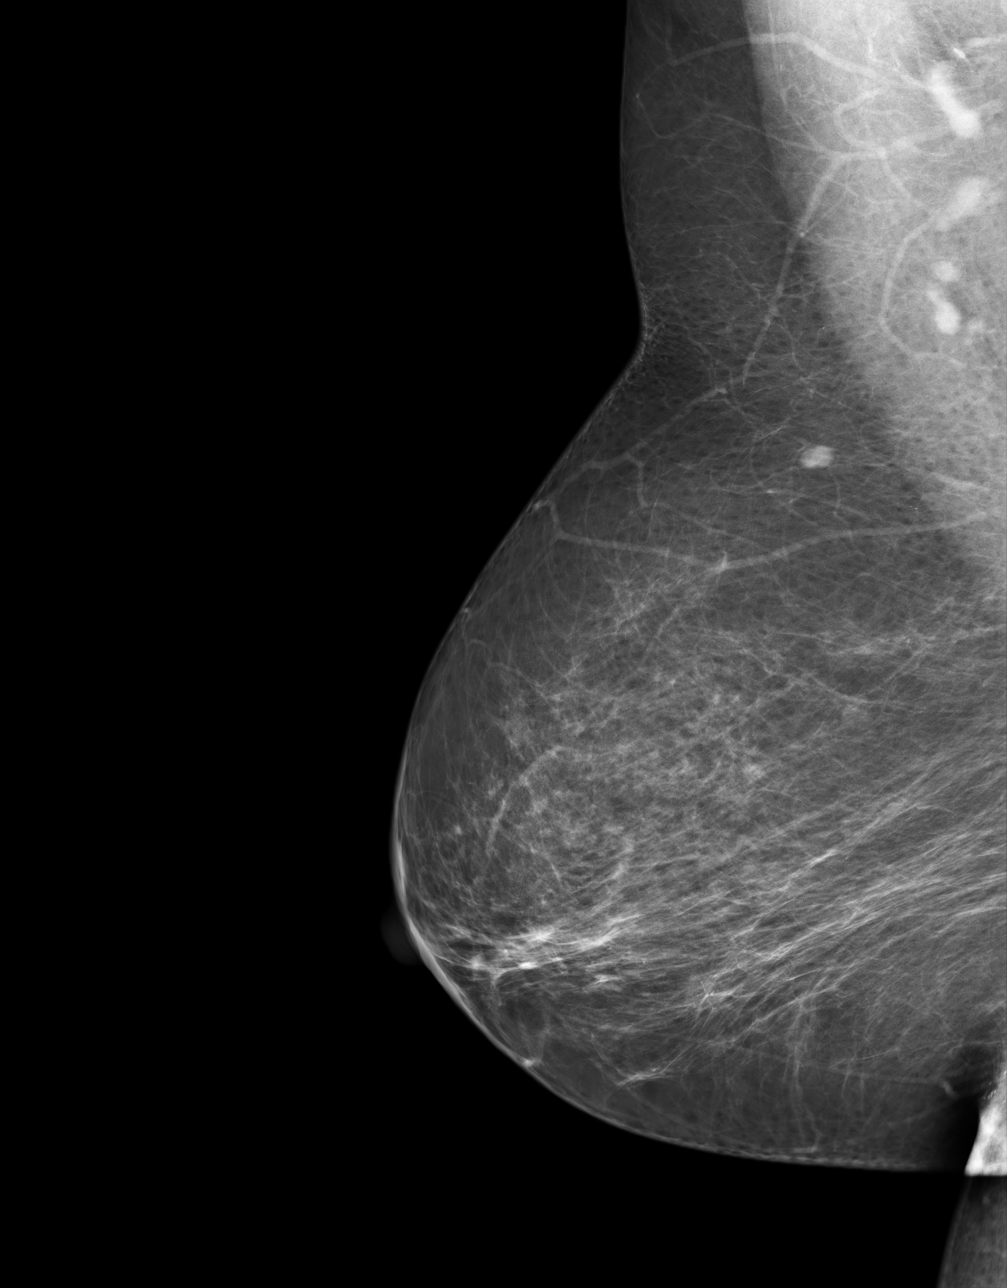
[im 3/4]
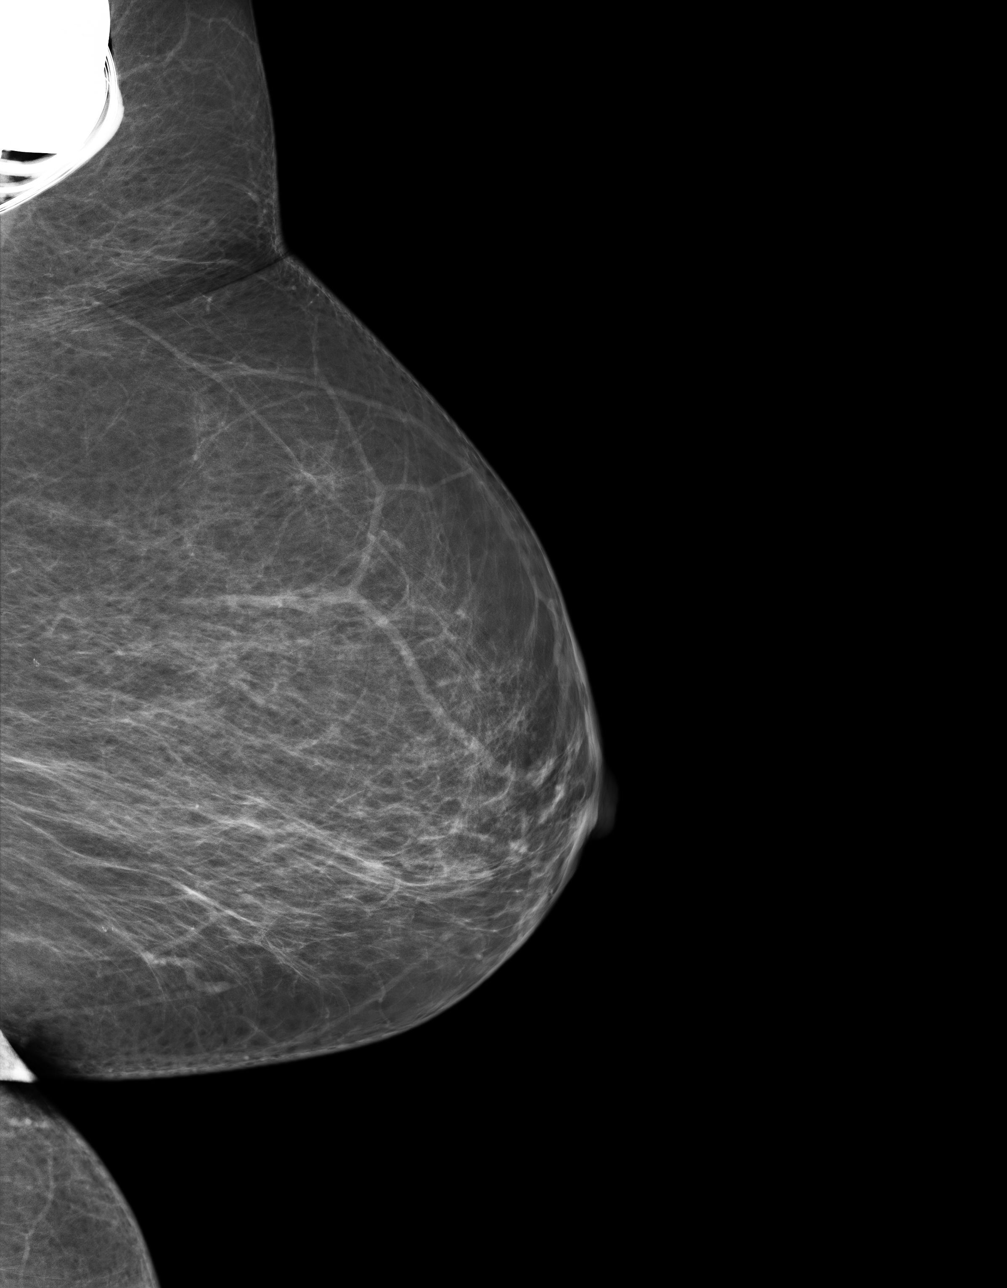
[im 4/4]
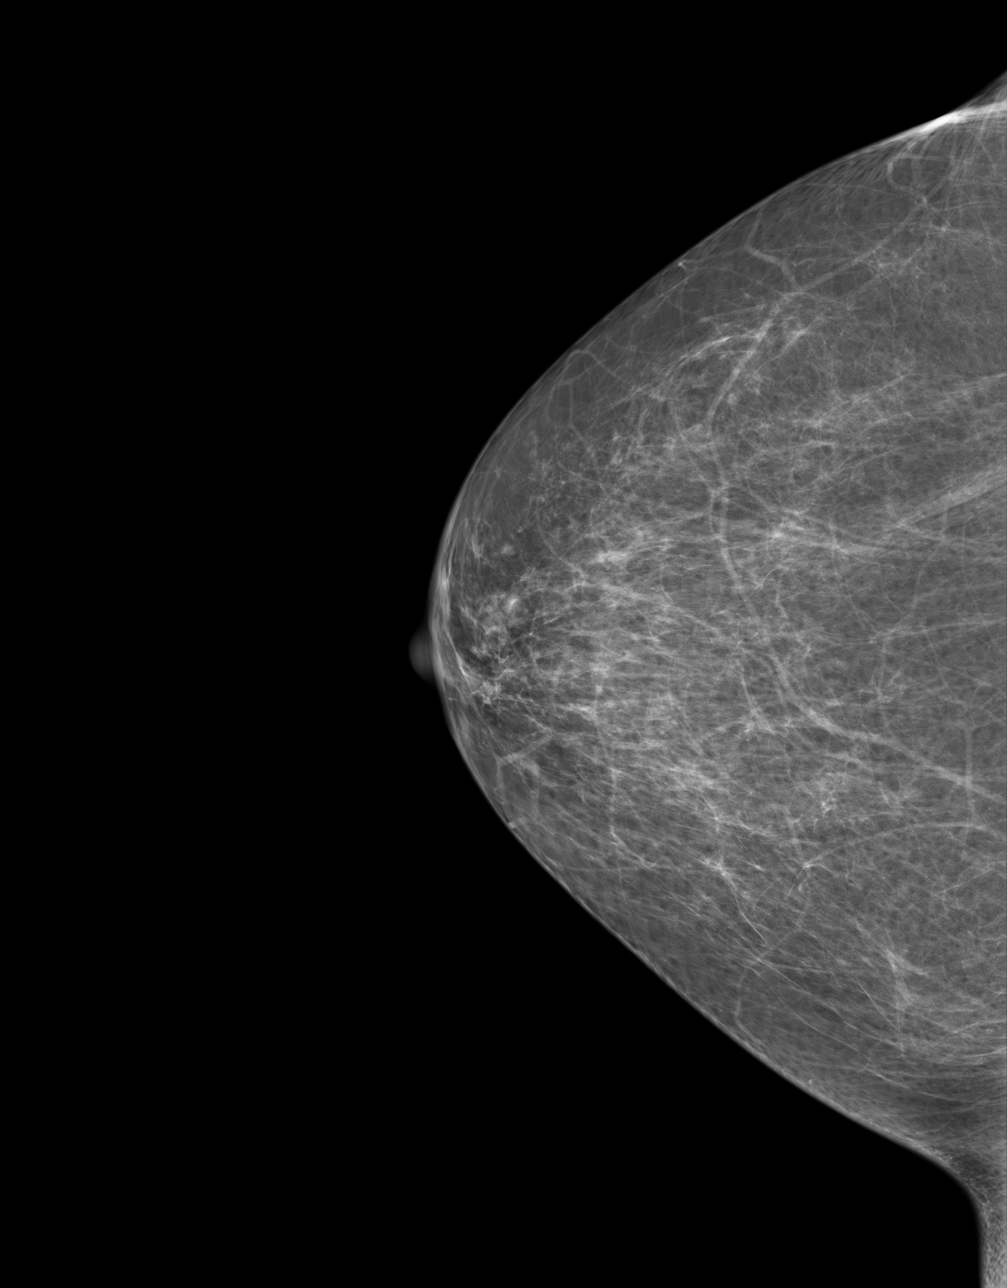

[4 of 4 positions shown; findings below may reference images not displayed]

ACR Breast Density Category b: There are scattered areas of
fibroglandular density.
FINDINGS: There are no findings suspicious for malignancy. Images were
processed with CAD.
IMPRESSION: No mammographic evidence of malignancy. A result letter of this
screening mammogram will be mailed directly to the patient.

RECOMMENDATION:
Screening mammogram in one year. (Code:[id number])

BI-RADS CATEGORY  1: Negative

## 2014-11-02 ENCOUNTER — Other Ambulatory Visit: Payer: Self-pay | Admitting: *Deleted

## 2014-11-02 MED ORDER — WARFARIN SODIUM 5 MG PO TABS: ORAL_TABLET | ORAL | Status: DC

## 2014-11-04 ENCOUNTER — Ambulatory Visit (INDEPENDENT_AMBULATORY_CARE_PROVIDER_SITE_OTHER): Payer: BC Managed Care – PPO | Admitting: *Deleted

## 2014-11-04 DIAGNOSIS — I4891 Unspecified atrial fibrillation: Secondary | ICD-10-CM

## 2014-11-04 DIAGNOSIS — I480 Paroxysmal atrial fibrillation: Secondary | ICD-10-CM

## 2014-11-04 DIAGNOSIS — Z5181 Encounter for therapeutic drug level monitoring: Secondary | ICD-10-CM

## 2014-11-04 LAB — POCT INR: INR: 2.8

## 2014-12-02 ENCOUNTER — Other Ambulatory Visit: Payer: Self-pay

## 2014-12-02 DIAGNOSIS — E785 Hyperlipidemia, unspecified: Secondary | ICD-10-CM

## 2014-12-02 MED ORDER — SPIRONOLACTONE 25 MG PO TABS: 25.0000 mg | ORAL_TABLET | Freq: Every day | ORAL | Status: DC

## 2014-12-02 MED ORDER — FUROSEMIDE 40 MG PO TABS: 40.0000 mg | ORAL_TABLET | Freq: Every day | ORAL | Status: DC

## 2014-12-02 MED ORDER — CARVEDILOL 25 MG PO TABS: 25.0000 mg | ORAL_TABLET | Freq: Two times a day (BID) | ORAL | Status: DC

## 2014-12-02 MED ORDER — LISINOPRIL 10 MG PO TABS: 10.0000 mg | ORAL_TABLET | Freq: Every day | ORAL | Status: DC

## 2014-12-02 MED ORDER — POTASSIUM CHLORIDE CRYS ER 10 MEQ PO TBCR: 10.0000 meq | EXTENDED_RELEASE_TABLET | Freq: Every day | ORAL | Status: DC

## 2014-12-16 ENCOUNTER — Ambulatory Visit (INDEPENDENT_AMBULATORY_CARE_PROVIDER_SITE_OTHER): Payer: BC Managed Care – PPO | Admitting: Surgery

## 2014-12-16 DIAGNOSIS — Z5181 Encounter for therapeutic drug level monitoring: Secondary | ICD-10-CM

## 2014-12-16 DIAGNOSIS — I480 Paroxysmal atrial fibrillation: Secondary | ICD-10-CM

## 2014-12-16 DIAGNOSIS — I4891 Unspecified atrial fibrillation: Secondary | ICD-10-CM

## 2014-12-16 LAB — POCT INR: INR: 2.3

## 2015-01-05 ENCOUNTER — Other Ambulatory Visit: Payer: Self-pay | Admitting: Cardiovascular Disease

## 2015-01-08 ENCOUNTER — Encounter: Payer: Self-pay | Admitting: *Deleted

## 2015-01-21 ENCOUNTER — Other Ambulatory Visit: Payer: Self-pay

## 2015-01-21 MED ORDER — CLOPIDOGREL BISULFATE 75 MG PO TABS: 75.0000 mg | ORAL_TABLET | Freq: Every day | ORAL | Status: DC

## 2015-01-27 ENCOUNTER — Ambulatory Visit (INDEPENDENT_AMBULATORY_CARE_PROVIDER_SITE_OTHER): Payer: BC Managed Care – PPO | Admitting: Cardiovascular Disease

## 2015-01-27 ENCOUNTER — Ambulatory Visit (INDEPENDENT_AMBULATORY_CARE_PROVIDER_SITE_OTHER): Payer: BC Managed Care – PPO | Admitting: *Deleted

## 2015-01-27 ENCOUNTER — Encounter: Payer: Self-pay | Admitting: Cardiovascular Disease

## 2015-01-27 VITALS — BP 153/91 | HR 67 | Ht 71.0 in | Wt 205.0 lb

## 2015-01-27 DIAGNOSIS — I2510 Atherosclerotic heart disease of native coronary artery without angina pectoris: Secondary | ICD-10-CM

## 2015-01-27 DIAGNOSIS — Z5181 Encounter for therapeutic drug level monitoring: Secondary | ICD-10-CM

## 2015-01-27 DIAGNOSIS — Z79899 Other long term (current) drug therapy: Secondary | ICD-10-CM

## 2015-01-27 DIAGNOSIS — I480 Paroxysmal atrial fibrillation: Secondary | ICD-10-CM

## 2015-01-27 DIAGNOSIS — I4891 Unspecified atrial fibrillation: Secondary | ICD-10-CM

## 2015-01-27 LAB — HEPATIC FUNCTION PANEL
ALT: 21 U/L (ref 0–35)
AST: 18 U/L (ref 0–37)
Albumin: 4.8 g/dL (ref 3.5–5.2)
Alkaline Phosphatase: 90 U/L (ref 39–117)
Bilirubin, Direct: 0.1 mg/dL (ref 0.0–0.3)
Total Bilirubin: 0.7 mg/dL (ref 0.2–1.2)
Total Protein: 8.1 g/dL (ref 6.0–8.3)

## 2015-01-27 LAB — BASIC METABOLIC PANEL
BUN: 13 mg/dL (ref 6–23)
CO2: 30 mEq/L (ref 19–32)
Calcium: 10 mg/dL (ref 8.4–10.5)
Chloride: 99 mEq/L (ref 96–112)
Creatinine, Ser: 0.9 mg/dL (ref 0.40–1.20)
GFR: 81.29 mL/min (ref >60.00)
Glucose, Bld: 115 mg/dL — ABNORMAL HIGH (ref 70–99)
Potassium: 4 mEq/L (ref 3.5–5.1)
Sodium: 138 mEq/L (ref 135–145)

## 2015-01-27 LAB — LIPID PANEL
Cholesterol: 168 mg/dL (ref 0–200)
HDL: 55.6 mg/dL
LDL Cholesterol: 86 mg/dL (ref 0–99)
NonHDL: 112.4
Total CHOL/HDL Ratio: 3
Triglycerides: 132 mg/dL (ref 0.0–149.0)
VLDL: 26.4 mg/dL (ref 0.0–40.0)

## 2015-01-27 LAB — POCT INR: INR: 2.3

## 2015-01-27 LAB — TSH: TSH: 4.15 u[IU]/mL (ref 0.35–4.50)

## 2015-01-27 MED ORDER — AMIODARONE HCL 200 MG PO TABS: ORAL_TABLET | ORAL | Status: DC

## 2015-01-27 NOTE — Patient Instructions (Signed)
Your physician has recommended you make the following change in your medication:  DECREASE Amiodarone to 200 mg Monday, Wednesday, Friday (3 times per week)  Your physician recommends that you have lab work:  TODAY - TSH, LFT's, BMET, cholesterol  Your physician wants you to follow-up in: 6 months with Dr. Elease Hashimoto.  You will receive a reminder letter in the mail two months in advance. If you don't receive a letter, please call our office to schedule the follow-up appointment.

## 2015-01-27 NOTE — Progress Notes (Signed)
Cardiology Office Note   Date:  01/27/2015   ID:  [name], DOB Mar 17, 1951, MRN [id number]  PCP:  Rafael Bihari, MD  Cardiologist:   Vesta Mixer, MD   Chief Complaint  Patient presents with  . Coronary Artery Disease   1. Coronary artery disease-status post anterior wall myocardial infarction. She status post stenting of her LAD in her Jan, 2010. We placed a 3.0 x 18 mm vision stent. Was post dilated with a 3.25 mm [place] Voyager 2. Congestive heart failure with EF of 30-35% associated with an apical aneurysm. She's on Coumadin. 3. Status post AICD placement  4. Paroxysmal atrial fibrillation 5. Dyslipidemia  History of Present Illness:  [name] is a 64 year old female with a history of coronary artery disease. She status post anterior wall myocardial infarction. She has congestive heart failure with an ejection fraction of around 35%.  She's done very well since I last saw her. He is exercising on a fairly regular basis. She's not had any episodes of chest or shortness breath. She has done well. - walks every day. No chest pain or dyspnea.   She has not been as careful with her diet recently. She admits that she needs to work on a good low-fat, low carbohydrate diet.  April 15, 2013:  [name] is doing well. No CP or dyspnea. Still walks every day ( 3/4 of a mile). She is due to have an echo.   Dec. 2, 2014:  [name] is doing well. No CP or dyspnea. Still walking (3 laps around the mall Or 30 minutes) Every other day. Echocardiogram in June, 2014 sutures a persistently reduced left ventricular systolic function with an EF of around 30%. She has mild pulmonary hypertension with estimated PA pressure of 40. Left ventricle: Akinesis septum. Severe hypokinesis of the apex. The cavity size was normal. Wall thickness was increased in a pattern of mild LVH. The estimated ejection fraction was 30%. Findings consistent with left ventricular diastolic  dysfunction. - Mitral valve: Mild regurgitation. - Left atrium: The atrium was mildly dilated. - Pulmonary arteries: PA peak pressure: 71mm Hg    April 22, 2014:  [name] feels nervous today. HR and BP are up. She denies any chest pain or shortness of breath. She has had atrial fibrillation before. EKG today reveals atrial fibrillation with a ventricular rate of 136.  July 01, 2014:  [name] is doing . She's not having episodes of chest pain or shortness of breath. She's able to get out and exercise on a fairly regular basis.    January 27, 2015:   [name] is a 64 y.o. female who presents for her CAD. Doing  Well.  Exercising regulalry.   No CP with exercise.  Has lost some weight.   20 labs.  Checks her BP regularly  - its ok at home.  Watches her salt    Past Medical History  Diagnosis Date  . Coronary artery disease 12/12/2008    STATUS POST ANTERIOR WALL MYOCARDIAL INFARCTION. STATUS POST PTCA AND STENTING OF HER LAD  . CHF (congestive heart failure)     EF 30-35% BY ECHO. SHE HAS AN APICAL ANEURYSM  . Transient atrial fibrillation or flutter   . Dyslipidemia     Past Surgical History  Procedure Laterality Date  . Cardiac defibrillator placement       Current Outpatient Prescriptions  Medication Sig Dispense Refill  . amiodarone (PACERONE) 200 MG tablet Take 1 tab (200 mg)  3 days per week - Monday, Wednesday, Friday 180 tablet 2  . aspirin 81 MG tablet Take 81 mg by mouth daily.      Marland Kitchen atorvastatin (LIPITOR) 20 MG tablet Take 1 tablet (20 mg total) by mouth daily. 90 tablet 3  . carvedilol (COREG) 25 MG tablet Take 1 tablet (25 mg total) by mouth 2 (two) times daily with a meal. 60 tablet 6  . clopidogrel (PLAVIX) 75 MG tablet Take 1 tablet (75 mg total) by mouth daily. 30 tablet 6  . furosemide (LASIX) 40 MG tablet Take 1 tablet (40 mg total) by mouth daily. 30 tablet 5  . lisinopril (PRINIVIL,ZESTRIL) 10 MG tablet Take 1 tablet (10 mg total) by mouth daily. 30  tablet 6  . potassium chloride (KLOR-CON M10) 10 MEQ tablet Take 1 tablet (10 mEq total) by mouth daily. 30 tablet 6  . spironolactone (ALDACTONE) 25 MG tablet Take 1 tablet (25 mg total) by mouth daily. 30 tablet 6  . warfarin (COUMADIN) 5 MG tablet Take as directed by coumadin clinic 30 tablet 3   No current facility-administered medications for this visit.    Allergies:   Review of patient's allergies indicates no known allergies.    Social History:  The patient  reports that she quit smoking about 6 years ago. She does not have any smokeless tobacco history on file. She reports that she does not drink alcohol or use illicit drugs.   Family History:  The patient's family history includes Heart disease in her brother and sister; Hypertension in her mother.    ROS:  Please see the history of present illness.    Review of Systems: Constitutional:  denies fever, chills, diaphoresis, appetite change and fatigue.  HEENT: denies photophobia, eye pain, redness, hearing loss, ear pain, congestion, sore throat, rhinorrhea, sneezing, neck pain, neck stiffness and tinnitus.  Respiratory: denies SOB, DOE, cough, chest tightness, and wheezing.  Cardiovascular: denies chest pain, palpitations and leg swelling.  Gastrointestinal: denies nausea, vomiting, abdominal pain, diarrhea, constipation, blood in stool.  Genitourinary: denies dysuria, urgency, frequency, hematuria, flank pain and difficulty urinating.  Musculoskeletal: denies  myalgias, back pain, joint swelling, arthralgias and gait problem.   Skin: denies pallor, rash and wound.  Neurological: denies dizziness, seizures, syncope, weakness, light-headedness, numbness and headaches.   Hematological: denies adenopathy, easy bruising, personal or family bleeding history.  Psychiatric/ Behavioral: denies suicidal ideation, mood changes, confusion, nervousness, sleep disturbance and agitation.       All other systems are reviewed and  negative.    PHYSICAL EXAM: VS:  BP 153/91 mmHg  Pulse 67  Ht  (1.803 m)  Wt 205 lb (92.987 kg)  BMI 28.60 kg/m2 , BMI Body mass index is 28.6 kg/(m^2). GEN: Well nourished, well developed, in no acute distress HEENT: normal Neck: no JVD, carotid bruits, or masses Cardiac: RRR; no murmurs, rubs, or gallops,no edema  Respiratory:  clear to auscultation bilaterally, normal work of breathing GI: soft, nontender, nondistended, + BS MS: no deformity or atrophy Skin: warm and dry, no rash Neuro:  Strength and sensation are intact Psych: normal   EKG:  EKG is ordered today. The ekg ordered today demonstrates NSR at 67.  Old septal MI, no changes.    Recent Labs: 04/22/2014: Hemoglobin 10.4*; Platelets 417.0*; TSH 0.43 07/01/2014: ALT 23; BUN 16; Creatinine 1.0; Potassium 3.9; Sodium 137    Lipid Panel    Component Value Date/Time   CHOL 165 07/01/2014 1045   TRIG 170.0*  07/01/2014 1045   HDL 52.40 07/01/2014 1045   CHOLHDL 3 07/01/2014 1045   VLDL 34.0 07/01/2014 1045   LDLCALC 79 07/01/2014 1045      Wt Readings from Last 3 Encounters:  01/27/15 205 lb (92.987 kg)  09/02/14 233 lb (105.688 kg)  07/01/14 228 lb (103.42 kg)      Other studies Reviewed: Additional studies/ records that were reviewed today include: . Review of the above records demonstrates:    ASSESSMENT AND PLAN:  1. Coronary artery disease-status post anterior wall myocardial infarction. She status post stenting of her LAD in her Jan, 2010. We placed a 3.0 x 18 mm vision stent. Was post dilated with a 3.25 mm [place] Voyager - she's not having any episodes of angina pain she is walking every day without any difficulty. 2. Congestive heart failure with EF of 30-35% associated with an apical aneurysm. She's on Coumadin. 3. Status post AICD placement  4. Paroxysmal atrial fibrillation - she remains on amiodarone. We will decrease her amiodarone to 200 mg 3 days week. Check labs today including a  basic medical profile, TSH, liver enzymes, fasting lipids. 5. Dyslipidemia - check fasting labs today.   Current medicines are reviewed at length with the patient today.  The patient does not have concerns regarding medicines.  The following changes have been made:  no change  Labs/ tests ordered today include:   Orders Placed This Encounter  Procedures  . Basic Metabolic Panel (BMET)  . TSH  . Hepatic function panel  . Lipid Profile  . EKG 12-Lead     Disposition:   FU with me in 6 months     Signed, Nahser, Deloris Ping, MD  01/27/2015 1:48 PM    [facility] [address] [place], [place], [place]  [address] Phone: [phone]; Fax: [phone]

## 2015-01-28 ENCOUNTER — Telehealth: Payer: Self-pay | Admitting: Cardiovascular Disease

## 2015-01-28 NOTE — Telephone Encounter (Signed)
Reviewed lab results with patient who verbalized understanding 

## 2015-01-28 NOTE — Telephone Encounter (Signed)
New message ° ° ° ° ° ° °Pt returning nurse call  °

## 2015-02-22 ENCOUNTER — Encounter: Payer: Self-pay | Admitting: Internal Medicine

## 2015-03-01 ENCOUNTER — Ambulatory Visit (INDEPENDENT_AMBULATORY_CARE_PROVIDER_SITE_OTHER): Payer: BC Managed Care – PPO | Admitting: *Deleted

## 2015-03-01 DIAGNOSIS — I5022 Chronic systolic (congestive) heart failure: Secondary | ICD-10-CM

## 2015-03-01 DIAGNOSIS — Z5181 Encounter for therapeutic drug level monitoring: Secondary | ICD-10-CM

## 2015-03-01 DIAGNOSIS — I480 Paroxysmal atrial fibrillation: Secondary | ICD-10-CM

## 2015-03-01 DIAGNOSIS — I255 Ischemic cardiomyopathy: Secondary | ICD-10-CM | POA: Diagnosis not present

## 2015-03-01 DIAGNOSIS — I4891 Unspecified atrial fibrillation: Secondary | ICD-10-CM

## 2015-03-01 LAB — MDC_IDC_ENUM_SESS_TYPE_INCLINIC
Battery Voltage: 3.06 V
Brady Statistic RV Percent Paced: 0.1 % — CL
Lead Channel Pacing Threshold Pulse Width: 0.4 ms
Lead Channel Sensing Intrinsic Amplitude: 9.9 mV
Lead Channel Setting Pacing Amplitude: 2.5 V
Lead Channel Setting Pacing Pulse Width: 0.4 ms
Lead Channel Setting Sensing Sensitivity: 0.3 mV
MDC IDC MSMT LEADCHNL RV IMPEDANCE VALUE: 494 Ohm
MDC IDC MSMT LEADCHNL RV PACING THRESHOLD AMPLITUDE: 0.5 V
MDC IDC SET ZONE DETECTION INTERVAL: 320 ms
Zone Setting Detection Interval: 270 ms
Zone Setting Detection Interval: 400 ms

## 2015-03-01 LAB — POCT INR: INR: 2.7

## 2015-03-01 NOTE — Progress Notes (Signed)
ICD check in clinic. Normal device function. Threshold and sensing consistent with previous device measurements. Impedance trends stable over time. SIC=0. No evidence of any ventricular arrhythmias. Histogram distribution appropriate for patient and level of activity. No changes made this session. Device programmed at appropriate safety margins. Device programmed to optimize intrinsic conduction. Batt voltage 3.06V (ERI 2.63V). Plan to follow up with GT in 6 months. Patient education completed including shock plan. Alert tones demonstrated for patient.

## 2015-03-11 ENCOUNTER — Encounter: Payer: Self-pay | Admitting: Internal Medicine

## 2015-03-31 ENCOUNTER — Other Ambulatory Visit: Payer: Self-pay

## 2015-03-31 MED ORDER — ATORVASTATIN CALCIUM 20 MG PO TABS: 20.0000 mg | ORAL_TABLET | Freq: Every day | ORAL | Status: DC

## 2015-04-06 ENCOUNTER — Other Ambulatory Visit: Payer: Self-pay | Admitting: Cardiovascular Disease

## 2015-04-14 ENCOUNTER — Ambulatory Visit (INDEPENDENT_AMBULATORY_CARE_PROVIDER_SITE_OTHER): Payer: BC Managed Care – PPO | Admitting: *Deleted

## 2015-04-14 DIAGNOSIS — Z5181 Encounter for therapeutic drug level monitoring: Secondary | ICD-10-CM

## 2015-04-14 DIAGNOSIS — I480 Paroxysmal atrial fibrillation: Secondary | ICD-10-CM | POA: Diagnosis not present

## 2015-04-14 DIAGNOSIS — I4891 Unspecified atrial fibrillation: Secondary | ICD-10-CM | POA: Diagnosis not present

## 2015-04-14 LAB — POCT INR: INR: 2.7

## 2015-05-26 ENCOUNTER — Ambulatory Visit (INDEPENDENT_AMBULATORY_CARE_PROVIDER_SITE_OTHER): Payer: BC Managed Care – PPO | Admitting: *Deleted

## 2015-05-26 DIAGNOSIS — I480 Paroxysmal atrial fibrillation: Secondary | ICD-10-CM | POA: Diagnosis not present

## 2015-05-26 DIAGNOSIS — I4891 Unspecified atrial fibrillation: Secondary | ICD-10-CM

## 2015-05-26 DIAGNOSIS — Z5181 Encounter for therapeutic drug level monitoring: Secondary | ICD-10-CM | POA: Diagnosis not present

## 2015-05-26 LAB — POCT INR: INR: 2.8

## 2015-06-03 ENCOUNTER — Other Ambulatory Visit: Payer: Self-pay

## 2015-06-03 ENCOUNTER — Other Ambulatory Visit: Payer: Self-pay | Admitting: Cardiovascular Disease

## 2015-06-03 MED ORDER — FUROSEMIDE 40 MG PO TABS: 40.0000 mg | ORAL_TABLET | Freq: Every day | ORAL | Status: DC

## 2015-06-27 ENCOUNTER — Other Ambulatory Visit: Payer: Self-pay | Admitting: Cardiovascular Disease

## 2015-06-28 ENCOUNTER — Other Ambulatory Visit: Payer: Self-pay

## 2015-06-28 MED ORDER — POTASSIUM CHLORIDE CRYS ER 10 MEQ PO TBCR: 10.0000 meq | EXTENDED_RELEASE_TABLET | Freq: Every day | ORAL | Status: DC

## 2015-06-29 ENCOUNTER — Other Ambulatory Visit: Payer: Self-pay | Admitting: Cardiovascular Disease

## 2015-06-30 ENCOUNTER — Other Ambulatory Visit: Payer: Self-pay | Admitting: Internal Medicine

## 2015-06-30 DIAGNOSIS — Z1231 Encounter for screening mammogram for malignant neoplasm of breast: Secondary | ICD-10-CM

## 2015-07-02 ENCOUNTER — Other Ambulatory Visit: Payer: Self-pay | Admitting: Cardiovascular Disease

## 2015-07-07 ENCOUNTER — Ambulatory Visit (INDEPENDENT_AMBULATORY_CARE_PROVIDER_SITE_OTHER): Payer: BC Managed Care – PPO | Admitting: Pharmacist

## 2015-07-07 DIAGNOSIS — I480 Paroxysmal atrial fibrillation: Secondary | ICD-10-CM

## 2015-07-07 DIAGNOSIS — I4891 Unspecified atrial fibrillation: Secondary | ICD-10-CM

## 2015-07-07 DIAGNOSIS — Z5181 Encounter for therapeutic drug level monitoring: Secondary | ICD-10-CM | POA: Diagnosis not present

## 2015-07-07 LAB — POCT INR: INR: 2.3

## 2015-07-28 ENCOUNTER — Encounter: Payer: Self-pay | Admitting: Cardiovascular Disease

## 2015-07-28 ENCOUNTER — Ambulatory Visit (INDEPENDENT_AMBULATORY_CARE_PROVIDER_SITE_OTHER): Payer: BC Managed Care – PPO | Admitting: Cardiovascular Disease

## 2015-07-28 VITALS — BP 146/92 | HR 70 | Ht 71.0 in | Wt 226.8 lb

## 2015-07-28 DIAGNOSIS — I480 Paroxysmal atrial fibrillation: Secondary | ICD-10-CM | POA: Diagnosis not present

## 2015-07-28 DIAGNOSIS — I2510 Atherosclerotic heart disease of native coronary artery without angina pectoris: Secondary | ICD-10-CM

## 2015-07-28 DIAGNOSIS — I5022 Chronic systolic (congestive) heart failure: Secondary | ICD-10-CM | POA: Diagnosis not present

## 2015-07-28 NOTE — Patient Instructions (Signed)

## 2015-07-28 NOTE — Progress Notes (Signed)
Cardiology Office Note   Date:  07/28/2015   ID:  [name], DOB 1951-05-24, MRN [id number]  PCP:  Rafael Bihari, MD  Cardiologist:   Vesta Mixer, MD   Chief Complaint  Patient presents with  . Follow-up   1. Coronary artery disease-status post anterior wall myocardial infarction. She status post stenting of her LAD in her Jan, 2010. We placed a 3.0 x 18 mm vision stent. Was post dilated with a 3.25 mm [place] Voyager 2. Congestive heart failure with EF of 30-35% associated with an apical aneurysm. She's on Coumadin. 3. Status post AICD placement  4. Paroxysmal atrial fibrillation 5. Dyslipidemia  History of Present Illness:  [name] is a 64 year old female with a history of coronary artery disease. She status post anterior wall myocardial infarction. She has congestive heart failure with an ejection fraction of around 35%.  She's done very well since I last saw her. He is exercising on a fairly regular basis. She's not had any episodes of chest or shortness breath. She has done well. - walks every day. No chest pain or dyspnea.   She has not been as careful with her diet recently. She admits that she needs to work on a good low-fat, low carbohydrate diet.  April 15, 2013:  [name] is doing well. No CP or dyspnea. Still walks every day ( 3/4 of a mile). She is due to have an echo.   Dec. 2, 2014:  [name] is doing well. No CP or dyspnea. Still walking (3 [place] 30 minutes) Every other day. Echocardiogram in June, 2014 sutures a persistently reduced left ventricular systolic function with an EF of around 30%. She has mild pulmonary hypertension with estimated PA pressure of 40. Left ventricle: Akinesis septum. Severe hypokinesis of the apex. The cavity size was normal. Wall thickness was increased in a pattern of mild LVH. The estimated ejection fraction was 30%. Findings consistent with left ventricular diastolic dysfunction. - Mitral  valve: Mild regurgitation. - Left atrium: The atrium was mildly dilated. - Pulmonary arteries: PA peak pressure: 40mm Hg    April 22, 2014:  [name] feels nervous today. HR and BP are up. She denies any chest pain or shortness of breath. She has had atrial fibrillation before. EKG today reveals atrial fibrillation with a ventricular rate of 136.  July 01, 2014:  [name] is doing . She's not having episodes of chest pain or shortness of breath. She's able to get out and exercise on a fairly regular basis.    January 27, 2015:   [name] is a 64 y.o. female who presents for her CAD. Doing  Well.  Exercising regulalry.   No CP with exercise.  Has lost some weight.   20 labs.  Checks her BP regularly  - its ok at home.  Watches her salt  Sept. 14, 2016: Doing well.   No CP , no dyspnea.  Exercising regularly, watching her diet Her husband died of a cardiac arrest on 2023-04-05.  She is still upset    Past Medical History  Diagnosis Date  . Coronary artery disease 12/12/2008    STATUS POST ANTERIOR WALL MYOCARDIAL INFARCTION. STATUS POST PTCA AND STENTING OF HER LAD  . CHF (congestive heart failure)     EF 30-35% BY ECHO. SHE HAS AN APICAL ANEURYSM  . Transient atrial fibrillation or flutter   . Dyslipidemia     Past Surgical History  Procedure Laterality  Date  . Cardiac defibrillator placement       Current Outpatient Prescriptions  Medication Sig Dispense Refill  . amiodarone (PACERONE) 200 MG tablet Take 1 tablet (200 mg total) by mouth 3 (three) times a week. Monday, Wednesday & Friday 30 tablet 0  . aspirin 81 MG tablet Take 81 mg by mouth daily.      Marland Kitchen atorvastatin (LIPITOR) 20 MG tablet Take 1 tablet (20 mg total) by mouth daily. 90 tablet 3  . carvedilol (COREG) 25 MG tablet TAKE ONE TABLET BY MOUTH TWICE DAILY WITH A MEAL 60 tablet 1  . clopidogrel (PLAVIX) 75 MG tablet Take 1 tablet (75 mg total) by mouth daily. 30 tablet 6  . furosemide (LASIX) 40 MG tablet  Take 1 tablet (40 mg total) by mouth daily. 30 tablet 11  . lisinopril (PRINIVIL,ZESTRIL) 10 MG tablet TAKE ONE TABLET BY MOUTH ONCE DAILY 30 tablet 1  . potassium chloride (KLOR-CON M10) 10 MEQ tablet Take 1 tablet (10 mEq total) by mouth daily. 30 tablet 1  . spironolactone (ALDACTONE) 25 MG tablet TAKE ONE TABLET BY MOUTH ONCE DAILY 30 tablet 1  . warfarin (COUMADIN) 5 MG tablet Take as directed by coumadin clinic 30 tablet 3   No current facility-administered medications for this visit.    Allergies:   Review of patient's allergies indicates no known allergies.    Social History:  The patient  reports that she quit smoking about 6 years ago. She does not have any smokeless tobacco history on file. She reports that she does not drink alcohol or use illicit drugs.   Family History:  The patient's family history includes Heart disease in her brother and sister; Hypertension in her mother.    ROS:  Please see the history of present illness.    Review of Systems: Constitutional:  denies fever, chills, diaphoresis, appetite change and fatigue.  HEENT: denies photophobia, eye pain, redness, hearing loss, ear pain, congestion, sore throat, rhinorrhea, sneezing, neck pain, neck stiffness and tinnitus.  Respiratory: denies SOB, DOE, cough, chest tightness, and wheezing.  Cardiovascular: denies chest pain, palpitations and leg swelling.  Gastrointestinal: denies nausea, vomiting, abdominal pain, diarrhea, constipation, blood in stool.  Genitourinary: denies dysuria, urgency, frequency, hematuria, flank pain and difficulty urinating.  Musculoskeletal: denies  myalgias, back pain, joint swelling, arthralgias and gait problem.   Skin: denies pallor, rash and wound.  Neurological: denies dizziness, seizures, syncope, weakness, light-headedness, numbness and headaches.   Hematological: denies adenopathy, easy bruising, personal or family bleeding history.  Psychiatric/ Behavioral: denies suicidal  ideation, mood changes, confusion, nervousness, sleep disturbance and agitation.       All other systems are reviewed and negative.    PHYSICAL EXAM: VS:  BP 146/92 mmHg  Pulse 70  Ht  (1.803 m)  Wt 102.876 kg (226 lb 12.8 oz)  BMI 31.65 kg/m2  SpO2 99% , BMI Body mass index is 31.65 kg/(m^2). GEN: Well nourished, well developed, in no acute distress HEENT: normal Neck: no JVD, carotid bruits, or masses Cardiac: RRR; no murmurs, rubs, or gallops,no edema  Respiratory:  clear to auscultation bilaterally, normal work of breathing GI: soft, nontender, nondistended, + BS MS: no deformity or atrophy Skin: warm and dry, no rash Neuro:  Strength and sensation are intact Psych: normal   EKG:  EKG is ordered today. The ekg ordered today demonstrates NSR at 67.  Old septal MI, no changes.    Recent Labs: 01/27/2015: ALT 21; BUN 13; Creatinine, Ser  0.90; Potassium 4.0; Sodium 138; TSH 4.15    Lipid Panel    Component Value Date/Time   CHOL 168 01/27/2015 1116   TRIG 132.0 01/27/2015 1116   HDL 55.60 01/27/2015 1116   CHOLHDL 3 01/27/2015 1116   VLDL 26.4 01/27/2015 1116   LDLCALC 86 01/27/2015 1116      Wt Readings from Last 3 Encounters:  07/28/15 102.876 kg (226 lb 12.8 oz)  01/27/15 92.987 kg (205 lb)  09/02/14 105.688 kg (233 lb)      Other studies Reviewed: Additional studies/ records that were reviewed today include: . Review of the above records demonstrates:    ASSESSMENT AND PLAN:  1. Coronary artery disease-status post anterior wall myocardial infarction. She status post stenting of her LAD in her Jan, 2010. We placed a 3.0 x 18 mm vision stent. Was post dilated with a 3.25 mm [place] Voyager - she's not having any episodes of angina pain she is walking every day without any difficulty.  2. Congestive heart failure with EF of 30-35% associated with an apical aneurysm. She's on Coumadin. Continue coreg, lisinopril , lasix , spironlactone   3. Status post  AICD placement - followed by EP   4. Paroxysmal atrial fibrillation - she remains on amiodarone.  amiodarone to 200 mg 3 days week. Check labs today including a basic medical profile, TSH, liver enzymes, fasting lipids.  5. Dyslipidemia - check fasting labs in 6 months at her next office visit     Current medicines are reviewed at length with the patient today.  The patient does not have concerns regarding medicines.  The following changes have been made:  no change  Labs/ tests ordered today include:   No orders of the defined types were placed in this encounter.     Disposition:   FU with me in 6 months  , fasting labs    Signed, Alaa Eyerman, Deloris Ping, MD  07/28/2015 11:03 AM    [facility] [address] [place], [place], [place]  [address] Phone: [phone]; Fax: [phone]

## 2015-08-01 ENCOUNTER — Other Ambulatory Visit: Payer: Self-pay | Admitting: Cardiovascular Disease

## 2015-08-17 ENCOUNTER — Other Ambulatory Visit: Payer: Self-pay | Admitting: Cardiovascular Disease

## 2015-08-18 ENCOUNTER — Ambulatory Visit (INDEPENDENT_AMBULATORY_CARE_PROVIDER_SITE_OTHER): Payer: BC Managed Care – PPO | Admitting: *Deleted

## 2015-08-18 DIAGNOSIS — I480 Paroxysmal atrial fibrillation: Secondary | ICD-10-CM

## 2015-08-18 DIAGNOSIS — I4891 Unspecified atrial fibrillation: Secondary | ICD-10-CM

## 2015-08-18 DIAGNOSIS — Z5181 Encounter for therapeutic drug level monitoring: Secondary | ICD-10-CM

## 2015-08-18 LAB — POCT INR: INR: 2.7

## 2015-08-31 ENCOUNTER — Other Ambulatory Visit: Payer: Self-pay | Admitting: Cardiovascular Disease

## 2015-09-09 ENCOUNTER — Ambulatory Visit (INDEPENDENT_AMBULATORY_CARE_PROVIDER_SITE_OTHER): Payer: BC Managed Care – PPO | Admitting: Internal Medicine

## 2015-09-09 ENCOUNTER — Encounter: Payer: Self-pay | Admitting: Internal Medicine

## 2015-09-09 VITALS — BP 130/76 | HR 80 | Ht 71.0 in | Wt 232.2 lb

## 2015-09-09 DIAGNOSIS — Z95810 Presence of automatic (implantable) cardiac defibrillator: Secondary | ICD-10-CM

## 2015-09-09 DIAGNOSIS — I5022 Chronic systolic (congestive) heart failure: Secondary | ICD-10-CM | POA: Diagnosis not present

## 2015-09-09 DIAGNOSIS — I480 Paroxysmal atrial fibrillation: Secondary | ICD-10-CM

## 2015-09-09 LAB — CUP PACEART INCLINIC DEVICE CHECK
Implantable Lead Model: 6947
Lead Channel Pacing Threshold Amplitude: 0.75 V
Lead Channel Pacing Threshold Pulse Width: 0.4 ms
Lead Channel Sensing Intrinsic Amplitude: 9.5 mV
Lead Channel Setting Pacing Amplitude: 2.5 V
Lead Channel Setting Pacing Pulse Width: 0.4 ms
MDC IDC LEAD IMPLANT DT: 20110627
MDC IDC LEAD LOCATION: 753860
MDC IDC SESS DTM: 20161027125430
MDC IDC SET LEADCHNL RV SENSING SENSITIVITY: 0.3 mV

## 2015-09-09 NOTE — Assessment & Plan Note (Signed)
I have asked her to reduce her fat intake and to lose weight.

## 2015-09-09 NOTE — Patient Instructions (Addendum)
Medication Instructions:  Your physician recommends that you continue on your current medications as directed. Please refer to the Current Medication list given to you today.  Labwork: None ordered  Testing/Procedures: None ordered  Follow-Up: Your physician recommends that you schedule a follow-up appointment in: 6 months with device clinic  Your physician wants you to follow-up in: 1 year with Dr. Ladona Ridgel. You will receive a reminder letter in the mail two months in advance. If you don't receive a letter, please call our office to schedule the follow-up appointment.  Any Other Special Instructions Will Be Listed Below (If Applicable).  If you need a refill on your cardiac medications before your next appointment, please call your pharmacy.  Thank you for choosing [facility]!!

## 2015-09-09 NOTE — Progress Notes (Signed)
HPI [name] returns today for followup. She is a 64 year old woman with a history of chronic systolic heart failure, an ischemic cardiomyopathy, paroxysmal atrial fibrillation, status post ICD implantation. In the interim, she has done well except she lost her husband who died suddenly 5 months ago. She denies chest pain, shortness of breath, or syncope. She does admit to some dietary indiscretion, and has not lost any weight.  No Known Allergies   Current Outpatient Prescriptions  Medication Sig Dispense Refill  . amiodarone (PACERONE) 200 MG tablet TAKE ONE TABLET BY MOUTH 3 TIMES WEEKLY (MONDAY,WEDNESDAY, AND FRIDAY) 30 tablet 0  . aspirin 81 MG tablet Take 81 mg by mouth daily.      Marland Kitchen atorvastatin (LIPITOR) 20 MG tablet Take 1 tablet (20 mg total) by mouth daily. 90 tablet 3  . carvedilol (COREG) 25 MG tablet TAKE ONE TABLET BY MOUTH TWICE DAILY WITH MEALS 60 tablet 3  . clopidogrel (PLAVIX) 75 MG tablet TAKE ONE TABLET BY MOUTH ONCE DAILY 30 tablet 11  . furosemide (LASIX) 40 MG tablet Take 1 tablet (40 mg total) by mouth daily. 30 tablet 11  . KLOR-CON M10 10 MEQ tablet TAKE ONE TABLET BY MOUTH ONCE DAILY 30 tablet 11  . lisinopril (PRINIVIL,ZESTRIL) 10 MG tablet TAKE ONE TABLET BY MOUTH ONCE DAILY 30 tablet 3  . spironolactone (ALDACTONE) 25 MG tablet TAKE ONE TABLET BY MOUTH ONCE DAILY 30 tablet 3  . warfarin (COUMADIN) 5 MG tablet Take as directed by coumadin clinic 30 tablet 3   No current facility-administered medications for this visit.     Past Medical History  Diagnosis Date  . Coronary artery disease 12/12/2008    STATUS POST ANTERIOR WALL MYOCARDIAL INFARCTION. STATUS POST PTCA AND STENTING OF HER LAD  . CHF (congestive heart failure) (HCC)     EF 30-35% BY ECHO. SHE HAS AN APICAL ANEURYSM  . Transient atrial fibrillation or flutter   . Dyslipidemia     ROS:   All systems reviewed and negative except as noted in the HPI.   Past Surgical History    Procedure Laterality Date  . Cardiac defibrillator placement       Family History  Problem Relation Age of Onset  . Hypertension Mother   . Heart disease Sister   . Heart disease Brother      Social History   Social History  . Marital Status: Married    Spouse Name: N/A  . Number of Children: N/A  . Years of Education: N/A   Occupational History  . Not on file.   Social History Main Topics  . Smoking status: Former Smoker    Quit date: 12/14/2008  . Smokeless tobacco: Not on file  . Alcohol Use: No  . Drug Use: No  . Sexual Activity: Not on file   Other Topics Concern  . Not on file   Social History Narrative     BP 130/76 mmHg  Pulse 80  Ht 5\' 11"  (1.803 m)  Wt 232 lb 3.2 oz (105.325 kg)  BMI 32.40 kg/m2  Physical Exam:  Well appearing middle-age woman, obese, NAD HEENT: Unremarkable Neck:  No JVD, no thyromegally Back:  No CVA tenderness Lungs:  Clear with no wheezes, rales, or rhonchi. HEART:  Regular rate rhythm, no murmurs, no rubs, no clicks Abd:  soft, obese, positive bowel sounds, no organomegally, no rebound, no guarding Ext:  2 plus pulses, no edema, no cyanosis, no clubbing Skin:  No rashes no nodules Neuro:  CN II through XII intact, motor grossly intact   DEVICE  Normal device function.  See PaceArt for details.   Assess/Plan:

## 2015-09-09 NOTE — Assessment & Plan Note (Signed)
Her medtronic device is working normally. Will recheck in several months.  

## 2015-09-09 NOTE — Assessment & Plan Note (Signed)
Her symptoms are class 2A. She will continue her current meds. I have asked her to lose weight.

## 2015-09-09 NOTE — Assessment & Plan Note (Signed)
She has maintained NSR very nicely. No change in meds.

## 2015-09-29 ENCOUNTER — Ambulatory Visit (INDEPENDENT_AMBULATORY_CARE_PROVIDER_SITE_OTHER): Payer: BC Managed Care – PPO | Admitting: *Deleted

## 2015-09-29 DIAGNOSIS — I4891 Unspecified atrial fibrillation: Secondary | ICD-10-CM | POA: Diagnosis not present

## 2015-09-29 DIAGNOSIS — Z5181 Encounter for therapeutic drug level monitoring: Secondary | ICD-10-CM | POA: Diagnosis not present

## 2015-09-29 DIAGNOSIS — I480 Paroxysmal atrial fibrillation: Secondary | ICD-10-CM

## 2015-09-29 LAB — POCT INR: INR: 2.2

## 2015-10-28 ENCOUNTER — Ambulatory Visit
Admission: RE | Admit: 2015-10-28 | Discharge: 2015-10-28 | Disposition: A | Discharge status: Home / Self Care | Payer: BC Managed Care – PPO | Source: Ambulatory Visit | Attending: Internal Medicine | Admitting: Internal Medicine

## 2015-10-28 DIAGNOSIS — Z1231 Encounter for screening mammogram for malignant neoplasm of breast: Secondary | ICD-10-CM | POA: Diagnosis present

## 2015-11-10 ENCOUNTER — Ambulatory Visit (INDEPENDENT_AMBULATORY_CARE_PROVIDER_SITE_OTHER): Payer: BC Managed Care – PPO | Admitting: *Deleted

## 2015-11-10 DIAGNOSIS — I480 Paroxysmal atrial fibrillation: Secondary | ICD-10-CM

## 2015-11-10 DIAGNOSIS — Z5181 Encounter for therapeutic drug level monitoring: Secondary | ICD-10-CM

## 2015-11-10 DIAGNOSIS — I4891 Unspecified atrial fibrillation: Secondary | ICD-10-CM

## 2015-11-10 LAB — POCT INR: INR: 2.3

## 2015-11-30 ENCOUNTER — Other Ambulatory Visit: Payer: Self-pay | Admitting: Cardiovascular Disease

## 2015-12-22 ENCOUNTER — Ambulatory Visit (INDEPENDENT_AMBULATORY_CARE_PROVIDER_SITE_OTHER): Payer: BC Managed Care – PPO | Admitting: Surgery

## 2015-12-22 DIAGNOSIS — Z5181 Encounter for therapeutic drug level monitoring: Secondary | ICD-10-CM

## 2015-12-22 DIAGNOSIS — I4891 Unspecified atrial fibrillation: Secondary | ICD-10-CM

## 2015-12-22 DIAGNOSIS — I480 Paroxysmal atrial fibrillation: Secondary | ICD-10-CM | POA: Diagnosis not present

## 2015-12-22 LAB — POCT INR: INR: 2.6

## 2015-12-29 ENCOUNTER — Other Ambulatory Visit: Payer: Self-pay | Admitting: Cardiovascular Disease

## 2015-12-29 ENCOUNTER — Other Ambulatory Visit: Payer: Self-pay | Admitting: *Deleted

## 2016-02-02 ENCOUNTER — Ambulatory Visit (INDEPENDENT_AMBULATORY_CARE_PROVIDER_SITE_OTHER): Payer: BC Managed Care – PPO | Admitting: Cardiovascular Disease

## 2016-02-02 ENCOUNTER — Other Ambulatory Visit (INDEPENDENT_AMBULATORY_CARE_PROVIDER_SITE_OTHER): Payer: BC Managed Care – PPO | Admitting: *Deleted

## 2016-02-02 ENCOUNTER — Ambulatory Visit (INDEPENDENT_AMBULATORY_CARE_PROVIDER_SITE_OTHER): Payer: BC Managed Care – PPO | Admitting: *Deleted

## 2016-02-02 ENCOUNTER — Encounter: Payer: Self-pay | Admitting: Cardiovascular Disease

## 2016-02-02 VITALS — BP 138/80 | HR 84 | Ht 71.0 in | Wt 232.8 lb

## 2016-02-02 DIAGNOSIS — I4891 Unspecified atrial fibrillation: Secondary | ICD-10-CM | POA: Diagnosis not present

## 2016-02-02 DIAGNOSIS — I255 Ischemic cardiomyopathy: Secondary | ICD-10-CM | POA: Diagnosis not present

## 2016-02-02 DIAGNOSIS — I5022 Chronic systolic (congestive) heart failure: Secondary | ICD-10-CM | POA: Diagnosis not present

## 2016-02-02 DIAGNOSIS — I2510 Atherosclerotic heart disease of native coronary artery without angina pectoris: Secondary | ICD-10-CM

## 2016-02-02 DIAGNOSIS — I480 Paroxysmal atrial fibrillation: Secondary | ICD-10-CM

## 2016-02-02 DIAGNOSIS — I4819 Other persistent atrial fibrillation: Secondary | ICD-10-CM

## 2016-02-02 DIAGNOSIS — I481 Persistent atrial fibrillation: Secondary | ICD-10-CM

## 2016-02-02 DIAGNOSIS — Z5181 Encounter for therapeutic drug level monitoring: Secondary | ICD-10-CM

## 2016-02-02 LAB — HEPATIC FUNCTION PANEL
ALBUMIN: 4.7 g/dL (ref 3.6–5.1)
ALT: 20 U/L (ref 6–29)
AST: 16 U/L (ref 10–35)
Alkaline Phosphatase: 81 U/L (ref 33–130)
BILIRUBIN TOTAL: 0.7 mg/dL (ref 0.2–1.2)
Bilirubin, Direct: 0.1 mg/dL (ref <=0.2)
Indirect Bilirubin: 0.6 mg/dL (ref 0.2–1.2)
Total Protein: 7.8 g/dL (ref 6.1–8.1)

## 2016-02-02 LAB — BASIC METABOLIC PANEL
BUN: 21 mg/dL (ref 7–25)
CHLORIDE: 101 mmol/L (ref 98–110)
CO2: 28 mmol/L (ref 20–31)
Calcium: 9.8 mg/dL (ref 8.6–10.4)
Creat: 1.01 mg/dL — ABNORMAL HIGH (ref 0.50–0.99)
GLUCOSE: 111 mg/dL — AB (ref 65–99)
POTASSIUM: 4.3 mmol/L (ref 3.5–5.3)
SODIUM: 140 mmol/L (ref 135–146)

## 2016-02-02 LAB — LIPID PANEL
CHOL/HDL RATIO: 2.8 ratio (ref <=5.0)
CHOLESTEROL: 182 mg/dL (ref 125–200)
HDL: 65 mg/dL (ref >=46)
LDL Cholesterol: 92 mg/dL (ref <130)
Triglycerides: 127 mg/dL (ref <150)
VLDL: 25 mg/dL (ref <30)

## 2016-02-02 LAB — POCT INR: INR: 1.9

## 2016-02-02 MED ORDER — AMIODARONE HCL 200 MG PO TABS: ORAL_TABLET | ORAL | Status: DC

## 2016-02-02 NOTE — Progress Notes (Signed)
Cardiology Office Note   Date:  02/02/2016   ID:  JERA HEADINGS, DOB 12/20/1950, MRN [id number]  PCP:  Rafael Bihari, MD  Cardiologist:   Vesta Mixer, MD   Chief Complaint  Patient presents with  . Atrial Fibrillation  . Cardiomyopathy   1. Coronary artery disease-status post anterior wall myocardial infarction. She status post stenting of her LAD in her Jan, 2010. We placed a 3.0 x 18 mm vision stent. Was post dilated with a 3.25 mm [place] Voyager 2. Congestive heart failure with EF of 30-35% associated with an apical aneurysm. She's on Coumadin. 3. Status post AICD placement  4. Paroxysmal atrial fibrillation 5. Dyslipidemia  History of Present Illness:  [name] is a 65 year old female with a history of coronary artery disease. She status post anterior wall myocardial infarction. She has congestive heart failure with an ejection fraction of around 35%.  She's done very well since I last saw her. He is exercising on a fairly regular basis. She's not had any episodes of chest or shortness breath. She has done well. - walks every day. No chest pain or dyspnea.   She has not been as careful with her diet recently. She admits that she needs to work on a good low-fat, low carbohydrate diet.  April 15, 2013:  [name] is doing well. No CP or dyspnea. Still walks every day ( 3/4 of a mile). She is due to have an echo.   Dec. 2, 2014:  [name] is doing well. No CP or dyspnea. Still walking (3 laps [place] minutes) Every other day. Echocardiogram in June, 2014 sutures a persistently reduced left ventricular systolic function with an EF of around 30%. She has mild pulmonary hypertension with estimated PA pressure of 40. Left ventricle: Akinesis septum. Severe hypokinesis of the apex. The cavity size was normal. Wall thickness was increased in a pattern of mild LVH. The estimated ejection fraction was 30%. Findings consistent with left ventricular  diastolic dysfunction. - Mitral valve: Mild regurgitation. - Left atrium: The atrium was mildly dilated. - Pulmonary arteries: PA peak pressure: 40mm Hg    April 22, 2014:  Neriyah feels nervous today. HR and BP are up. She denies any chest pain or shortness of breath. She has had atrial fibrillation before. EKG today reveals atrial fibrillation with a ventricular rate of 136.  July 01, 2014:  [name] is doing . She's not having episodes of chest pain or shortness of breath. She's able to get out and exercise on a fairly regular basis.    January 27, 2015:   [name] is a 65 y.o. female who presents for her CAD. Doing  Well.  Exercising regulalry.   No CP with exercise.  Has lost some weight.   20 labs.  Checks her BP regularly  - its ok at home.  Watches her salt  Sept. 14, 2016: Doing well.   No CP , no dyspnea.  Exercising regularly, watching her diet Her husband died of a cardiac arrest on 2023-04-18.  She is still upset   February 02, 2016:  Walking every day  No CP .  Has not been able to lose weight .    Past Medical History  Diagnosis Date  . Coronary artery disease 12/12/2008    STATUS POST ANTERIOR WALL MYOCARDIAL INFARCTION. STATUS POST PTCA AND STENTING OF HER LAD  . CHF (congestive heart failure) (HCC)     EF 30-35% BY  ECHO. SHE HAS AN APICAL ANEURYSM  . Transient atrial fibrillation or flutter   . Dyslipidemia     Past Surgical History  Procedure Laterality Date  . Cardiac defibrillator placement       Current Outpatient Prescriptions  Medication Sig Dispense Refill  . amiodarone (PACERONE) 200 MG tablet TAKE ONE TABLET BY MOUTH THREE TIMES WEEKLY. TAKE ON MONDAY, WEDNESDAY, AND FRIDAY 30 tablet 3  . aspirin 81 MG tablet Take 81 mg by mouth daily.      Marland Kitchen atorvastatin (LIPITOR) 20 MG tablet Take 1 tablet (20 mg total) by mouth daily. 90 tablet 3  . carvedilol (COREG) 25 MG tablet TAKE ONE TABLET BY MOUTH TWICE DAILY WITH MEALS 60 tablet 1  .  clopidogrel (PLAVIX) 75 MG tablet TAKE ONE TABLET BY MOUTH ONCE DAILY 30 tablet 11  . furosemide (LASIX) 40 MG tablet Take 1 tablet (40 mg total) by mouth daily. 30 tablet 11  . KLOR-CON M10 10 MEQ tablet TAKE ONE TABLET BY MOUTH ONCE DAILY 30 tablet 11  . lisinopril (PRINIVIL,ZESTRIL) 10 MG tablet TAKE ONE TABLET BY MOUTH ONCE DAILY 30 tablet 1  . spironolactone (ALDACTONE) 25 MG tablet TAKE ONE TABLET BY MOUTH ONCE DAILY 30 tablet 1  . warfarin (COUMADIN) 5 MG tablet USE AS DIRECTED BY  COUMADIN  CLINIC 30 tablet 3   No current facility-administered medications for this visit.    Allergies:   Review of patient's allergies indicates no known allergies.    Social History:  The patient  reports that she quit smoking about 7 years ago. She does not have any smokeless tobacco history on file. She reports that she does not drink alcohol or use illicit drugs.   Family History:  The patient's family history includes Heart disease in her brother and sister; Hypertension in her mother. There is no history of Breast cancer.    ROS:  Please see the history of present illness.    Review of Systems: Constitutional:  denies fever, chills, diaphoresis, appetite change and fatigue.  HEENT: denies photophobia, eye pain, redness, hearing loss, ear pain, congestion, sore throat, rhinorrhea, sneezing, neck pain, neck stiffness and tinnitus.  Respiratory: denies SOB, DOE, cough, chest tightness, and wheezing.  Cardiovascular: denies chest pain, palpitations and leg swelling.  Gastrointestinal: denies nausea, vomiting, abdominal pain, diarrhea, constipation, blood in stool.  Genitourinary: denies dysuria, urgency, frequency, hematuria, flank pain and difficulty urinating.  Musculoskeletal: denies  myalgias, back pain, joint swelling, arthralgias and gait problem.   Skin: denies pallor, rash and wound.  Neurological: denies dizziness, seizures, syncope, weakness, light-headedness, numbness and headaches.     Hematological: denies adenopathy, easy bruising, personal or family bleeding history.  Psychiatric/ Behavioral: denies suicidal ideation, mood changes, confusion, nervousness, sleep disturbance and agitation.       All other systems are reviewed and negative.    PHYSICAL EXAM: VS:  BP 138/80 mmHg  Pulse 84  Ht 5\' 11"  (1.803 m)  Wt 232 lb 12.8 oz (105.597 kg)  BMI 32.48 kg/m2 , BMI Body mass index is 32.48 kg/(m^2). GEN: Well nourished, well developed, in no acute distress HEENT: normal Neck: no JVD, carotid bruits, or masses Cardiac: RRR; no murmurs, rubs, or gallops,no edema  Respiratory:  clear to auscultation bilaterally, normal work of breathing GI: soft, nontender, nondistended, + BS MS: no deformity or atrophy Skin: warm and dry, no rash Neuro:  Strength and sensation are intact Psych: normal   EKG:  EKG is ordered today. The ekg  ordered today demonstrates NSR at 84.  Old septal MI, no changes.    Recent Labs: No results found for requested labs within last 365 days.    Lipid Panel    Component Value Date/Time   CHOL 168 01/27/2015 1116   TRIG 132.0 01/27/2015 1116   HDL 55.60 01/27/2015 1116   CHOLHDL 3 01/27/2015 1116   VLDL 26.4 01/27/2015 1116   LDLCALC 86 01/27/2015 1116      Wt Readings from Last 3 Encounters:  02/02/16 232 lb 12.8 oz (105.597 kg)  09/09/15 232 lb 3.2 oz (105.325 kg)  07/28/15 226 lb 12.8 oz (102.876 kg)      Other studies Reviewed: Additional studies/ records that were reviewed today include: . Review of the above records demonstrates:    ASSESSMENT AND PLAN:  1. Coronary artery disease-status post anterior wall myocardial infarction. She status post stenting of her LAD in her Jan, 2010. We placed a 3.0 x 18 mm vision stent. Was post dilated with a 3.25 mm [place] Voyager - she's not having any episodes of angina pain .   She needs to walk more..   She has gotten out of the habit of walking recently   2. Congestive heart  failure with EF of 30-35% associated with an apical aneurysm. She's on Coumadin. Continue coreg, lisinopril , lasix , spironlactone   3. Status post AICD placement - followed by EP   4. Paroxysmal atrial fibrillation - she remains on amiodarone.  Will lower amio to 200 mg twice a week  Check labs including TSH i 6 months at next office visit   5. Dyslipidemia - check fasting labs today Her LDL is still elevated - 92. Will increae Atrovastatin to 40 a day  Check lipids in 3 months     Current medicines are reviewed at length with the patient today.  The patient does not have concerns regarding medicines.  The following changes have been made:  no change  Labs/ tests ordered today include:   No orders of the defined types were placed in this encounter.     Disposition:   FU with me in 6 months  , fasting labs    Signed, Gertude Benito, Deloris Ping, MD  02/02/2016 10:42 AM    [facility] [address] [place], [place], [place]  [address] Phone: [phone]; Fax: [phone]

## 2016-02-02 NOTE — Patient Instructions (Signed)
Medication Instructions:  DECREASE Amiodarone to 200 mg twice per week (Mondays and Thursdays)   Labwork: Your physician recommends that you return for lab work in: 6 months on the day of or a few days before your office visit with Dr. Elease Hashimoto.  You will need to FAST for this appointment - nothing to eat or drink after midnight the night before except water.   Testing/Procedures: None Ordered   Follow-Up: Your physician wants you to follow-up in: 6 months with Dr. Elease Hashimoto.  You will receive a reminder letter in the mail two months in advance. If you don't receive a letter, please call our office to schedule the follow-up appointment.   If you need a refill on your cardiac medications before your next appointment, please call your pharmacy.   Thank you for choosing [facility]! Eligha Bridegroom, RN [phone]

## 2016-02-03 ENCOUNTER — Telehealth: Payer: Self-pay | Admitting: Nurse Practitioner

## 2016-02-03 DIAGNOSIS — E785 Hyperlipidemia, unspecified: Secondary | ICD-10-CM

## 2016-02-03 MED ORDER — ATORVASTATIN CALCIUM 40 MG PO TABS: 40.0000 mg | ORAL_TABLET | Freq: Every day | ORAL | Status: DC

## 2016-02-03 NOTE — Telephone Encounter (Signed)
Reviewed results and plan of care with patient who verbalized understanding and agreement.  She is scheduled for repeat lab work on 6/28.  I have sent new Rx for Atorvastatin 40 mg to patient's pharmacy.  She thanked me for the call.

## 2016-02-03 NOTE — Telephone Encounter (Signed)
-----   Message from Vesta Mixer, MD sent at 02/02/2016  5:01 PM EDT ----- She has CAD and so her LDL goal is 70.  Currently LDL is 92. She should increase her atorvastatin to 40 Check fasting lipids and cmet in 3 months

## 2016-02-29 ENCOUNTER — Other Ambulatory Visit: Payer: Self-pay | Admitting: Cardiovascular Disease

## 2016-03-01 NOTE — Telephone Encounter (Signed)
Rx request sent to pharmacy.  

## 2016-03-09 ENCOUNTER — Ambulatory Visit (INDEPENDENT_AMBULATORY_CARE_PROVIDER_SITE_OTHER): Payer: BC Managed Care – PPO | Admitting: *Deleted

## 2016-03-09 ENCOUNTER — Encounter: Payer: Self-pay | Admitting: Internal Medicine

## 2016-03-09 DIAGNOSIS — I4891 Unspecified atrial fibrillation: Secondary | ICD-10-CM | POA: Diagnosis not present

## 2016-03-09 DIAGNOSIS — I480 Paroxysmal atrial fibrillation: Secondary | ICD-10-CM

## 2016-03-09 DIAGNOSIS — Z5181 Encounter for therapeutic drug level monitoring: Secondary | ICD-10-CM

## 2016-03-09 DIAGNOSIS — Z4502 Encounter for adjustment and management of automatic implantable cardiac defibrillator: Secondary | ICD-10-CM | POA: Diagnosis not present

## 2016-03-09 LAB — CUP PACEART INCLINIC DEVICE CHECK
Battery Voltage: 3.02 V
HighPow Impedance: 53 Ohm
HighPow Impedance: 70 Ohm
Implantable Lead Implant Date: 20110627
Implantable Lead Location: 753860
Lead Channel Pacing Threshold Pulse Width: 0.4 ms
MDC IDC MSMT LEADCHNL RV IMPEDANCE VALUE: 551 Ohm
MDC IDC MSMT LEADCHNL RV PACING THRESHOLD AMPLITUDE: 1.25 V
MDC IDC MSMT LEADCHNL RV SENSING INTR AMPL: 11.75 mV
MDC IDC SESS DTM: 20170427100639
MDC IDC SET LEADCHNL RV PACING AMPLITUDE: 2.5 V
MDC IDC SET LEADCHNL RV PACING PULSEWIDTH: 0.4 ms
MDC IDC SET LEADCHNL RV SENSING SENSITIVITY: 0.3 mV
MDC IDC STAT BRADY RV PERCENT PACED: 0 %

## 2016-03-09 LAB — POCT INR: INR: 2.6

## 2016-03-09 NOTE — Progress Notes (Signed)
ICD check in clinic. Normal device function. Thresholds and sensing consistent with previous device measurements. Impedance trends stable over time. No evidence of any ventricular arrhythmias. No mode switches. Histogram distribution appropriate for patient and level of activity. No changes made this session. Device programmed at appropriate safety margins. Device programmed to optimize intrinsic conduction. Battery voltage at 3.02V. ROV with GT in October.

## 2016-04-19 ENCOUNTER — Ambulatory Visit (INDEPENDENT_AMBULATORY_CARE_PROVIDER_SITE_OTHER): Payer: BC Managed Care – PPO | Admitting: *Deleted

## 2016-04-19 DIAGNOSIS — I4891 Unspecified atrial fibrillation: Secondary | ICD-10-CM | POA: Diagnosis not present

## 2016-04-19 DIAGNOSIS — I480 Paroxysmal atrial fibrillation: Secondary | ICD-10-CM | POA: Diagnosis not present

## 2016-04-19 DIAGNOSIS — Z5181 Encounter for therapeutic drug level monitoring: Secondary | ICD-10-CM

## 2016-04-19 LAB — POCT INR: INR: 2.3

## 2016-04-26 ENCOUNTER — Other Ambulatory Visit: Payer: Self-pay | Admitting: Cardiovascular Disease

## 2016-05-10 ENCOUNTER — Other Ambulatory Visit (INDEPENDENT_AMBULATORY_CARE_PROVIDER_SITE_OTHER): Payer: BC Managed Care – PPO | Admitting: *Deleted

## 2016-05-10 DIAGNOSIS — E785 Hyperlipidemia, unspecified: Secondary | ICD-10-CM

## 2016-05-10 LAB — COMPREHENSIVE METABOLIC PANEL
ALT: 21 U/L (ref 6–29)
AST: 16 U/L (ref 10–35)
Albumin: 4.2 g/dL (ref 3.6–5.1)
Alkaline Phosphatase: 81 U/L (ref 33–130)
BUN: 20 mg/dL (ref 7–25)
CALCIUM: 9.3 mg/dL (ref 8.6–10.4)
CO2: 26 mmol/L (ref 20–31)
Chloride: 103 mmol/L (ref 98–110)
Creat: 0.93 mg/dL (ref 0.50–0.99)
GLUCOSE: 125 mg/dL — AB (ref 65–99)
POTASSIUM: 4.3 mmol/L (ref 3.5–5.3)
Sodium: 140 mmol/L (ref 135–146)
Total Bilirubin: 0.5 mg/dL (ref 0.2–1.2)
Total Protein: 6.9 g/dL (ref 6.1–8.1)

## 2016-05-10 LAB — LIPID PANEL
CHOL/HDL RATIO: 2.4 ratio (ref <=5.0)
Cholesterol: 134 mg/dL (ref 125–200)
HDL: 55 mg/dL (ref >=46)
LDL CALC: 61 mg/dL (ref <130)
TRIGLYCERIDES: 92 mg/dL (ref <150)
VLDL: 18 mg/dL (ref <30)

## 2016-05-10 NOTE — Addendum Note (Signed)
Addended by: Tonita Phoenix on: 05/10/2016 07:43 AM   Modules accepted: Orders

## 2016-05-15 ENCOUNTER — Telehealth: Payer: Self-pay | Admitting: Cardiovascular Disease

## 2016-05-15 NOTE — Telephone Encounter (Signed)
Fu  Pt returning RN phone call- lab results. Please call back and discuss.   

## 2016-05-15 NOTE — Telephone Encounter (Signed)
-----   Message from Vesta Mixer, MD sent at 05/11/2016  1:06 PM EDT ----- Glucose is elevated.  Otherwise the CMP is normal lipids are good

## 2016-05-15 NOTE — Telephone Encounter (Signed)
Informed patient of results and verbal understanding expressed.  

## 2016-05-27 ENCOUNTER — Other Ambulatory Visit: Payer: Self-pay | Admitting: Cardiovascular Disease

## 2016-05-31 ENCOUNTER — Ambulatory Visit (INDEPENDENT_AMBULATORY_CARE_PROVIDER_SITE_OTHER): Payer: BC Managed Care – PPO | Admitting: *Deleted

## 2016-05-31 DIAGNOSIS — Z5181 Encounter for therapeutic drug level monitoring: Secondary | ICD-10-CM | POA: Diagnosis not present

## 2016-05-31 DIAGNOSIS — I480 Paroxysmal atrial fibrillation: Secondary | ICD-10-CM | POA: Diagnosis not present

## 2016-05-31 DIAGNOSIS — I4891 Unspecified atrial fibrillation: Secondary | ICD-10-CM

## 2016-05-31 LAB — POCT INR: INR: 2.7

## 2016-07-06 ENCOUNTER — Other Ambulatory Visit: Payer: Self-pay | Admitting: Internal Medicine

## 2016-07-06 DIAGNOSIS — Z1231 Encounter for screening mammogram for malignant neoplasm of breast: Secondary | ICD-10-CM

## 2016-07-12 ENCOUNTER — Encounter (INDEPENDENT_AMBULATORY_CARE_PROVIDER_SITE_OTHER): Payer: Self-pay

## 2016-07-12 ENCOUNTER — Ambulatory Visit (INDEPENDENT_AMBULATORY_CARE_PROVIDER_SITE_OTHER): Payer: BC Managed Care – PPO | Admitting: *Deleted

## 2016-07-12 DIAGNOSIS — I4891 Unspecified atrial fibrillation: Secondary | ICD-10-CM | POA: Diagnosis not present

## 2016-07-12 DIAGNOSIS — Z5181 Encounter for therapeutic drug level monitoring: Secondary | ICD-10-CM | POA: Diagnosis not present

## 2016-07-12 LAB — POCT INR: INR: 2.7

## 2016-07-18 ENCOUNTER — Encounter: Payer: Self-pay | Admitting: Cardiovascular Disease

## 2016-07-27 ENCOUNTER — Other Ambulatory Visit: Payer: Self-pay | Admitting: Cardiovascular Disease

## 2016-08-02 ENCOUNTER — Encounter (INDEPENDENT_AMBULATORY_CARE_PROVIDER_SITE_OTHER): Payer: Self-pay

## 2016-08-02 ENCOUNTER — Ambulatory Visit (INDEPENDENT_AMBULATORY_CARE_PROVIDER_SITE_OTHER): Payer: BC Managed Care – PPO | Admitting: Cardiovascular Disease

## 2016-08-02 ENCOUNTER — Encounter: Payer: Self-pay | Admitting: Cardiovascular Disease

## 2016-08-02 VITALS — BP 126/80 | HR 70 | Ht 71.0 in | Wt 232.8 lb

## 2016-08-02 DIAGNOSIS — I2510 Atherosclerotic heart disease of native coronary artery without angina pectoris: Secondary | ICD-10-CM

## 2016-08-02 DIAGNOSIS — I480 Paroxysmal atrial fibrillation: Secondary | ICD-10-CM | POA: Diagnosis not present

## 2016-08-02 DIAGNOSIS — I5022 Chronic systolic (congestive) heart failure: Secondary | ICD-10-CM

## 2016-08-02 DIAGNOSIS — I2583 Coronary atherosclerosis due to lipid rich plaque: Secondary | ICD-10-CM

## 2016-08-02 NOTE — Progress Notes (Signed)
Cardiology Office Note   Date:  08/02/2016   ID:  ELEAH LAHAIE, DOB September 19, 1951, MRN [id number]  PCP:  Rafael Bihari, MD  Cardiologist:   Kristeen Miss, MD   No chief complaint on file.  1. Coronary artery disease-status post anterior wall myocardial infarction. She status post stenting of her LAD in her Jan, 2010. We placed a 3.0 x 18 mm vision stent. Was post dilated with a 3.25 mm [place] Voyager 2. Congestive heart failure with EF of 30-35% associated with an apical aneurysm. She's on Coumadin. 3. Status post AICD placement  4. Paroxysmal atrial fibrillation 5. Dyslipidemia     Samyuktha is a 65 year old female with a history of coronary artery disease. She status post anterior wall myocardial infarction. She has congestive heart failure with an ejection fraction of around 35%.  She's done very well since I last saw her. He is exercising on a fairly regular basis. She's not had any episodes of chest or shortness breath. She has done well. - walks every day. No chest pain or dyspnea.   She has not been as careful with her diet recently. She admits that she needs to work on a good low-fat, low carbohydrate diet.  April 15, 2013:  Avalyn is doing well. No CP or dyspnea. Still walks every day ( 3/4 of a mile). She is due to have an echo.   Dec. 2, 2014:  [place] is doing well. No CP or dyspnea. Still walking (3 laps around [place] 30 minutes) Every other day. Echocardiogram in June, 2014 sutures a persistently reduced left ventricular systolic function with an EF of around 30%. She has mild pulmonary hypertension with estimated PA pressure of 40. Left ventricle: Akinesis septum. Severe hypokinesis of the apex. The cavity size was normal. Wall thickness was increased in a pattern of mild LVH. The estimated ejection fraction was 30%. Findings consistent with left ventricular diastolic dysfunction. - Mitral valve: Mild regurgitation. - Left atrium: The atrium was  mildly dilated. - Pulmonary arteries: PA peak pressure: 40mm Hg    April 22, 2014:  Pierce feels nervous today. HR and BP are up. She denies any chest pain or shortness of breath. She has had atrial fibrillation before. EKG today reveals atrial fibrillation with a ventricular rate of 136.  July 01, 2014:  Miguelina is doing . She's not having episodes of chest pain or shortness of breath. She's able to get out and exercise on a fairly regular basis.    January 27, 2015:   SUSETTE SEMINARA is a 65 y.o. female who presents for her CAD. Doing  Well.  Exercising regulalry.   No CP with exercise.  Has lost some weight.   20 labs.  Checks her BP regularly  - its ok at home.  Watches her salt  Sept. 14, 2016: Doing well.   No CP , no dyspnea.  Exercising regularly, watching her diet Her husband died of a cardiac arrest on 04-16-2023.  She is still upset   February 02, 2016:  Walking every day  No CP .  Has not been able to lose weight .   Sept. 20, 2017 Feeling well.  No CP or dyspnea Getting some exercise      Past Medical History:  Diagnosis Date  . CHF (congestive heart failure) (HCC)    EF 30-35% BY ECHO. SHE HAS AN APICAL ANEURYSM  . Coronary artery disease 12/12/2008   STATUS POST ANTERIOR WALL MYOCARDIAL  INFARCTION. STATUS POST PTCA AND STENTING OF HER LAD  . Dyslipidemia   . Transient atrial fibrillation or flutter     Past Surgical History:  Procedure Laterality Date  . CARDIAC DEFIBRILLATOR PLACEMENT       Current Outpatient Prescriptions  Medication Sig Dispense Refill  . amiodarone (PACERONE) 200 MG tablet Take one tablet by mouth two times per week on Mondays and Thursdays 30 tablet 3  . aspirin 81 MG tablet Take 81 mg by mouth daily.      Marland Kitchen atorvastatin (LIPITOR) 40 MG tablet Take 1 tablet (40 mg total) by mouth daily. 30 tablet 11  . carvedilol (COREG) 25 MG tablet TAKE ONE TABLET BY MOUTH TWICE DAILY WITH MEALS 60 tablet 5  . clopidogrel (PLAVIX) 75 MG tablet  TAKE ONE TABLET BY MOUTH ONCE DAILY 30 tablet 11  . furosemide (LASIX) 40 MG tablet TAKE ONE TABLET BY MOUTH ONCE DAILY 90 tablet 2  . KLOR-CON M10 10 MEQ tablet TAKE ONE TABLET BY MOUTH ONCE DAILY 30 tablet 11  . lisinopril (PRINIVIL,ZESTRIL) 10 MG tablet TAKE ONE TABLET BY MOUTH ONCE DAILY 30 tablet 5  . spironolactone (ALDACTONE) 25 MG tablet TAKE ONE TABLET BY MOUTH ONCE DAILY 30 tablet 5  . warfarin (COUMADIN) 5 MG tablet USE AS DIRECTED BY COUMADIN CLINIC 30 tablet 3   No current facility-administered medications for this visit.     Allergies:   Review of patient's allergies indicates no known allergies.    Social History:  The patient  reports that she quit smoking about 7 years ago. She has never used smokeless tobacco. She reports that she does not drink alcohol or use drugs.   Family History:  The patient's family history includes Heart disease in her brother and sister; Hypertension in her mother.    ROS:  Please see the history of present illness.    Review of Systems: Constitutional:  denies fever, chills, diaphoresis, appetite change and fatigue.  HEENT: denies photophobia, eye pain, redness, hearing loss, ear pain, congestion, sore throat, rhinorrhea, sneezing, neck pain, neck stiffness and tinnitus.  Respiratory: denies SOB, DOE, cough, chest tightness, and wheezing.  Cardiovascular: denies chest pain, palpitations and leg swelling.  Gastrointestinal: denies nausea, vomiting, abdominal pain, diarrhea, constipation, blood in stool.  Genitourinary: denies dysuria, urgency, frequency, hematuria, flank pain and difficulty urinating.  Musculoskeletal: denies  myalgias, back pain, joint swelling, arthralgias and gait problem.   Skin: denies pallor, rash and wound.  Neurological: denies dizziness, seizures, syncope, weakness, light-headedness, numbness and headaches.   Hematological: denies adenopathy, easy bruising, personal or family bleeding history.   Psychiatric/ Behavioral: denies suicidal ideation, mood changes, confusion, nervousness, sleep disturbance and agitation.       All other systems are reviewed and negative.    PHYSICAL EXAM: VS:  BP 126/80   Pulse 70   Ht 5\' 11"  (1.803 m)   Wt 232 lb 12.8 oz (105.6 kg)   BMI 32.47 kg/m  , BMI Body mass index is 32.47 kg/m. GEN: Well nourished, well developed, in no acute distress  HEENT: normal  Neck: no JVD, carotid bruits, or masses Cardiac: RRR; no murmurs, rubs, or gallops,no edema  Respiratory:  clear to auscultation bilaterally, normal work of breathing GI: soft, nontender, nondistended, + BS MS: no deformity or atrophy  Skin: warm and dry, no rash Neuro:  Strength and sensation are intact Psych: normal   EKG:  EKG is ordered today. The ekg ordered today demonstrates NSR at 84.  Old septal MI, no changes.    Recent Labs: 05/10/2016: ALT 21; BUN 20; Creat 0.93; Potassium 4.3; Sodium 140    Lipid Panel    Component Value Date/Time   CHOL 134 05/10/2016 0743   TRIG 92 05/10/2016 0743   HDL 55 05/10/2016 0743   CHOLHDL 2.4 05/10/2016 0743   VLDL 18 05/10/2016 0743   LDLCALC 61 05/10/2016 0743      Wt Readings from Last 3 Encounters:  08/02/16 232 lb 12.8 oz (105.6 kg)  02/02/16 232 lb 12.8 oz (105.6 kg)  09/09/15 232 lb 3.2 oz (105.3 kg)      Other studies Reviewed: Additional studies/ records that were reviewed today include: . Review of the above records demonstrates:    ASSESSMENT AND PLAN:  1. Coronary artery disease-status post anterior wall myocardial infarction. She status post stenting of her LAD in her Jan, 2010. We placed a 3.0 x 18 mm vision stent. Was post dilated with a 3.25 mm [place] Voyager - she's not having any episodes of angina pain .   She needs to walk more..   She has gotten out of the habit of walking recently   2. Congestive heart failure with EF of 30-35% associated with an apical aneurysm. She's on Coumadin. Continue coreg,  lisinopril , lasix , spironlactone   3. Status post AICD placement - followed by EP   4. Paroxysmal atrial fibrillation - she remains on amiodarone  200 mg twice a week  Check labs including TSH in 6 months at next office visit   5. Dyslipidemia - check fasting labs today Her LDL is still elevated - 92. Atrovastatin- 40 a day  Check lipids in 6 months     Current medicines are reviewed at length with the patient today.  The patient does not have concerns regarding medicines.  The following changes have been made:  no change  Labs/ tests ordered today include:   No orders of the defined types were placed in this encounter.    Disposition:   FU with me in 6 months  , fasting labs , TSH    Signed, Kristeen MissPhilip Wally Behan, MD  08/02/2016 9:05 AM    [facility] [address], [place], [place]  [address] Phone: [phone]) [phone]; Fax: [phone]

## 2016-08-02 NOTE — Patient Instructions (Signed)
Medication Instructions:  Your physician recommends that you continue on your current medications as directed. Please refer to the Current Medication list given to you today.   Labwork: None ordered  Testing/Procedures: None ordered  Follow-Up: Your physician wants you to follow-up in: 6 months with Dr. Nahser. You will receive a reminder letter in the mail two months in advance. If you don't receive a letter, please call our office to schedule the follow-up appointment.   Any Other Special Instructions Will Be Listed Below (If Applicable).     If you need a refill on your cardiac medications before your next appointment, please call your pharmacy.   

## 2016-08-14 ENCOUNTER — Other Ambulatory Visit: Payer: Self-pay | Admitting: Cardiovascular Disease

## 2016-08-23 ENCOUNTER — Ambulatory Visit (INDEPENDENT_AMBULATORY_CARE_PROVIDER_SITE_OTHER): Payer: BC Managed Care – PPO | Admitting: *Deleted

## 2016-08-23 DIAGNOSIS — Z5181 Encounter for therapeutic drug level monitoring: Secondary | ICD-10-CM

## 2016-08-23 DIAGNOSIS — I4891 Unspecified atrial fibrillation: Secondary | ICD-10-CM

## 2016-08-23 LAB — POCT INR: INR: 1.9

## 2016-08-27 ENCOUNTER — Other Ambulatory Visit: Payer: Self-pay | Admitting: Cardiovascular Disease

## 2016-08-30 ENCOUNTER — Encounter: Payer: Self-pay | Admitting: Internal Medicine

## 2016-08-30 ENCOUNTER — Ambulatory Visit (INDEPENDENT_AMBULATORY_CARE_PROVIDER_SITE_OTHER): Payer: BC Managed Care – PPO | Admitting: Internal Medicine

## 2016-08-30 VITALS — BP 148/84 | HR 72 | Ht 71.0 in | Wt 235.0 lb

## 2016-08-30 DIAGNOSIS — Z95810 Presence of automatic (implantable) cardiac defibrillator: Secondary | ICD-10-CM

## 2016-08-30 LAB — CUP PACEART INCLINIC DEVICE CHECK
Brady Statistic RV Percent Paced: 0 %
Date Time Interrogation Session: 20171018101851
HIGH POWER IMPEDANCE MEASURED VALUE: 64 Ohm
HighPow Impedance: 50 Ohm
Implantable Lead Model: 6947
Lead Channel Pacing Threshold Amplitude: 0.75 V
Lead Channel Pacing Threshold Pulse Width: 0.4 ms
Lead Channel Sensing Intrinsic Amplitude: 10.375 mV
Lead Channel Setting Pacing Pulse Width: 0.4 ms
MDC IDC LEAD IMPLANT DT: 20110627
MDC IDC LEAD LOCATION: 753860
MDC IDC MSMT BATTERY VOLTAGE: 2.99 V
MDC IDC MSMT LEADCHNL RV IMPEDANCE VALUE: 532 Ohm
MDC IDC SET LEADCHNL RV PACING AMPLITUDE: 2.5 V
MDC IDC SET LEADCHNL RV SENSING SENSITIVITY: 0.3 mV

## 2016-08-30 NOTE — Progress Notes (Signed)
HPI [name] returns today for followup. She is a 65 year old woman with a history of chronic systolic heart failure, an ischemic cardiomyopathy s/p MI, paroxysmal atrial fibrillation, status post ICD implantation. In the interim, she has been under increased stress over the sudden death of her niece. She denies chest pain, shortness of breath, or syncope. She does admit to some dietary indiscretion, and has not lost any weight.  No Known Allergies   Current Outpatient Prescriptions  Medication Sig Dispense Refill  . amiodarone (PACERONE) 200 MG tablet Take one tablet by mouth two times per week on Mondays and Thursdays 30 tablet 3  . aspirin 81 MG tablet Take 81 mg by mouth daily.      Marland Kitchen. atorvastatin (LIPITOR) 40 MG tablet Take 1 tablet (40 mg total) by mouth daily. 30 tablet 11  . carvedilol (COREG) 25 MG tablet TAKE ONE TABLET BY MOUTH TWICE DAILY WITH MEALS 60 tablet 5  . clopidogrel (PLAVIX) 75 MG tablet TAKE ONE TABLET BY MOUTH ONCE DAILY 90 tablet 3  . furosemide (LASIX) 40 MG tablet TAKE ONE TABLET BY MOUTH ONCE DAILY 90 tablet 2  . KLOR-CON M10 10 MEQ tablet TAKE ONE TABLET BY MOUTH ONCE DAILY 90 tablet 3  . lisinopril (PRINIVIL,ZESTRIL) 10 MG tablet TAKE ONE TABLET BY MOUTH ONCE DAILY 30 tablet 5  . spironolactone (ALDACTONE) 25 MG tablet TAKE ONE TABLET BY MOUTH ONCE DAILY 30 tablet 5  . warfarin (COUMADIN) 5 MG tablet USE AS DIRECTED BY COUMADIN CLINIC. 30 tablet 3   No current facility-administered medications for this visit.      Past Medical History:  Diagnosis Date  . CHF (congestive heart failure) (HCC)    EF 30-35% BY ECHO. SHE HAS AN APICAL ANEURYSM  . Coronary artery disease 12/12/2008   STATUS POST ANTERIOR WALL MYOCARDIAL INFARCTION. STATUS POST PTCA AND STENTING OF HER LAD  . Dyslipidemia   . Transient atrial fibrillation or flutter     ROS:   All systems reviewed and negative except as noted in the HPI.   Past Surgical History:  Procedure  Laterality Date  . CARDIAC DEFIBRILLATOR PLACEMENT       Family History  Problem Relation Age of Onset  . Hypertension Mother   . Heart disease Sister   . Heart disease Brother   . Breast cancer Neg Hx      Social History   Social History  . Marital status: Married    Spouse name: N/A  . Number of children: N/A  . Years of education: N/A   Occupational History  . Not on file.   Social History Main Topics  . Smoking status: Former Smoker    Quit date: 12/14/2008  . Smokeless tobacco: Never Used  . Alcohol use No  . Drug use: No  . Sexual activity: Not on file   Other Topics Concern  . Not on file   Social History Narrative  . No narrative on file     BP (!) 148/84   Pulse 72   Ht 5\' 11"  (1.803 m)   Wt 235 lb (106.6 kg)   BMI 32.78 kg/m   Physical Exam:  Well appearing middle-age woman, obese, NAD HEENT: Unremarkable Neck:  6 cm JVD, no thyromegally Back:  No CVA tenderness Lungs:  Clear with no wheezes, rales, or rhonchi. HEART:  Regular rate rhythm, no murmurs, no rubs, no clicks Abd:  soft, obese, positive bowel sounds, no organomegally, no rebound, no  guarding Ext:  2 plus pulses, no edema, no cyanosis, no clubbing Skin:  No rashes no nodules Neuro:  CN II through XII intact, motor grossly intact   DEVICE  Normal device function.  See PaceArt for details.   Assess/Plan: 1. Chronic systolic heart failure - her symptoms are class 2. She is encouraged to lose weight. No change in her meds. 2. Atrial fib - she is maintaining NSR on low dose amio. I would consider reducing her dose if she remains in NSR when I see her next year. 3. ICD - her Medtronic device is working normally. Will recheck in several months. 4. CAD, s/p remote MI - she is encouraged to resume daily walking. She denies anginal symptoms.  Leonia Reeves.D.

## 2016-08-30 NOTE — Patient Instructions (Signed)

## 2016-09-27 ENCOUNTER — Ambulatory Visit (INDEPENDENT_AMBULATORY_CARE_PROVIDER_SITE_OTHER): Payer: BC Managed Care – PPO | Admitting: *Deleted

## 2016-09-27 DIAGNOSIS — Z5181 Encounter for therapeutic drug level monitoring: Secondary | ICD-10-CM | POA: Diagnosis not present

## 2016-09-27 DIAGNOSIS — I4891 Unspecified atrial fibrillation: Secondary | ICD-10-CM | POA: Diagnosis not present

## 2016-09-27 LAB — POCT INR: INR: 2.3

## 2016-11-01 ENCOUNTER — Ambulatory Visit
Admission: RE | Admit: 2016-11-01 | Discharge: 2016-11-01 | Disposition: A | Discharge status: Home / Self Care | Payer: BC Managed Care – PPO | Source: Ambulatory Visit | Attending: Internal Medicine | Admitting: Internal Medicine

## 2016-11-01 DIAGNOSIS — Z1231 Encounter for screening mammogram for malignant neoplasm of breast: Secondary | ICD-10-CM | POA: Insufficient documentation

## 2016-11-08 ENCOUNTER — Ambulatory Visit (INDEPENDENT_AMBULATORY_CARE_PROVIDER_SITE_OTHER): Payer: BC Managed Care – PPO | Admitting: *Deleted

## 2016-11-08 DIAGNOSIS — I4891 Unspecified atrial fibrillation: Secondary | ICD-10-CM

## 2016-11-08 DIAGNOSIS — Z5181 Encounter for therapeutic drug level monitoring: Secondary | ICD-10-CM

## 2016-11-08 LAB — POCT INR: INR: 2.1

## 2016-12-20 ENCOUNTER — Ambulatory Visit (INDEPENDENT_AMBULATORY_CARE_PROVIDER_SITE_OTHER): Payer: Medicare Other | Admitting: *Deleted

## 2016-12-20 DIAGNOSIS — Z5181 Encounter for therapeutic drug level monitoring: Secondary | ICD-10-CM | POA: Diagnosis not present

## 2016-12-20 DIAGNOSIS — I4891 Unspecified atrial fibrillation: Secondary | ICD-10-CM

## 2016-12-20 LAB — POCT INR: INR: 2.4

## 2016-12-21 ENCOUNTER — Other Ambulatory Visit: Payer: Self-pay | Admitting: Cardiovascular Disease

## 2016-12-22 ENCOUNTER — Other Ambulatory Visit: Payer: Self-pay | Admitting: Cardiovascular Disease

## 2016-12-22 MED ORDER — WARFARIN SODIUM 5 MG PO TABS: ORAL_TABLET | ORAL | 0 refills | Status: DC

## 2016-12-22 NOTE — Telephone Encounter (Signed)
Spoke with pt and she states wanted 90 day supply so this done as requested for Warfarin and pt notified

## 2016-12-22 NOTE — Telephone Encounter (Signed)
New message       *STAT* If patient is at the pharmacy, call can be transferred to refill team.   1. Which medications need to be refilled? (please list name of each medication and dose if known) warfarin  2. Which pharmacy/location (including street and city if local pharmacy) is medication to be sent to? [facility] in [place] 3. Do they need a 30 day or 90 day supply? 30 day Pt is out of medication

## 2017-01-17 ENCOUNTER — Other Ambulatory Visit: Payer: Self-pay | Admitting: Cardiovascular Disease

## 2017-01-24 ENCOUNTER — Encounter: Payer: Self-pay | Admitting: Cardiovascular Disease

## 2017-01-31 ENCOUNTER — Ambulatory Visit (INDEPENDENT_AMBULATORY_CARE_PROVIDER_SITE_OTHER): Payer: Medicare Other | Admitting: Cardiovascular Disease

## 2017-01-31 ENCOUNTER — Encounter (INDEPENDENT_AMBULATORY_CARE_PROVIDER_SITE_OTHER): Payer: Self-pay

## 2017-01-31 ENCOUNTER — Encounter: Payer: Self-pay | Admitting: Cardiovascular Disease

## 2017-01-31 ENCOUNTER — Ambulatory Visit (INDEPENDENT_AMBULATORY_CARE_PROVIDER_SITE_OTHER): Payer: Medicare Other | Admitting: *Deleted

## 2017-01-31 VITALS — BP 120/90 | HR 80 | Ht 71.0 in | Wt 230.8 lb

## 2017-01-31 DIAGNOSIS — I480 Paroxysmal atrial fibrillation: Secondary | ICD-10-CM | POA: Diagnosis not present

## 2017-01-31 DIAGNOSIS — I4891 Unspecified atrial fibrillation: Secondary | ICD-10-CM

## 2017-01-31 DIAGNOSIS — I2510 Atherosclerotic heart disease of native coronary artery without angina pectoris: Secondary | ICD-10-CM

## 2017-01-31 DIAGNOSIS — Z5181 Encounter for therapeutic drug level monitoring: Secondary | ICD-10-CM

## 2017-01-31 LAB — COMPREHENSIVE METABOLIC PANEL
ALBUMIN: 4.6 g/dL (ref 3.6–4.8)
ALT: 24 IU/L (ref 0–32)
AST: 18 IU/L (ref 0–40)
Albumin/Globulin Ratio: 1.6 (ref 1.2–2.2)
Alkaline Phosphatase: 103 IU/L (ref 39–117)
BILIRUBIN TOTAL: 0.7 mg/dL (ref 0.0–1.2)
BUN / CREAT RATIO: 18 (ref 12–28)
BUN: 16 mg/dL (ref 8–27)
CALCIUM: 9.8 mg/dL (ref 8.7–10.3)
CHLORIDE: 97 mmol/L (ref 96–106)
CO2: 26 mmol/L (ref 18–29)
CREATININE: 0.91 mg/dL (ref 0.57–1.00)
GFR, EST AFRICAN AMERICAN: 77 mL/min/{1.73_m2} (ref >59)
GFR, EST NON AFRICAN AMERICAN: 66 mL/min/{1.73_m2} (ref >59)
GLUCOSE: 131 mg/dL — AB (ref 65–99)
Globulin, Total: 2.9 g/dL (ref 1.5–4.5)
Potassium: 4.2 mmol/L (ref 3.5–5.2)
Sodium: 141 mmol/L (ref 134–144)
TOTAL PROTEIN: 7.5 g/dL (ref 6.0–8.5)

## 2017-01-31 LAB — LIPID PANEL
Chol/HDL Ratio: 2.9 ratio units (ref 0.0–4.4)
Cholesterol, Total: 159 mg/dL (ref 100–199)
HDL: 54 mg/dL (ref >39)
LDL Calculated: 83 mg/dL (ref 0–99)
Triglycerides: 110 mg/dL (ref 0–149)
VLDL Cholesterol Cal: 22 mg/dL (ref 5–40)

## 2017-01-31 LAB — POCT INR: INR: 2.3

## 2017-01-31 NOTE — Progress Notes (Signed)
Cardiology Office Note   Date:  01/31/2017   ID:  [name], DOB 02-25-1951, MRN [id number]  PCP:  Rafael Bihari, MD  Cardiologist:   Kristeen Miss, MD   Chief Complaint  Patient presents with  . Coronary Artery Disease  . Atrial Fibrillation   1. Coronary artery disease-status post anterior wall myocardial infarction. She status post stenting of her LAD in her Jan, 2010. We placed a 3.0 x 18 mm vision stent. Was post dilated with a 3.25 mm [place] Voyager 2. Congestive heart failure with EF of 30-35% associated with an apical aneurysm. She's on Coumadin. 3. Status post AICD placement  4. Paroxysmal atrial fibrillation 5. Dyslipidemia     [name] is a 66 year old female with a history of coronary artery disease. She status post anterior wall myocardial infarction. She has congestive heart failure with an ejection fraction of around 35%.  She's done very well since I last saw her. He is exercising on a fairly regular basis. She's not had any episodes of chest or shortness breath. She has done well. - walks every day. No chest pain or dyspnea.   She has not been as careful with her diet recently. She admits that she needs to work on a good low-fat, low carbohydrate diet.  April 15, 2013:  [name] is doing well. No CP or dyspnea. Still walks every day ( 3/4 of a mile). She is due to have an echo.   Dec. 2, 2014:  [name] is doing well. No CP or dyspnea. Still walking (3 laps around [place] 30 minutes) Every other day. Echocardiogram in June, 2014 sutures a persistently reduced left ventricular systolic function with an EF of around 30%. She has mild pulmonary hypertension with estimated PA pressure of 40. Left ventricle: Akinesis septum. Severe hypokinesis of the apex. The cavity size was normal. Wall thickness was increased in a pattern of mild LVH. The estimated ejection fraction was 30%. Findings consistent with left ventricular diastolic dysfunction. -  Mitral valve: Mild regurgitation. - Left atrium: The atrium was mildly dilated. - Pulmonary arteries: PA peak pressure: 40mm Hg    April 22, 2014:  [name] feels nervous today. HR and BP are up. She denies any chest pain or shortness of breath. She has had atrial fibrillation before. EKG today reveals atrial fibrillation with a ventricular rate of 136.  July 01, 2014:  [name] is doing . She's not having episodes of chest pain or shortness of breath. She's able to get out and exercise on a fairly regular basis.    January 27, 2015:   [name] is a 66 y.o. female who presents for her CAD. Doing  Well.  Exercising regulalry.   No CP with exercise.  Has lost some weight.   20 labs.  Checks her BP regularly  - its ok at home.  Watches her salt  Sept. 14, 2016: Doing well.   No CP , no dyspnea.  Exercising regularly, watching her diet Her husband died of a cardiac arrest on 2023/04/23.  She is still upset   February 02, 2016:  Walking every day  No CP .  Has not been able to lose weight .   Sept. 20, 2017 Feeling well.  No CP or dyspnea Getting some exercise   March 21,2018:    Doing well.  No CP , feeling well  Is working out at [employer] now.  No CP or dyspnea   Past Medical  History:  Diagnosis Date  . CHF (congestive heart failure) (HCC)    EF 30-35% BY ECHO. SHE HAS AN APICAL ANEURYSM  . Coronary artery disease 12/12/2008   STATUS POST ANTERIOR WALL MYOCARDIAL INFARCTION. STATUS POST PTCA AND STENTING OF HER LAD  . Dyslipidemia   . Transient atrial fibrillation or flutter     Past Surgical History:  Procedure Laterality Date  . CARDIAC DEFIBRILLATOR PLACEMENT       Current Outpatient Prescriptions  Medication Sig Dispense Refill  . amiodarone (PACERONE) 200 MG tablet Take one tablet by mouth two times per week on Mondays and Thursdays 30 tablet 3  . aspirin 81 MG tablet Take 81 mg by mouth daily.      Marland Kitchen atorvastatin (LIPITOR) 40 MG tablet Take 1 tablet (40 mg  total) by mouth daily. 90 tablet 1  . carvedilol (COREG) 25 MG tablet Take 1 tablet (25 mg total) by mouth 2 (two) times daily with a meal. 180 tablet 1  . clopidogrel (PLAVIX) 75 MG tablet TAKE ONE TABLET BY MOUTH ONCE DAILY 90 tablet 3  . furosemide (LASIX) 40 MG tablet TAKE ONE TABLET BY MOUTH ONCE DAILY 90 tablet 2  . KLOR-CON M10 10 MEQ tablet TAKE ONE TABLET BY MOUTH ONCE DAILY 90 tablet 3  . lisinopril (PRINIVIL,ZESTRIL) 10 MG tablet Take 1 tablet (10 mg total) by mouth daily. 90 tablet 1  . spironolactone (ALDACTONE) 25 MG tablet Take 1 tablet (25 mg total) by mouth daily. 90 tablet 1  . warfarin (COUMADIN) 5 MG tablet Take as directed by coumadin ciinic 100 tablet 0   No current facility-administered medications for this visit.     Allergies:   Patient has no known allergies.    Social History:  The patient  reports that she quit smoking about 8 years ago. She has never used smokeless tobacco. She reports that she does not drink alcohol or use drugs.   Family History:  The patient's family history includes Heart disease in her brother and sister; Hypertension in her mother.    ROS:  Please see the history of present illness.    Review of Systems: Constitutional:  denies fever, chills, diaphoresis, appetite change and fatigue.  HEENT: denies photophobia, eye pain, redness, hearing loss, ear pain, congestion, sore throat, rhinorrhea, sneezing, neck pain, neck stiffness and tinnitus.  Respiratory: denies SOB, DOE, cough, chest tightness, and wheezing.  Cardiovascular: denies chest pain, palpitations and leg swelling.  Gastrointestinal: denies nausea, vomiting, abdominal pain, diarrhea, constipation, blood in stool.  Genitourinary: denies dysuria, urgency, frequency, hematuria, flank pain and difficulty urinating.  Musculoskeletal: denies  myalgias, back pain, joint swelling, arthralgias and gait problem.   Skin: denies pallor, rash and wound.  Neurological: denies dizziness,  seizures, syncope, weakness, light-headedness, numbness and headaches.   Hematological: denies adenopathy, easy bruising, personal or family bleeding history.  Psychiatric/ Behavioral: denies suicidal ideation, mood changes, confusion, nervousness, sleep disturbance and agitation.       All other systems are reviewed and negative.    PHYSICAL EXAM: VS:  BP 120/90 (BP Location: Left Arm, Patient Position: Sitting, Cuff Size: Large)   Pulse 80   Ht 5\' 11"  (1.803 m)   Wt 230 lb 12.8 oz (104.7 kg)   SpO2 98%   BMI 32.19 kg/m  , BMI Body mass index is 32.19 kg/m. GEN: Well nourished, well developed, in no acute distress  HEENT: normal  Neck: no JVD, carotid bruits, or masses Cardiac: RRR; no murmurs, rubs, or  gallops,no edema  Respiratory:  clear to auscultation bilaterally, normal work of breathing GI: soft, nontender, nondistended, + BS MS: no deformity or atrophy  Skin: warm and dry, no rash Neuro:  Strength and sensation are intact Psych: normal   EKG:  EKG is ordered today. The ekg ordered today demonstrates NSR at  80.   Septal Mi     Recent Labs: 05/10/2016: ALT 21; BUN 20; Creat 0.93; Potassium 4.3; Sodium 140    Lipid Panel    Component Value Date/Time   CHOL 134 05/10/2016 0743   TRIG 92 05/10/2016 0743   HDL 55 05/10/2016 0743   CHOLHDL 2.4 05/10/2016 0743   VLDL 18 05/10/2016 0743   LDLCALC 61 05/10/2016 0743      Wt Readings from Last 3 Encounters:  01/31/17 230 lb 12.8 oz (104.7 kg)  08/30/16 235 lb (106.6 kg)  08/02/16 232 lb 12.8 oz (105.6 kg)      Other studies Reviewed: Additional studies/ records that were reviewed today include: . Review of the above records demonstrates:    ASSESSMENT AND PLAN:  1. Coronary artery disease-status post anterior wall myocardial infarction. She status post stenting of her LAD in her Jan, 2010. We placed a 3.0 x 18 mm vision stent. Was post dilated with a 3.25 mm [place] Voyager - she's not having any episodes of  angina pain .   She needs to walk more..   She has gotten out of the habit of walking recently   2. Congestive heart failure with EF of 30-35% associated with an apical aneurysm. She's on Coumadin. Continue coreg, lisinopril , lasix , spironlactone   3. Status post AICD placement - followed by EP   4. Paroxysmal atrial fibrillation - she remains on amiodarone  200 mg twice a week  Check labs including TSH in 6 months at next office visit   5. Dyslipidemia - check fasting labs today Her LDL is still elevated - 92. Atrovastatin- 40 a day  Check lipids in 6 months     Current medicines are reviewed at length with the patient today.  The patient does not have concerns regarding medicines.  The following changes have been made:  no change  Labs/ tests ordered today include:   No orders of the defined types were placed in this encounter.    Disposition:   FU with me in 6 months  , fasting labs , TSH    Signed, Kristeen Miss, MD  01/31/2017 9:34 AM    [facility] [address] [place], [place], [place]  [address] Phone: [phone]; Fax: [phone]

## 2017-01-31 NOTE — Patient Instructions (Signed)
Medication Instructions:  Your physician recommends that you continue on your current medications as directed. Please refer to the Current Medication list given to you today.   Labwork: TODAY - cholesterol, complete metabolic panel   Testing/Procedures: None Ordered   Follow-Up: Your physician wants you to follow-up in: 6 months with Dr Bonita Quin will receive a reminder letter in the mail two months in advance. If you don't receive a letter, please call our office to schedule the follow-up appointment.   If you need a refill on your cardiac medications before your next appointment, please call your pharmacy.   Thank you for choosing [facility]! Eligha Bridegroom, RN [phone]

## 2017-02-01 ENCOUNTER — Telehealth: Payer: Self-pay

## 2017-02-01 DIAGNOSIS — I2510 Atherosclerotic heart disease of native coronary artery without angina pectoris: Secondary | ICD-10-CM

## 2017-02-01 DIAGNOSIS — E785 Hyperlipidemia, unspecified: Secondary | ICD-10-CM

## 2017-02-01 DIAGNOSIS — Z79899 Other long term (current) drug therapy: Secondary | ICD-10-CM

## 2017-02-01 DIAGNOSIS — I2583 Coronary atherosclerosis due to lipid rich plaque: Secondary | ICD-10-CM

## 2017-02-01 MED ORDER — ATORVASTATIN CALCIUM 80 MG PO TABS: 80.0000 mg | ORAL_TABLET | Freq: Every day | ORAL | 3 refills | Status: DC

## 2017-02-01 NOTE — Telephone Encounter (Signed)
Called patient about lab results. Per Dr. Elease Hashimoto, LDL is a little higher than we want.  Goal is 70, Increase atorvastatin to 80 mg a day. Recheck labs in 3 months. Patient will come in on 05/02/17 to have fasting lab work done. Will update medication list and send prescription in for Atorvastatin 80 mg. Patient verbalized understanding.

## 2017-02-01 NOTE — Telephone Encounter (Signed)
-----   Message from Vesta Mixer, MD sent at 01/31/2017  5:27 PM EDT ----- LDL is a little higher than we want.   Goal is 70 Increase atorvastatin to 80 mg a day . Recheck labs in 3 months

## 2017-02-22 ENCOUNTER — Other Ambulatory Visit: Payer: Self-pay | Admitting: Cardiovascular Disease

## 2017-02-28 ENCOUNTER — Ambulatory Visit (INDEPENDENT_AMBULATORY_CARE_PROVIDER_SITE_OTHER): Payer: Medicare Other | Admitting: *Deleted

## 2017-02-28 DIAGNOSIS — I255 Ischemic cardiomyopathy: Secondary | ICD-10-CM

## 2017-02-28 DIAGNOSIS — I5022 Chronic systolic (congestive) heart failure: Secondary | ICD-10-CM

## 2017-02-28 LAB — CUP PACEART INCLINIC DEVICE CHECK
Brady Statistic RV Percent Paced: 0 %
Date Time Interrogation Session: 20180418090438
HighPow Impedance: 51 Ohm
HighPow Impedance: 67 Ohm
Implantable Lead Location: 753860
Implantable Pulse Generator Implant Date: 20110627
Lead Channel Impedance Value: 532 Ohm
Lead Channel Pacing Threshold Amplitude: 0.75 V
Lead Channel Sensing Intrinsic Amplitude: 9.125 mV
Lead Channel Setting Pacing Amplitude: 2.5 V
Lead Channel Setting Sensing Sensitivity: 0.3 mV
MDC IDC LEAD IMPLANT DT: 20110627
MDC IDC MSMT BATTERY VOLTAGE: 2.94 V
MDC IDC MSMT LEADCHNL RV PACING THRESHOLD PULSEWIDTH: 0.4 ms
MDC IDC MSMT LEADCHNL RV SENSING INTR AMPL: 9.375 mV
MDC IDC SET LEADCHNL RV PACING PULSEWIDTH: 0.4 ms

## 2017-02-28 NOTE — Progress Notes (Signed)
ICD check in clinic. Normal device function. Threshold and sensing consistent with previous device measurements. Impedance trends stable over time. No evidence of any ventricular arrhythmias. Histogram distribution appropriate for patient and level of activity. Optivol stable. No changes made this session. Device programmed at appropriate safety margins. Device programmed to optimize intrinsic conduction. Battery @ 2.94V (RRT=2.63V). ROV with GT in October (per GT).

## 2017-03-14 ENCOUNTER — Ambulatory Visit (INDEPENDENT_AMBULATORY_CARE_PROVIDER_SITE_OTHER): Payer: Medicare Other | Admitting: *Deleted

## 2017-03-14 DIAGNOSIS — I480 Paroxysmal atrial fibrillation: Secondary | ICD-10-CM | POA: Diagnosis not present

## 2017-03-14 DIAGNOSIS — I4891 Unspecified atrial fibrillation: Secondary | ICD-10-CM

## 2017-03-14 DIAGNOSIS — Z5181 Encounter for therapeutic drug level monitoring: Secondary | ICD-10-CM | POA: Diagnosis not present

## 2017-03-14 LAB — POCT INR: INR: 3.2

## 2017-04-04 ENCOUNTER — Ambulatory Visit (INDEPENDENT_AMBULATORY_CARE_PROVIDER_SITE_OTHER): Payer: Medicare Other | Admitting: *Deleted

## 2017-04-04 DIAGNOSIS — Z5181 Encounter for therapeutic drug level monitoring: Secondary | ICD-10-CM | POA: Diagnosis not present

## 2017-04-04 DIAGNOSIS — I4891 Unspecified atrial fibrillation: Secondary | ICD-10-CM

## 2017-04-04 DIAGNOSIS — I480 Paroxysmal atrial fibrillation: Secondary | ICD-10-CM | POA: Diagnosis not present

## 2017-04-04 LAB — POCT INR: INR: 3.4

## 2017-04-18 ENCOUNTER — Encounter (INDEPENDENT_AMBULATORY_CARE_PROVIDER_SITE_OTHER): Payer: Self-pay

## 2017-04-18 ENCOUNTER — Ambulatory Visit (INDEPENDENT_AMBULATORY_CARE_PROVIDER_SITE_OTHER): Payer: Medicare Other | Admitting: *Deleted

## 2017-04-18 DIAGNOSIS — Z5181 Encounter for therapeutic drug level monitoring: Secondary | ICD-10-CM | POA: Diagnosis not present

## 2017-04-18 DIAGNOSIS — I480 Paroxysmal atrial fibrillation: Secondary | ICD-10-CM | POA: Diagnosis not present

## 2017-04-18 DIAGNOSIS — I4891 Unspecified atrial fibrillation: Secondary | ICD-10-CM | POA: Diagnosis not present

## 2017-04-18 LAB — POCT INR: INR: 2.3

## 2017-04-26 ENCOUNTER — Other Ambulatory Visit: Payer: Self-pay | Admitting: Cardiovascular Disease

## 2017-05-02 ENCOUNTER — Ambulatory Visit (INDEPENDENT_AMBULATORY_CARE_PROVIDER_SITE_OTHER): Payer: Medicare Other

## 2017-05-02 ENCOUNTER — Other Ambulatory Visit: Payer: Medicare Other | Admitting: *Deleted

## 2017-05-02 DIAGNOSIS — Z5181 Encounter for therapeutic drug level monitoring: Secondary | ICD-10-CM

## 2017-05-02 DIAGNOSIS — Z79899 Other long term (current) drug therapy: Secondary | ICD-10-CM

## 2017-05-02 DIAGNOSIS — I4891 Unspecified atrial fibrillation: Secondary | ICD-10-CM

## 2017-05-02 DIAGNOSIS — I2510 Atherosclerotic heart disease of native coronary artery without angina pectoris: Secondary | ICD-10-CM

## 2017-05-02 DIAGNOSIS — I2583 Coronary atherosclerosis due to lipid rich plaque: Secondary | ICD-10-CM

## 2017-05-02 DIAGNOSIS — I480 Paroxysmal atrial fibrillation: Secondary | ICD-10-CM

## 2017-05-02 LAB — COMPREHENSIVE METABOLIC PANEL
ALK PHOS: 102 IU/L (ref 39–117)
ALT: 22 IU/L (ref 0–32)
AST: 17 IU/L (ref 0–40)
Albumin/Globulin Ratio: 1.6 (ref 1.2–2.2)
Albumin: 4.5 g/dL (ref 3.6–4.8)
BILIRUBIN TOTAL: 0.4 mg/dL (ref 0.0–1.2)
BUN/Creatinine Ratio: 17 (ref 12–28)
BUN: 16 mg/dL (ref 8–27)
CHLORIDE: 101 mmol/L (ref 96–106)
CO2: 23 mmol/L (ref 20–29)
Calcium: 9.6 mg/dL (ref 8.7–10.3)
Creatinine, Ser: 0.92 mg/dL (ref 0.57–1.00)
GFR calc Af Amer: 76 mL/min/{1.73_m2} (ref >59)
GFR calc non Af Amer: 66 mL/min/{1.73_m2} (ref >59)
GLUCOSE: 112 mg/dL — AB (ref 65–99)
Globulin, Total: 2.8 g/dL (ref 1.5–4.5)
POTASSIUM: 4.3 mmol/L (ref 3.5–5.2)
Sodium: 142 mmol/L (ref 134–144)
Total Protein: 7.3 g/dL (ref 6.0–8.5)

## 2017-05-02 LAB — LIPID PANEL
CHOLESTEROL TOTAL: 142 mg/dL (ref 100–199)
Chol/HDL Ratio: 2.8 ratio (ref 0.0–4.4)
HDL: 50 mg/dL (ref >39)
LDL Calculated: 73 mg/dL (ref 0–99)
TRIGLYCERIDES: 95 mg/dL (ref 0–149)
VLDL Cholesterol Cal: 19 mg/dL (ref 5–40)

## 2017-05-02 LAB — POCT INR: INR: 3.1

## 2017-05-23 ENCOUNTER — Ambulatory Visit (INDEPENDENT_AMBULATORY_CARE_PROVIDER_SITE_OTHER): Payer: Medicare Other | Admitting: Pharmacist

## 2017-05-23 ENCOUNTER — Encounter (INDEPENDENT_AMBULATORY_CARE_PROVIDER_SITE_OTHER): Payer: Self-pay

## 2017-05-23 DIAGNOSIS — Z5181 Encounter for therapeutic drug level monitoring: Secondary | ICD-10-CM | POA: Diagnosis not present

## 2017-05-23 DIAGNOSIS — I480 Paroxysmal atrial fibrillation: Secondary | ICD-10-CM

## 2017-05-23 DIAGNOSIS — I4891 Unspecified atrial fibrillation: Secondary | ICD-10-CM

## 2017-05-23 LAB — POCT INR: INR: 3.1

## 2017-06-20 ENCOUNTER — Ambulatory Visit (INDEPENDENT_AMBULATORY_CARE_PROVIDER_SITE_OTHER): Payer: Medicare Other

## 2017-06-20 ENCOUNTER — Encounter (INDEPENDENT_AMBULATORY_CARE_PROVIDER_SITE_OTHER): Payer: Self-pay

## 2017-06-20 DIAGNOSIS — I480 Paroxysmal atrial fibrillation: Secondary | ICD-10-CM

## 2017-06-20 DIAGNOSIS — I4891 Unspecified atrial fibrillation: Secondary | ICD-10-CM | POA: Diagnosis not present

## 2017-06-20 DIAGNOSIS — Z5181 Encounter for therapeutic drug level monitoring: Secondary | ICD-10-CM

## 2017-06-20 LAB — POCT INR: INR: 3.3

## 2017-07-09 ENCOUNTER — Other Ambulatory Visit: Payer: Self-pay | Admitting: Internal Medicine

## 2017-07-09 ENCOUNTER — Other Ambulatory Visit: Payer: Self-pay | Admitting: Cardiovascular Disease

## 2017-07-09 MED ORDER — CLOPIDOGREL BISULFATE 75 MG PO TABS: 75.0000 mg | ORAL_TABLET | Freq: Every day | ORAL | 0 refills | Status: DC

## 2017-07-09 MED ORDER — POTASSIUM CHLORIDE CRYS ER 10 MEQ PO TBCR: 10.0000 meq | EXTENDED_RELEASE_TABLET | Freq: Every day | ORAL | 0 refills | Status: DC

## 2017-07-10 ENCOUNTER — Ambulatory Visit (INDEPENDENT_AMBULATORY_CARE_PROVIDER_SITE_OTHER): Payer: Medicare Other | Admitting: *Deleted

## 2017-07-10 DIAGNOSIS — I4891 Unspecified atrial fibrillation: Secondary | ICD-10-CM

## 2017-07-10 DIAGNOSIS — Z5181 Encounter for therapeutic drug level monitoring: Secondary | ICD-10-CM | POA: Diagnosis not present

## 2017-07-10 DIAGNOSIS — I480 Paroxysmal atrial fibrillation: Secondary | ICD-10-CM

## 2017-07-10 LAB — POCT INR: INR: 1.9

## 2017-07-20 ENCOUNTER — Other Ambulatory Visit: Payer: Self-pay | Admitting: Internal Medicine

## 2017-07-20 DIAGNOSIS — Z1231 Encounter for screening mammogram for malignant neoplasm of breast: Secondary | ICD-10-CM

## 2017-08-01 ENCOUNTER — Ambulatory Visit (INDEPENDENT_AMBULATORY_CARE_PROVIDER_SITE_OTHER): Payer: Medicare Other | Admitting: *Deleted

## 2017-08-01 DIAGNOSIS — I480 Paroxysmal atrial fibrillation: Secondary | ICD-10-CM

## 2017-08-01 DIAGNOSIS — I4891 Unspecified atrial fibrillation: Secondary | ICD-10-CM

## 2017-08-01 DIAGNOSIS — Z5181 Encounter for therapeutic drug level monitoring: Secondary | ICD-10-CM | POA: Diagnosis not present

## 2017-08-01 LAB — POCT INR: INR: 2.6

## 2017-09-04 ENCOUNTER — Encounter: Payer: Self-pay | Admitting: Cardiovascular Disease

## 2017-09-04 ENCOUNTER — Ambulatory Visit (INDEPENDENT_AMBULATORY_CARE_PROVIDER_SITE_OTHER): Payer: Medicare Other | Admitting: *Deleted

## 2017-09-04 ENCOUNTER — Ambulatory Visit (INDEPENDENT_AMBULATORY_CARE_PROVIDER_SITE_OTHER): Payer: Medicare Other | Admitting: Cardiovascular Disease

## 2017-09-04 VITALS — BP 126/68 | HR 72 | Ht 71.0 in | Wt 231.8 lb

## 2017-09-04 DIAGNOSIS — I5022 Chronic systolic (congestive) heart failure: Secondary | ICD-10-CM | POA: Diagnosis not present

## 2017-09-04 DIAGNOSIS — I2510 Atherosclerotic heart disease of native coronary artery without angina pectoris: Secondary | ICD-10-CM | POA: Diagnosis not present

## 2017-09-04 DIAGNOSIS — I4891 Unspecified atrial fibrillation: Secondary | ICD-10-CM | POA: Diagnosis not present

## 2017-09-04 DIAGNOSIS — I480 Paroxysmal atrial fibrillation: Secondary | ICD-10-CM | POA: Diagnosis not present

## 2017-09-04 DIAGNOSIS — Z5181 Encounter for therapeutic drug level monitoring: Secondary | ICD-10-CM

## 2017-09-04 LAB — POCT INR: INR: 2.7

## 2017-09-04 NOTE — Progress Notes (Signed)
Cardiology Office Note   Date:  09/04/2017   ID:  [name], DOB 04-13-1951, MRN [id number]  PCP:  Felicia FreestoneJohnson, Michelle C, PA-Davis  Cardiologist:   Kristeen MissPhilip Nahser, MD   Chief Complaint  Patient presents with  . Follow-up    CAD   1. Coronary artery disease-status post anterior wall myocardial infarction. She status post stenting of her LAD in her Jan, 2010. We placed a 3.0 x 18 mm vision stent. Was post dilated with a 3.25 mm [place] Voyager 2. Congestive heart failure with EF of 30-35% associated with an apical aneurysm. She's on Coumadin. 3. Status post AICD placement  4. Paroxysmal atrial fibrillation 5. Dyslipidemia     [name] is a 66 year old female with a history of coronary artery disease. She status post anterior wall myocardial infarction. She has congestive heart failure with an ejection fraction of around 35%.  She's done very well since I last saw her. He is exercising on a fairly regular basis. She's not had any episodes of chest or shortness breath. She has done well. - walks every day. No chest pain or dyspnea.   She has not been as careful with her diet recently. She admits that she needs to work on a good low-fat, low carbohydrate diet.  April 15, 2013:  [name] is doing well. No CP or dyspnea. Still walks every day ( 3/4 of a mile). She is due to have an echo.   Dec. 2, 2014:  [name] is doing well. No CP or dyspnea. Still walking (3 laps around the mall Or 30 minutes) Every other day. Echocardiogram in June, 2014 sutures a persistently reduced left ventricular systolic function with an EF of around 30%. She has mild pulmonary hypertension with estimated PA pressure of 40. Left ventricle: Akinesis septum. Severe hypokinesis of the apex. The cavity size was normal. Wall thickness was increased in a pattern of mild LVH. The estimated ejection fraction was 30%. Findings consistent with left ventricular diastolic dysfunction. - Mitral valve: Mild  regurgitation. - Left atrium: The atrium was mildly dilated. - Pulmonary arteries: PA peak pressure: 40mm Hg    April 22, 2014:  [name] feels nervous today. HR and BP are up. She denies any chest pain or shortness of breath. She has had atrial fibrillation before. EKG today reveals atrial fibrillation with a ventricular rate of 136.  July 01, 2014:  [name] is doing . She's not having episodes of chest pain or shortness of breath. She's able to get out and exercise on a fairly regular basis.    January 27, 2015:   [name] is a 66 y.o. female who presents for her CAD. Doing  Well.  Exercising regulalry.   No CP with exercise.  Has lost some weight.   20 labs.  Checks her BP regularly  - its ok at home.  Watches her salt  Sept. 14, 2016: Doing well.   No CP , no dyspnea.  Exercising regularly, watching her diet Her husband died of a cardiac arrest on May 12.  She is still upset   February 02, 2016:  Walking every day  No CP .  Has not been able to lose weight .   Sept. 20, 2017 Feeling well.  No CP or dyspnea Getting some exercise   March 21,2018:    Doing well.  No CP , feeling well  Is working out at [employer] gym now.  No CP or dyspnea  Oct. 23, 2018:  No CP  or dyspnea  No palpitations Feeling well No syncope or presyncope Working out regularly     Past Medical History:  Diagnosis Date  . CHF (congestive heart failure) (HCC)    EF 30-35% BY ECHO. SHE HAS AN APICAL ANEURYSM  . Coronary artery disease 12/12/2008   STATUS POST ANTERIOR WALL MYOCARDIAL INFARCTION. STATUS POST PTCA AND STENTING OF HER LAD  . Dyslipidemia   . Transient atrial fibrillation or flutter     Past Surgical History:  Procedure Laterality Date  . CARDIAC DEFIBRILLATOR PLACEMENT       Current Outpatient Prescriptions  Medication Sig Dispense Refill  . amiodarone (PACERONE) 200 MG tablet Take one tablet by mouth two times per week on Mondays and Thursdays 30 tablet 3  . aspirin 81  MG tablet Take 81 mg by mouth daily.      Marland Kitchen atorvastatin (LIPITOR) 80 MG tablet Take 1 tablet (80 mg total) by mouth daily. 90 tablet 3  . carvedilol (COREG) 25 MG tablet TAKE 1 TABLET BY MOUTH TWICE DAILY WITH MEALS 180 tablet 0  . clopidogrel (PLAVIX) 75 MG tablet Take 1 tablet (75 mg total) by mouth daily. 90 tablet 0  . furosemide (LASIX) 40 MG tablet TAKE ONE TABLET BY MOUTH ONCE DAILY 90 tablet 3  . lisinopril (PRINIVIL,ZESTRIL) 10 MG tablet TAKE 1 TABLET BY MOUTH ONCE DAILY 90 tablet 0  . potassium chloride (KLOR-CON M10) 10 MEQ tablet Take 1 tablet (10 mEq total) by mouth daily. 90 tablet 0  . spironolactone (ALDACTONE) 25 MG tablet TAKE 1 TABLET BY MOUTH ONCE DAILY 90 tablet 0  . warfarin (COUMADIN) 5 MG tablet Take as directed by coumadin ciinic 100 tablet 0  . warfarin (COUMADIN) 5 MG tablet USE AS DIRECTED BY  COUMADIN  CLINIC 90 tablet 1   No current facility-administered medications for this visit.     Allergies:   Patient has no known allergies.    Social History:  The patient  reports that she quit smoking about 8 years ago. She has never used smokeless tobacco. She reports that she does not drink alcohol or use drugs.   Family History:  The patient's family history includes Heart disease in her brother and sister; Hypertension in her mother.    ROS:  Please see the history of present illness.    Review of Systems: Constitutional:  denies fever, chills, diaphoresis, appetite change and fatigue.  HEENT: denies photophobia, eye pain, redness, hearing loss, ear pain, congestion, sore throat, rhinorrhea, sneezing, neck pain, neck stiffness and tinnitus.  Respiratory: denies SOB, DOE, cough, chest tightness, and wheezing.  Cardiovascular: denies chest pain, palpitations and leg swelling.  Gastrointestinal: denies nausea, vomiting, abdominal pain, diarrhea, constipation, blood in stool.  Genitourinary: denies dysuria, urgency, frequency, hematuria, flank pain and difficulty  urinating.  Musculoskeletal: denies  myalgias, back pain, joint swelling, arthralgias and gait problem.   Skin: denies pallor, rash and wound.  Neurological: denies dizziness, seizures, syncope, weakness, light-headedness, numbness and headaches.   Hematological: denies adenopathy, easy bruising, personal or family bleeding history.  Psychiatric/ Behavioral: denies suicidal ideation, mood changes, confusion, nervousness, sleep disturbance and agitation.       All other systems are reviewed and negative.   Physical Exam: Blood pressure 126/68, pulse 72, height 5\' 11"  (1.803 m), weight 231 lb 12.8 oz (105.1 kg), SpO2 98 %.  GEN:  Well nourished, well developed in no acute distress HEENT: Normal NECK: No JVD; No carotid bruits LYMPHATICS: No lymphadenopathy CARDIAC: RR,  Soft systolic murmur, rubs, gallops RESPIRATORY:  Clear to auscultation without rales, wheezing or rhonchi  ABDOMEN: Soft, non-tender, non-distended MUSCULOSKELETAL:  No edema; No deformity  SKIN: Warm and dry NEUROLOGIC:  Alert and oriented x 3   EKG:    Recent Labs: 05/02/2017: ALT 22; BUN 16; Creatinine, Ser 0.92; Potassium 4.3; Sodium 142    Lipid Panel    Component Value Date/Time   CHOL 142 05/02/2017 0736   TRIG 95 05/02/2017 0736   HDL 50 05/02/2017 0736   CHOLHDL 2.8 05/02/2017 0736   CHOLHDL 2.4 05/10/2016 0743   VLDL 18 05/10/2016 0743   LDLCALC 73 05/02/2017 0736      Wt Readings from Last 3 Encounters:  09/04/17 231 lb 12.8 oz (105.1 kg)  01/31/17 230 lb 12.8 oz (104.7 kg)  08/30/16 235 lb (106.6 kg)      Other studies Reviewed: Additional studies/ records that were reviewed today include: . Review of the above records demonstrates:    ASSESSMENT AND PLAN:  1. Coronary artery disease-  No angina , continue curent meds.   Check lipids in 6 months   2. Congestive heart failure :  Doing well. No dyspnea. Will update echo   3. Status post AICD placement -  Managed by Dr. Ladona Ridgel     4. Paroxysmal atrial fibrillation -  Well controlled on Amio 200 mg twice a week  Check TSH in 6 months   5. Dyslipidemia -   Will check lipids and complete metabolic profile along with TSH in 6 months.    Current medicines are reviewed at length with the patient today.  The patient does not have concerns regarding medicines.  The following changes have been made:  no change  Labs/ tests ordered today include:   No orders of the defined types were placed in this encounter.    Disposition:   FU with me in 6 months  , fasting labs , TSH    Signed, Kristeen Miss, MD  09/04/2017 10:02 AM    [facility] [address] [place], [place], [place]  [address] Phone: [phone]; Fax: [phone]

## 2017-09-04 NOTE — Patient Instructions (Signed)
Medication Instructions:  Your physician recommends that you continue on your current medications as directed. Please refer to the Current Medication list given to you today.   Labwork: Your physician recommends that you return for lab work in: 6 months on the day of or a few days before your office visit with Dr. Nahser.  You will need to FAST for this appointment - nothing to eat or drink after midnight the night before except water.    Testing/Procedures: Your physician has requested that you have an echocardiogram. Echocardiography is a painless test that uses sound waves to create images of your heart. It provides your doctor with information about the size and shape of your heart and how well your heart's chambers and valves are working. This procedure takes approximately one hour. There are no restrictions for this procedure.    Follow-Up: Your physician wants you to follow-up in: 6 months with Dr. Nahser.  You will receive a reminder letter in the mail two months in advance. If you don't receive a letter, please call our office to schedule the follow-up appointment.   If you need a refill on your cardiac medications before your next appointment, please call your pharmacy.   Thank you for choosing [facility]! Layci Stenglein, RN [phone]    

## 2017-09-05 ENCOUNTER — Encounter: Payer: Self-pay | Admitting: Internal Medicine

## 2017-09-05 ENCOUNTER — Ambulatory Visit (INDEPENDENT_AMBULATORY_CARE_PROVIDER_SITE_OTHER): Payer: Medicare Other | Admitting: Internal Medicine

## 2017-09-05 ENCOUNTER — Other Ambulatory Visit: Payer: Self-pay

## 2017-09-05 ENCOUNTER — Ambulatory Visit (HOSPITAL_COMMUNITY): Payer: Medicare Other | Attending: Cardiovascular Disease

## 2017-09-05 VITALS — BP 140/88 | HR 72 | Ht 71.0 in | Wt 234.0 lb

## 2017-09-05 DIAGNOSIS — I480 Paroxysmal atrial fibrillation: Secondary | ICD-10-CM | POA: Diagnosis not present

## 2017-09-05 DIAGNOSIS — E785 Hyperlipidemia, unspecified: Secondary | ICD-10-CM | POA: Insufficient documentation

## 2017-09-05 DIAGNOSIS — I509 Heart failure, unspecified: Secondary | ICD-10-CM | POA: Insufficient documentation

## 2017-09-05 DIAGNOSIS — I4891 Unspecified atrial fibrillation: Secondary | ICD-10-CM | POA: Insufficient documentation

## 2017-09-05 DIAGNOSIS — I5022 Chronic systolic (congestive) heart failure: Secondary | ICD-10-CM | POA: Insufficient documentation

## 2017-09-05 DIAGNOSIS — Z95810 Presence of automatic (implantable) cardiac defibrillator: Secondary | ICD-10-CM

## 2017-09-05 DIAGNOSIS — I2510 Atherosclerotic heart disease of native coronary artery without angina pectoris: Secondary | ICD-10-CM | POA: Insufficient documentation

## 2017-09-05 DIAGNOSIS — I340 Nonrheumatic mitral (valve) insufficiency: Secondary | ICD-10-CM | POA: Insufficient documentation

## 2017-09-05 DIAGNOSIS — I255 Ischemic cardiomyopathy: Secondary | ICD-10-CM

## 2017-09-05 DIAGNOSIS — I252 Old myocardial infarction: Secondary | ICD-10-CM | POA: Diagnosis not present

## 2017-09-05 LAB — ECHOCARDIOGRAM COMPLETE
Height: 71 in
Weight: 3744 [oz_av]

## 2017-09-05 NOTE — Patient Instructions (Addendum)
Medication Instructions:  Your physician recommends that you continue on your current medications as directed. Please refer to the Current Medication list given to you today.  Labwork: None ordered.  Testing/Procedures: None ordered.  Follow-Up:  You will follow up with device clinic in 6 months for a device check (Pt does not do remote checks).  Your physician wants you to follow-up in: one year with Dr. Ladona Ridgel.   You will receive a reminder letter in the mail two months in advance. If you don't receive a letter, please call our office to schedule the follow-up appointment.   Any Other Special Instructions Will Be Listed Below (If Applicable).   If you need a refill on your cardiac medications before your next appointment, please call your pharmacy.

## 2017-09-05 NOTE — Progress Notes (Signed)
HPI [name] returns today for followup. She is a pleasant 66 yo woman with an ICM, chronic systolic heart failure and PAF. She has done well in the interim. She denies chest pain or sob. She admits to not exercising. No edema.  No Known Allergies   Current Outpatient Prescriptions  Medication Sig Dispense Refill  . amiodarone (PACERONE) 200 MG tablet Take one tablet by mouth two times per week on Mondays and Thursdays 30 tablet 3  . aspirin 81 MG tablet Take 81 mg by mouth daily.      Marland Kitchen. atorvastatin (LIPITOR) 80 MG tablet Take 1 tablet (80 mg total) by mouth daily. 90 tablet 3  . carvedilol (COREG) 25 MG tablet TAKE 1 TABLET BY MOUTH TWICE DAILY WITH MEALS 180 tablet 0  . clopidogrel (PLAVIX) 75 MG tablet Take 1 tablet (75 mg total) by mouth daily. 90 tablet 0  . furosemide (LASIX) 40 MG tablet TAKE ONE TABLET BY MOUTH ONCE DAILY 90 tablet 3  . lisinopril (PRINIVIL,ZESTRIL) 10 MG tablet TAKE 1 TABLET BY MOUTH ONCE DAILY 90 tablet 0  . potassium chloride (KLOR-CON M10) 10 MEQ tablet Take 1 tablet (10 mEq total) by mouth daily. 90 tablet 0  . spironolactone (ALDACTONE) 25 MG tablet TAKE 1 TABLET BY MOUTH ONCE DAILY 90 tablet 0  . warfarin (COUMADIN) 5 MG tablet Take as directed by coumadin ciinic 100 tablet 0   No current facility-administered medications for this visit.      Past Medical History:  Diagnosis Date  . CHF (congestive heart failure) (HCC)    EF 30-35% BY ECHO. SHE HAS AN APICAL ANEURYSM  . Coronary artery disease 12/12/2008   STATUS POST ANTERIOR WALL MYOCARDIAL INFARCTION. STATUS POST PTCA AND STENTING OF HER LAD  . Dyslipidemia   . Transient atrial fibrillation or flutter     ROS:   All systems reviewed and negative except as noted in the HPI.   Past Surgical History:  Procedure Laterality Date  . CARDIAC DEFIBRILLATOR PLACEMENT       Family History  Problem Relation Age of Onset  . Hypertension Mother   . Heart disease Sister   . Heart  disease Brother   . Breast cancer Neg Hx      Social History   Social History  . Marital status: Married    Spouse name: N/A  . Number of children: N/A  . Years of education: N/A   Occupational History  . Not on file.   Social History Main Topics  . Smoking status: Former Smoker    Quit date: 12/14/2008  . Smokeless tobacco: Never Used  . Alcohol use No  . Drug use: No  . Sexual activity: Not on file   Other Topics Concern  . Not on file   Social History Narrative  . No narrative on file     BP 140/88   Pulse 72   Ht 5\' 11"  (1.803 m)   Wt 234 lb (106.1 kg)   BMI 32.64 kg/m   Physical Exam:  Well appearing 66 yo woman NAD HEENT: Unremarkable Neck:  6 cm JVD, no thyromegally Lymphatics:  No adenopathy Back:  No CVA tenderness Lungs:  Clear with no wheezes HEART:  Regular rate rhythm, no murmurs, no rubs, no clicks Abd:  soft, positive bowel sounds, no organomegally, no rebound, no guarding Ext:  2 plus pulses, no edema, no cyanosis, no clubbing Skin:  No rashes no nodules Neuro:  CN II  through XII intact, motor grossly intact   DEVICE  Normal device function.  See PaceArt for details.   Assess/Plan: 1. ICD - her Medtronic single chamber ICD is working normally. She is over a year from ERI. 2. PAF - she is asymptomatic and tolerating amiodarone and warfarin 3. ICM - she denies anginal symptoms. She is still taking ASA and plavix and I wonder if we could stop one of these. I will defer to Dr. Elease Hashimoto. 4. Obesity - I asked the patient lose weight. She states she has started back exercising. 5. HTN - her blood pressure was good yesterday. I have asked her to reduce her sodium intake and lose weight.  Leonia Reeves.D.

## 2017-09-06 ENCOUNTER — Telehealth: Payer: Self-pay | Admitting: Nurse Practitioner

## 2017-09-06 NOTE — Telephone Encounter (Signed)
-----   Message from Vesta Mixer, MD sent at 09/06/2017 11:11 AM EDT ----- LV function has improved since previous echo in 2014

## 2017-09-06 NOTE — Telephone Encounter (Signed)
Echo results reviewed with patient who verbalized understanding and thanked me for the call.

## 2017-09-27 LAB — CUP PACEART INCLINIC DEVICE CHECK
Battery Voltage: 2.83 V
Brady Statistic RV Percent Paced: 0 %
HIGH POWER IMPEDANCE MEASURED VALUE: 49 Ohm
HIGH POWER IMPEDANCE MEASURED VALUE: 54 Ohm
Lead Channel Impedance Value: 494 Ohm
Lead Channel Sensing Intrinsic Amplitude: 10.75 mV
Lead Channel Setting Pacing Pulse Width: 0.4 ms
Lead Channel Setting Sensing Sensitivity: 0.3 mV
MDC IDC LEAD IMPLANT DT: 20110627
MDC IDC LEAD LOCATION: 753860
MDC IDC MSMT LEADCHNL RV PACING THRESHOLD AMPLITUDE: 0.75 V
MDC IDC MSMT LEADCHNL RV PACING THRESHOLD PULSEWIDTH: 0.4 ms
MDC IDC PG IMPLANT DT: 20110627
MDC IDC SESS DTM: 20181024124705
MDC IDC SET LEADCHNL RV PACING AMPLITUDE: 2.5 V

## 2017-09-29 ENCOUNTER — Other Ambulatory Visit: Payer: Self-pay | Admitting: Cardiovascular Disease

## 2017-10-03 ENCOUNTER — Telehealth: Payer: Self-pay

## 2017-10-03 NOTE — Telephone Encounter (Signed)
Attempted to call [facility] pharmacy to clarify that Dr. Elease Hashimoto is aware of patient's medication therapy and it is okay to refill; there was no answer and no option to leave a message.

## 2017-10-03 NOTE — Telephone Encounter (Signed)
Patient called in questioning why her medication was not being refilled.  I call pharmacy who stated that there was a drug interaction between lisinopril,spironolactone, and potassium. They need confirmation that the MD is aware of this before they can refill those medication. Please advise.

## 2017-10-03 NOTE — Telephone Encounter (Signed)
Spoke with [facility] pharmacist and advised that Dr. Elease Hashimoto is aware of the potential effects of lisinopril, spironolactone, and kdur and he monitors her electrolytes. She thanked me for the call.

## 2017-10-10 ENCOUNTER — Ambulatory Visit (INDEPENDENT_AMBULATORY_CARE_PROVIDER_SITE_OTHER): Payer: Medicare Other | Admitting: *Deleted

## 2017-10-10 DIAGNOSIS — I4891 Unspecified atrial fibrillation: Secondary | ICD-10-CM

## 2017-10-10 DIAGNOSIS — I480 Paroxysmal atrial fibrillation: Secondary | ICD-10-CM | POA: Diagnosis not present

## 2017-10-10 DIAGNOSIS — Z5181 Encounter for therapeutic drug level monitoring: Secondary | ICD-10-CM | POA: Diagnosis not present

## 2017-10-10 LAB — POCT INR: INR: 2.1

## 2017-10-10 NOTE — Patient Instructions (Signed)
Continue taking 1 tablet daily except 1/2 tablet on Tuesdays, Thursdays, and Saturdays.  Recheck INR in 6 weeks

## 2017-10-22 ENCOUNTER — Other Ambulatory Visit: Payer: Self-pay | Admitting: Cardiovascular Disease

## 2017-10-24 ENCOUNTER — Other Ambulatory Visit: Payer: Self-pay

## 2017-10-24 MED ORDER — POTASSIUM CHLORIDE CRYS ER 10 MEQ PO TBCR: 10.0000 meq | EXTENDED_RELEASE_TABLET | Freq: Every day | ORAL | 3 refills | Status: DC

## 2017-10-24 MED ORDER — CLOPIDOGREL BISULFATE 75 MG PO TABS: 75.0000 mg | ORAL_TABLET | Freq: Every day | ORAL | 3 refills | Status: DC

## 2017-11-02 ENCOUNTER — Ambulatory Visit
Admission: RE | Admit: 2017-11-02 | Discharge: 2017-11-02 | Disposition: A | Discharge status: Home / Self Care | Payer: Medicare Other | Source: Ambulatory Visit | Attending: Internal Medicine | Admitting: Internal Medicine

## 2017-11-02 DIAGNOSIS — Z1231 Encounter for screening mammogram for malignant neoplasm of breast: Secondary | ICD-10-CM | POA: Insufficient documentation

## 2017-11-19 ENCOUNTER — Telehealth: Payer: Self-pay | Admitting: Cardiovascular Disease

## 2017-11-19 NOTE — Telephone Encounter (Signed)
Left detailed message on patient's personal voice mail that she can permanently d/c Aspirin. I advised her that Dr. Wilfred Lacy office will call her with instructions regarding her tooth extraction. I advised her to call back with questions or concerns.

## 2017-11-19 NOTE — Telephone Encounter (Signed)
She may DC ASA OK to hold Plavix for 5 days prior to procedure  Some dentist will pull teeth on coumadin.   She will need to check with her dentist about that .  She will need to go to coumadin clinic to have INR checked prior to dental work

## 2017-11-19 NOTE — Telephone Encounter (Signed)
New Message     [facility] Pre-operative Risk Assessment    Request for surgical clearance:  1. What type of surgery is being performed? Tooth extraction   When is this surgery scheduled? No date  2. Are there any medications that need to be held prior to surgery and how long? Plavix, Coumadin, Asprin. Duration TBD  3. Practice name and name of physician performing surgery?  [facility]: Tito Dine  What is your office phone and fax number? [phone] fax [phone]  4. Anesthesia type (None, local, MAC, general) ? local   [name] 11/19/2017, 8:54 AM  _________________________________________________________________   (provider comments below)

## 2017-11-19 NOTE — Telephone Encounter (Signed)
Spoke with Dr. Wilfred Lacy office who is extracting 1 tooth. Generally would not hold coumadin for 1 tooth extraction, but office is requesting to hold to avoid sending to oral surgeon.   Patient is taking warfarin for Afib with CHADS2-VASc score of  5 (CHF, HTN, AGE, DM2, stroke/tia x 2, CAD, AGE, female)  Per office protocol, patient can hold warfarin for 5 days prior to procedure if needed.   Information faxed to Dr. Wilfred Lacy office as requested.

## 2017-11-19 NOTE — Addendum Note (Signed)
Addended by: Levi Aland on: 11/19/2017 01:27 PM   Modules accepted: Orders

## 2017-11-21 ENCOUNTER — Ambulatory Visit (INDEPENDENT_AMBULATORY_CARE_PROVIDER_SITE_OTHER): Payer: Medicare Other

## 2017-11-21 DIAGNOSIS — I480 Paroxysmal atrial fibrillation: Secondary | ICD-10-CM | POA: Diagnosis not present

## 2017-11-21 DIAGNOSIS — Z5181 Encounter for therapeutic drug level monitoring: Secondary | ICD-10-CM

## 2017-11-21 DIAGNOSIS — I4891 Unspecified atrial fibrillation: Secondary | ICD-10-CM | POA: Diagnosis not present

## 2017-11-21 LAB — POCT INR: INR: 2.3

## 2017-11-21 NOTE — Patient Instructions (Signed)
Description   Continue taking 1 tablet daily except 1/2 tablet on Tuesdays,  Thursdays, and Saturdays. Recheck INR in 6 weeks.      

## 2017-12-03 ENCOUNTER — Other Ambulatory Visit: Payer: Self-pay | Admitting: Cardiovascular Disease

## 2017-12-04 ENCOUNTER — Other Ambulatory Visit: Payer: Self-pay | Admitting: *Deleted

## 2018-01-02 ENCOUNTER — Ambulatory Visit (INDEPENDENT_AMBULATORY_CARE_PROVIDER_SITE_OTHER): Payer: Medicare Other | Admitting: *Deleted

## 2018-01-02 DIAGNOSIS — Z5181 Encounter for therapeutic drug level monitoring: Secondary | ICD-10-CM | POA: Diagnosis not present

## 2018-01-02 DIAGNOSIS — I4891 Unspecified atrial fibrillation: Secondary | ICD-10-CM

## 2018-01-02 DIAGNOSIS — I480 Paroxysmal atrial fibrillation: Secondary | ICD-10-CM | POA: Diagnosis not present

## 2018-01-02 LAB — POCT INR: INR: 2.7

## 2018-01-02 NOTE — Patient Instructions (Signed)
Description   Continue taking 1 tablet daily except 1/2 tablet on Tuesdays,  Thursdays, and Saturdays. Recheck INR in 6 weeks.      

## 2018-02-08 ENCOUNTER — Other Ambulatory Visit: Payer: Self-pay | Admitting: Cardiovascular Disease

## 2018-02-08 DIAGNOSIS — I2510 Atherosclerotic heart disease of native coronary artery without angina pectoris: Secondary | ICD-10-CM

## 2018-02-08 DIAGNOSIS — I2583 Coronary atherosclerosis due to lipid rich plaque: Principal | ICD-10-CM

## 2018-02-13 ENCOUNTER — Ambulatory Visit (INDEPENDENT_AMBULATORY_CARE_PROVIDER_SITE_OTHER): Payer: Medicare Other | Admitting: *Deleted

## 2018-02-13 DIAGNOSIS — Z5181 Encounter for therapeutic drug level monitoring: Secondary | ICD-10-CM

## 2018-02-13 DIAGNOSIS — I4891 Unspecified atrial fibrillation: Secondary | ICD-10-CM | POA: Diagnosis not present

## 2018-02-13 DIAGNOSIS — I480 Paroxysmal atrial fibrillation: Secondary | ICD-10-CM

## 2018-02-13 LAB — POCT INR: INR: 3.1

## 2018-02-13 NOTE — Patient Instructions (Signed)
Description   Today take 1/2 tablet, Eat a serving of leafy green today, then Continue taking 1 tablet daily except 1/2 tablet on Tuesdays, Thursdays, and Saturdays.  Recheck INR in 4 weeks With Dr Elease Hashimoto appt.

## 2018-03-04 ENCOUNTER — Ambulatory Visit (INDEPENDENT_AMBULATORY_CARE_PROVIDER_SITE_OTHER): Payer: Medicare Other | Admitting: *Deleted

## 2018-03-04 DIAGNOSIS — I5022 Chronic systolic (congestive) heart failure: Secondary | ICD-10-CM

## 2018-03-04 NOTE — Progress Notes (Addendum)
ICD check in clinic. Normal device function. Threshold and sensing consistent with previous device measurements. Impedance trend stable over time. No evidence of any ventricular arrhythmias. Histogram distribution appropriate for patient and level of activity. No changes made this session. Thoracic impedance trends elevated. Patient denies new or increased ShOB or swelling in lower extremities. Patient does not weigh daily. Device programmed at appropriate safety margins. Device programmed to optimize intrinsic conduction. Battery voltage 2.72V (RRT 2.63V). Patient declines remote checks and declines q33mo visits. ROV with GT in 50mo.

## 2018-03-12 ENCOUNTER — Encounter: Payer: Self-pay | Admitting: Cardiovascular Disease

## 2018-03-12 ENCOUNTER — Ambulatory Visit (INDEPENDENT_AMBULATORY_CARE_PROVIDER_SITE_OTHER): Payer: Medicare Other | Admitting: Pharmacist

## 2018-03-12 ENCOUNTER — Ambulatory Visit (INDEPENDENT_AMBULATORY_CARE_PROVIDER_SITE_OTHER): Payer: Medicare Other | Admitting: Cardiovascular Disease

## 2018-03-12 VITALS — BP 146/88 | HR 80 | Ht 71.0 in | Wt 228.0 lb

## 2018-03-12 DIAGNOSIS — E782 Mixed hyperlipidemia: Secondary | ICD-10-CM

## 2018-03-12 DIAGNOSIS — I5042 Chronic combined systolic (congestive) and diastolic (congestive) heart failure: Secondary | ICD-10-CM | POA: Diagnosis not present

## 2018-03-12 DIAGNOSIS — I2510 Atherosclerotic heart disease of native coronary artery without angina pectoris: Secondary | ICD-10-CM | POA: Diagnosis not present

## 2018-03-12 DIAGNOSIS — I480 Paroxysmal atrial fibrillation: Secondary | ICD-10-CM

## 2018-03-12 DIAGNOSIS — Z5181 Encounter for therapeutic drug level monitoring: Secondary | ICD-10-CM

## 2018-03-12 LAB — POCT INR: INR: 2.4

## 2018-03-12 NOTE — Progress Notes (Signed)
Cardiology Office Note   Date:  03/12/2018   ID:  [name], DOB 12-02-1950, MRN [id number]  PCP:  Burman Freestone PA-C  Cardiologist:   Kristeen Miss, MD   Chief Complaint  Patient presents with  . Congestive Heart Failure  . Coronary Artery Disease   1. Coronary artery disease-status post anterior wall myocardial infarction. She status post stenting of her LAD in her Jan, 2010. We placed a 3.0 x 18 mm vision stent. Was post dilated with a 3.25 mm [place] 2. Chronic combined systolic and diastolic congestive heart failure: with EF of 30-35% associated with an apical aneurysm.   EF has improved to 45-50%.  3. Status post AICD placement  4. Paroxysmal atrial fibrillation 5. Dyslipidemia 6.   Raneshia is a 67 year old female with a history of coronary artery disease. She status post anterior wall myocardial infarction. She has congestive heart failure with an ejection fraction of around 35%.  She's done very well since I last saw her. He is exercising on a fairly regular basis. She's not had any episodes of chest or shortness breath. She has done well. - walks every day. No chest pain or dyspnea.   She has not been as careful with her diet recently. She admits that she needs to work on a good low-fat, low carbohydrate diet.  April 15, 2013:  [name] is doing well. No CP or dyspnea. Still walks every day ( 3/4 of a mile). She is due to have an echo.   Dec. 2, 2014:  [name] is doing well. No CP or dyspnea. Still walking (3 laps around the mall Or 30 minutes) Every other day. Echocardiogram in June, 2014 sutures a persistently reduced left ventricular systolic function with an EF of around 30%. She has mild pulmonary hypertension with estimated PA pressure of 40. Left ventricle: Akinesis septum. Severe hypokinesis of the apex. The cavity size was normal. Wall thickness was increased in a pattern of mild LVH. The estimated ejection fraction was 30%. Findings  consistent with left ventricular diastolic dysfunction. - Mitral valve: Mild regurgitation. - Left atrium: The atrium was mildly dilated. - Pulmonary arteries: PA peak pressure: 40mm Hg    April 22, 2014:  [name] feels nervous today. HR and BP are up. She denies any chest pain or shortness of breath. She has had atrial fibrillation before. EKG today reveals atrial fibrillation with a ventricular rate of 136.  July 01, 2014:  [name] is doing . She's not having episodes of chest pain or shortness of breath. She's able to get out and exercise on a fairly regular basis.    January 27, 2015:   [name] is a 67 y.o. female who presents for her CAD. Doing  Well.  Exercising regulalry.   No CP with exercise.  Has lost some weight.   20 labs.  Checks her BP regularly  - its ok at home.  Watches her salt  Sept. 14, 2016: Doing well.   No CP , no dyspnea.  Exercising regularly, watching her diet Her husband died of a cardiac arrest on Apr 03, 2023.  She is still upset   February 02, 2016:  Walking every day  No CP .  Has not been able to lose weight .   Sept. 20, 2017 Feeling well.  No CP or dyspnea Getting some exercise   March 21,2018:    Doing well.  No CP , feeling well  Is working out at [employer] now.  No CP or dyspnea  Oct. 23, 2018:  No CP or dyspnea  No palpitations Feeling well No syncope or presyncope Working out regularly   March 12, 2018:  [name] is seen today for follow-up visit.  She has a history of coronary artery disease and congestive heart failure.  She also has paroxysmal atrial fibrillation.  She has a history of hyperlipidemia. Her sister diet recently at age 29.  No CP or dyspnea  Getting some exercise   BP has been up related to stress of sisters death     Past Medical History:  Diagnosis Date  . CHF (congestive heart failure) (HCC)    EF 30-35% BY ECHO. SHE HAS AN APICAL ANEURYSM  . Coronary artery disease 12/12/2008   STATUS POST ANTERIOR  WALL MYOCARDIAL INFARCTION. STATUS POST PTCA AND STENTING OF HER LAD  . Dyslipidemia   . Transient atrial fibrillation or flutter     Past Surgical History:  Procedure Laterality Date  . CARDIAC DEFIBRILLATOR PLACEMENT       Current Outpatient Medications  Medication Sig Dispense Refill  . amiodarone (PACERONE) 200 MG tablet TAKE ONE TABLET BY MOUTH 3 TIMES A WEEK, TAKE ON MONDAY,WEDNESDAY AND FRIDAY 90 tablet 2  . atorvastatin (LIPITOR) 80 MG tablet TAKE 1 TABLET BY MOUTH ONCE DAILY 90 tablet 2  . carvedilol (COREG) 25 MG tablet TAKE 1 TABLET BY MOUTH TWICE DAILY WITH MEALS 180 tablet 3  . clopidogrel (PLAVIX) 75 MG tablet Take 1 tablet (75 mg total) by mouth daily. 90 tablet 3  . furosemide (LASIX) 40 MG tablet TAKE 1 TABLET BY MOUTH ONCE DAILY 90 tablet 2  . lisinopril (PRINIVIL,ZESTRIL) 10 MG tablet TAKE 1 TABLET BY MOUTH ONCE DAILY 90 tablet 3  . potassium chloride (KLOR-CON M10) 10 MEQ tablet Take 1 tablet (10 mEq total) by mouth daily. 90 tablet 3  . spironolactone (ALDACTONE) 25 MG tablet TAKE 1 TABLET BY MOUTH ONCE DAILY 90 tablet 3  . warfarin (COUMADIN) 5 MG tablet USE AS DIRECTED BY COUMADIN CLINIC 90 tablet 1   No current facility-administered medications for this visit.     Allergies:   Patient has no known allergies.    Social History:  The patient  reports that she quit smoking about 9 years ago. She has never used smokeless tobacco. She reports that she does not drink alcohol or use drugs.   Family History:  The patient's family history includes Heart disease in her brother and sister; Hypertension in her mother.    ROS:  Noted in current history.  Otherwise review of systems is negative  Physical Exam: Blood pressure (!) 146/88, pulse 80, height 5\' 11"  (1.803 m), weight 228 lb (103.4 kg), SpO2 99 %.  GEN:  Well nourished, well developed in no acute distress HEENT: Normal NECK: No JVD; No carotid bruits LYMPHATICS: No lymphadenopathy CARDIAC: RRR no  murmurs, rubs, gallops RESPIRATORY:  Clear to auscultation without rales, wheezing or rhonchi  ABDOMEN: Soft, non-tender, non-distended MUSCULOSKELETAL:  No edema; No deformity  SKIN: Warm and dry NEUROLOGIC:  Alert and oriented x 3    EKG: March 12, 2018: Normal sinus rhythm at 80.  Previous septal myocardial infarction.   Recent Labs: 05/02/2017: ALT 22; BUN 16; Creatinine, Ser 0.92; Potassium 4.3; Sodium 142    Lipid Panel    Component Value Date/Time   CHOL 142 05/02/2017 0736   TRIG 95 05/02/2017 0736   HDL 50 05/02/2017 0736   CHOLHDL 2.8 05/02/2017 0736  CHOLHDL 2.4 05/10/2016 0743   VLDL 18 05/10/2016 0743   LDLCALC 73 05/02/2017 0736      Wt Readings from Last 3 Encounters:  03/12/18 228 lb (103.4 kg)  09/05/17 234 lb (106.1 kg)  09/04/17 231 lb 12.8 oz (105.1 kg)      Other studies Reviewed: Additional studies/ records that were reviewed today include: . Review of the above records demonstrates:    ASSESSMENT AND PLAN:  1. Coronary artery disease-   No angina   2. Congestive heart failure :    EF has improved.  Continue meds.   3. Status post AICD placement -  Managed by Dr. Ladona Ridgel   4. Paroxysmal atrial fibrillation -     5. Dyslipidemia -    Check labs today     Current medicines are reviewed at length with the patient today.  The patient does not have concerns regarding medicines.  The following changes have been made:  no change  Labs/ tests ordered today include:   No orders of the defined types were placed in this encounter.    Disposition:   FU with me in 6 months  , fasting labs , TSH    Signed, Kristeen Miss, MD  03/12/2018 2:48 PM    [facility] [address] [place], [place], [place]  [address] Phone: [phone]; Fax: [phone]

## 2018-03-12 NOTE — Patient Instructions (Signed)
Medication Instructions:  Your physician recommends that you continue on your current medications as directed. Please refer to the Current Medication list given to you today.   Labwork: TODAY - cholesterol, liver panel, basic metabolic panel   Testing/Procedures: None Ordered   Follow-Up: Your physician wants you to follow-up in: 6 months with a PA or Nurse Practitioner with Dr. Harvie Bridge team. You will receive a reminder letter in the mail two months in advance. If you don't receive a letter, please call our office to schedule the follow-up appointment.   If you need a refill on your cardiac medications before your next appointment, please call your pharmacy.   Thank you for choosing [facility]! Eligha Bridegroom, RN [phone]

## 2018-03-12 NOTE — Patient Instructions (Signed)
Description   Continue taking 1 tablet daily except 1/2 tablet on Tuesdays,  Thursdays, and Saturdays. Recheck INR in 6 weeks.      

## 2018-03-13 LAB — BASIC METABOLIC PANEL
BUN/Creatinine Ratio: 16 (ref 12–28)
BUN: 14 mg/dL (ref 8–27)
CALCIUM: 10 mg/dL (ref 8.7–10.3)
CHLORIDE: 98 mmol/L (ref 96–106)
CO2: 25 mmol/L (ref 20–29)
Creatinine, Ser: 0.89 mg/dL (ref 0.57–1.00)
GFR calc Af Amer: 78 mL/min/{1.73_m2} (ref >59)
GFR calc non Af Amer: 68 mL/min/{1.73_m2} (ref >59)
GLUCOSE: 97 mg/dL (ref 65–99)
Potassium: 4.3 mmol/L (ref 3.5–5.2)
Sodium: 141 mmol/L (ref 134–144)

## 2018-03-13 LAB — HEPATIC FUNCTION PANEL
ALBUMIN: 4.9 g/dL — AB (ref 3.6–4.8)
ALT: 20 IU/L (ref 0–32)
AST: 20 IU/L (ref 0–40)
Alkaline Phosphatase: 120 IU/L — ABNORMAL HIGH (ref 39–117)
BILIRUBIN, DIRECT: 0.19 mg/dL (ref 0.00–0.40)
Bilirubin Total: 0.7 mg/dL (ref 0.0–1.2)
TOTAL PROTEIN: 7.7 g/dL (ref 6.0–8.5)

## 2018-03-13 LAB — LIPID PANEL
CHOL/HDL RATIO: 3 ratio (ref 0.0–4.4)
Cholesterol, Total: 155 mg/dL (ref 100–199)
HDL: 51 mg/dL (ref >39)
LDL Calculated: 83 mg/dL (ref 0–99)
Triglycerides: 105 mg/dL (ref 0–149)
VLDL Cholesterol Cal: 21 mg/dL (ref 5–40)

## 2018-04-24 ENCOUNTER — Ambulatory Visit (INDEPENDENT_AMBULATORY_CARE_PROVIDER_SITE_OTHER): Payer: Medicare Other | Admitting: *Deleted

## 2018-04-24 DIAGNOSIS — Z5181 Encounter for therapeutic drug level monitoring: Secondary | ICD-10-CM

## 2018-04-24 DIAGNOSIS — I480 Paroxysmal atrial fibrillation: Secondary | ICD-10-CM | POA: Diagnosis not present

## 2018-04-24 LAB — POCT INR: INR: 3.1 — AB (ref 2.0–3.0)

## 2018-04-24 NOTE — Patient Instructions (Signed)
Description   Today take only take 1/2 tablet, eat serving of leafy green, then Continue taking 1 tablet daily except 1/2 tablet on Tuesdays, Thursdays, and Saturdays.  Recheck INR in 4 weeks.

## 2018-05-22 ENCOUNTER — Ambulatory Visit (INDEPENDENT_AMBULATORY_CARE_PROVIDER_SITE_OTHER): Payer: Medicare Other | Admitting: *Deleted

## 2018-05-22 DIAGNOSIS — I480 Paroxysmal atrial fibrillation: Secondary | ICD-10-CM | POA: Diagnosis not present

## 2018-05-22 DIAGNOSIS — Z5181 Encounter for therapeutic drug level monitoring: Secondary | ICD-10-CM | POA: Diagnosis not present

## 2018-05-22 LAB — POCT INR: INR: 2.2 (ref 2.0–3.0)

## 2018-05-22 NOTE — Patient Instructions (Addendum)
Description   Continue taking 1 tablet daily except 1/2 tablet on Tuesdays, Thursdays, and Saturdays.  Recheck INR in 5 weeks. Call us with any medication changes or concerns # [phone].

## 2018-06-26 ENCOUNTER — Ambulatory Visit (INDEPENDENT_AMBULATORY_CARE_PROVIDER_SITE_OTHER): Payer: Medicare Other | Admitting: *Deleted

## 2018-06-26 DIAGNOSIS — I480 Paroxysmal atrial fibrillation: Secondary | ICD-10-CM

## 2018-06-26 DIAGNOSIS — Z5181 Encounter for therapeutic drug level monitoring: Secondary | ICD-10-CM | POA: Diagnosis not present

## 2018-06-26 LAB — POCT INR: INR: 2.2 (ref 2.0–3.0)

## 2018-06-26 NOTE — Patient Instructions (Signed)
Description   Continue taking 1 tablet daily except 1/2 tablet on Tuesdays, Thursdays, and Saturdays.  Recheck INR in 6 weeks. Call us with any medication changes or concerns # [phone].      

## 2018-07-31 ENCOUNTER — Other Ambulatory Visit: Payer: Self-pay | Admitting: Cardiovascular Disease

## 2018-08-07 ENCOUNTER — Ambulatory Visit (INDEPENDENT_AMBULATORY_CARE_PROVIDER_SITE_OTHER): Payer: Medicare Other | Admitting: *Deleted

## 2018-08-07 DIAGNOSIS — Z5181 Encounter for therapeutic drug level monitoring: Secondary | ICD-10-CM | POA: Diagnosis not present

## 2018-08-07 DIAGNOSIS — I480 Paroxysmal atrial fibrillation: Secondary | ICD-10-CM

## 2018-08-07 LAB — POCT INR: INR: 2.8 (ref 2.0–3.0)

## 2018-08-07 NOTE — Patient Instructions (Signed)
Description   Continue taking 1 tablet daily except 1/2 tablet on Tuesdays, Thursdays, and Saturdays.  Recheck INR in 6 weeks. Call us with any medication changes or concerns # [phone].      

## 2018-09-11 ENCOUNTER — Ambulatory Visit (INDEPENDENT_AMBULATORY_CARE_PROVIDER_SITE_OTHER): Payer: Medicare Other | Admitting: Medical

## 2018-09-11 ENCOUNTER — Encounter: Payer: Self-pay | Admitting: Physician Assistant

## 2018-09-11 VITALS — BP 132/80 | HR 73 | Ht 71.0 in | Wt 230.8 lb

## 2018-09-11 DIAGNOSIS — E782 Mixed hyperlipidemia: Secondary | ICD-10-CM | POA: Diagnosis not present

## 2018-09-11 DIAGNOSIS — I480 Paroxysmal atrial fibrillation: Secondary | ICD-10-CM

## 2018-09-11 DIAGNOSIS — I10 Essential (primary) hypertension: Secondary | ICD-10-CM

## 2018-09-11 DIAGNOSIS — Z95810 Presence of automatic (implantable) cardiac defibrillator: Secondary | ICD-10-CM

## 2018-09-11 DIAGNOSIS — I5042 Chronic combined systolic (congestive) and diastolic (congestive) heart failure: Secondary | ICD-10-CM | POA: Diagnosis not present

## 2018-09-11 DIAGNOSIS — I2510 Atherosclerotic heart disease of native coronary artery without angina pectoris: Secondary | ICD-10-CM | POA: Diagnosis not present

## 2018-09-11 MED ORDER — LISINOPRIL 20 MG PO TABS: 20.0000 mg | ORAL_TABLET | Freq: Every day | ORAL | 3 refills | Status: DC

## 2018-09-11 NOTE — Progress Notes (Signed)
Cardiology Office Note   Date:  09/11/2018   ID:  [name], DOB 24-Oct-1951, MRN [id number]  PCP:  Burman Freestone PA-C  Cardiologist:  Kristeen Miss, MD EP: None  Chief Complaint  Patient presents with  . Follow-up    CAD      History of Present Illness: [name] is a 67 y.o. female with PMH of CAD s/p PCI/DES to LAD 11/2008, Chronic combined CHF, paroxysmal atrial fibrillation, HLD, s/p AICD for primary prevention, who presents for routine follow-up of her CAD.   She was last seen by Dr. Elease Hashimoto 02/2018 and was doing well at that time. No cardiac complaints. No medication changes occurred at that visit. She follows with Dr. Ladona Ridgel for her AICD which was functioning normally at her last visit 08/2017. She follows with the coumadin clinic for INR checks which have been within therapeutic range.   Today she continues to do well from a cardiac standpoint. She walks every day for exercise. No anginal complaints. No volume overload complaints. She is limiting her salt intake and working on dietary modifications to promote weight loss. She denies palpitations, dizziness, lightheadedness, or syncope. Denies hematochezia, melena, or hematuria. No issues with her ICD - she is scheduled to see Dr. Ladona Ridgel 09/17/18.    Past Medical History:  Diagnosis Date  . CHF (congestive heart failure) (HCC)    EF 30-35% BY ECHO. SHE HAS AN APICAL ANEURYSM  . Coronary artery disease 12/12/2008   STATUS POST ANTERIOR WALL MYOCARDIAL INFARCTION. STATUS POST PTCA AND STENTING OF HER LAD  . Dyslipidemia   . Transient atrial fibrillation or flutter     Past Surgical History:  Procedure Laterality Date  . CARDIAC DEFIBRILLATOR PLACEMENT       Current Outpatient Medications  Medication Sig Dispense Refill  . amiodarone (PACERONE) 200 MG tablet Take 200 mg by mouth 2 (two) times a week. Patient takes Amiodarone 200mg  2 timeS a week, take one on Monday and Thursday.    Marland Kitchen atorvastatin  (LIPITOR) 80 MG tablet TAKE 1 TABLET BY MOUTH ONCE DAILY 90 tablet 2  . carvedilol (COREG) 25 MG tablet TAKE 1 TABLET BY MOUTH TWICE DAILY WITH MEALS 180 tablet 3  . clopidogrel (PLAVIX) 75 MG tablet Take 1 tablet (75 mg total) by mouth daily. 90 tablet 3  . furosemide (LASIX) 40 MG tablet TAKE 1 TABLET BY MOUTH ONCE DAILY 90 tablet 2  . potassium chloride (KLOR-CON M10) 10 MEQ tablet Take 1 tablet (10 mEq total) by mouth daily. 90 tablet 3  . spironolactone (ALDACTONE) 25 MG tablet TAKE 1 TABLET BY MOUTH ONCE DAILY 90 tablet 3  . warfarin (COUMADIN) 5 MG tablet USE AS DIRECTED BY COUMADIN CLINIC 90 tablet 1  . lisinopril (PRINIVIL,ZESTRIL) 20 MG tablet Take 1 tablet (20 mg total) by mouth daily. 90 tablet 3   No current facility-administered medications for this visit.     Allergies:   Patient has no known allergies.    Social History:  The patient  reports that she quit smoking about 9 years ago. She has never used smokeless tobacco. She reports that she does not drink alcohol or use drugs.   Family History:  The patient's family history includes Heart disease in her brother and sister; Hypertension in her mother.    ROS:  Please see the history of present illness.   Otherwise, review of systems are positive for none.   All other systems are reviewed and negative.  PHYSICAL EXAM: VS:  BP 132/80   Pulse 73   Ht 5\' 11"  (1.803 m)   Wt 230 lb 12.8 oz (104.7 kg)   SpO2 97%   BMI 32.19 kg/m  , BMI Body mass index is 32.19 kg/m. GEN: Well nourished, well developed, obese female sitting on the exam table in no acute distress HEENT: sclera anicteric Neck: no JVD, carotid bruits, or masses Cardiac: RRR; no murmurs, rubs, or gallops,no edema  Respiratory:  clear to auscultation bilaterally, normal work of breathing GI: soft, nontender, nondistended, + BS MS: no deformity or atrophy Skin: warm and dry, no rash Neuro:  Strength and sensation are intact Psych: euthymic mood, full  affect   EKG:  EKG is not ordered today.   Recent Labs: 03/12/2018: ALT 20; BUN 14; Creatinine, Ser 0.89; Potassium 4.3; Sodium 141    Lipid Panel    Component Value Date/Time   CHOL 155 03/12/2018 1505   TRIG 105 03/12/2018 1505   HDL 51 03/12/2018 1505   CHOLHDL 3.0 03/12/2018 1505   CHOLHDL 2.4 05/10/2016 0743   VLDL 18 05/10/2016 0743   LDLCALC 83 03/12/2018 1505      Wt Readings from Last 3 Encounters:  09/11/18 230 lb 12.8 oz (104.7 kg)  03/12/18 228 lb (103.4 kg)  09/05/17 234 lb (106.1 kg)      Other studies Reviewed: Additional studies/ records that were reviewed today include:   Echocardiogram 08/2017: Study Conclusions  - Left ventricle: The cavity size was normal. Wall thickness was   normal. Systolic function was mildly reduced. The estimated   ejection fraction was in the range of 45% to 50%. Akinesis of the   anteroseptal myocardium. Features are consistent with a   pseudonormal left ventricular filling pattern, with concomitant   abnormal relaxation and increased filling pressure (grade 2   diastolic dysfunction). - Mitral valve: There was mild to moderate regurgitation. - Right atrium: The atrium was mildly dilated. - Pulmonary arteries: Systolic pressure was moderately increased.   PA peak pressure: 44 mm Hg (S).    ASSESSMENT AND PLAN:  1. CAD s/p PCI/DES to LAD in 2010: no anginal complaints. Not on aspirin due to anticoagulation needs with warfarin.  - Continue plavix and statin   2. Chronic combined CHF: EF improved to 45-50% on last echo 08/2017. No volume overload complaints and she appears euvolemic on exam.  - Continue carvvedilol, lisinopril, lasix, and spironolactone  - Encourage to continue limiting salt intake   3. HTN: BP 132/80 - has been above goal for the past several outpatient visits. - Will increase lisinopril to 20mg  daily - Continue carvedilol, spironolactone, and lasix  4. Paroxysmal atrial fibrillation: no  complaints of palpitations. No complaints of bleeding on coumadin. INRs have been within therapeutic range. - Continue carvedilol for rate control - Continue amiodarone for rhythm control - Continue coumadin for stroke ppx - INRs monitored by coumadin clinic (next appt 09/17/18)  5. S/p AICD: followed by Dr. Ladona Ridgel. No shocks reported.  - Continue routine monitoring per Dr. Ladona Ridgel - next appt 09/17/18.   6. HLD: LDL 83 on lipid panel 02/2018.  - Continue atorvastatin 80mg  daily and dietary modifications.    Current medicines are reviewed at length with the patient today.  The patient does not have concerns regarding medicines.  The following changes have been made:  Lisinopril increased to 20mg  daily  Labs/ tests ordered today include: None No orders of the defined types were placed in this encounter.  Disposition:   FU with Dr. Elease Hashimoto in 6 months  Signed, Beatriz Stallion, PA-C  09/11/2018 10:29 AM

## 2018-09-11 NOTE — Patient Instructions (Signed)
Medication Instructions:  1. INCREASE LISINOPRIL TO 20 MG DAILY; NEW RX HAS BEEN SENT IN If you need a refill on your cardiac medications before your next appointment, please call your pharmacy.   Lab work: NONE ORDERED TODAY If you have labs (blood work) drawn today and your tests are completely normal, you will receive your results only by: Marland Kitchen MyChart Message (if you have MyChart) OR . A paper copy in the mail If you have any lab test that is abnormal or we need to change your treatment, we will call you to review the results.  Testing/Procedures: NONE ORDERED TODAY  Follow-Up: At [facility], you and your health needs are our priority.  As part of our continuing mission to provide you with exceptional heart care, we have created designated Provider Care Teams.  These Care Teams include your primary Cardiologist (physician) and Advanced Practice Providers (APPs -  Physician Assistants and Nurse Practitioners) who all work together to provide you with the care you need, when you need it. You will need a follow up appointment in:  6 months.  Please call our office 2 months in advance to schedule this appointment.  You may see Kristeen Miss, MD or one of the following Advanced Practice Providers on your designated Care Team: Tereso Newcomer, PA-C [place], [place] . Berton Bon, NP  Any Other Special Instructions Will Be Listed Below (If Applicable).

## 2018-09-16 ENCOUNTER — Other Ambulatory Visit: Payer: Self-pay | Admitting: Internal Medicine

## 2018-09-16 DIAGNOSIS — Z1231 Encounter for screening mammogram for malignant neoplasm of breast: Secondary | ICD-10-CM

## 2018-09-17 ENCOUNTER — Encounter: Payer: Self-pay | Admitting: Internal Medicine

## 2018-09-17 ENCOUNTER — Ambulatory Visit (INDEPENDENT_AMBULATORY_CARE_PROVIDER_SITE_OTHER): Payer: Medicare Other | Admitting: *Deleted

## 2018-09-17 ENCOUNTER — Ambulatory Visit (INDEPENDENT_AMBULATORY_CARE_PROVIDER_SITE_OTHER): Payer: Medicare Other | Admitting: Internal Medicine

## 2018-09-17 DIAGNOSIS — I480 Paroxysmal atrial fibrillation: Secondary | ICD-10-CM

## 2018-09-17 DIAGNOSIS — Z5181 Encounter for therapeutic drug level monitoring: Secondary | ICD-10-CM

## 2018-09-17 DIAGNOSIS — Z95810 Presence of automatic (implantable) cardiac defibrillator: Secondary | ICD-10-CM | POA: Diagnosis not present

## 2018-09-17 DIAGNOSIS — I5022 Chronic systolic (congestive) heart failure: Secondary | ICD-10-CM

## 2018-09-17 LAB — POCT INR: INR: 3.6 — AB (ref 2.0–3.0)

## 2018-09-17 NOTE — Progress Notes (Signed)
HPI [name] returns today for followup. She is a pleasant 67 yo woman with an ICM, chronic systolic heart failure and PAF. She has done well in the interim. She denies chest pain or sob. She admits to not exercising. No edema. She has not been able to lose weight. Her CHF symptoms are class 2A. No Known Allergies   Current Outpatient Medications  Medication Sig Dispense Refill  . amiodarone (PACERONE) 200 MG tablet Take 200 mg by mouth 2 (two) times a week. Patient takes Amiodarone 200mg  2 timeS a week, take one on Monday and Thursday.    Marland Kitchen atorvastatin (LIPITOR) 80 MG tablet TAKE 1 TABLET BY MOUTH ONCE DAILY 90 tablet 2  . carvedilol (COREG) 25 MG tablet TAKE 1 TABLET BY MOUTH TWICE DAILY WITH MEALS 180 tablet 3  . clopidogrel (PLAVIX) 75 MG tablet Take 1 tablet (75 mg total) by mouth daily. 90 tablet 3  . furosemide (LASIX) 40 MG tablet TAKE 1 TABLET BY MOUTH ONCE DAILY 90 tablet 2  . lisinopril (PRINIVIL,ZESTRIL) 20 MG tablet Take 1 tablet (20 mg total) by mouth daily. 90 tablet 3  . potassium chloride (KLOR-CON M10) 10 MEQ tablet Take 1 tablet (10 mEq total) by mouth daily. 90 tablet 3  . spironolactone (ALDACTONE) 25 MG tablet TAKE 1 TABLET BY MOUTH ONCE DAILY 90 tablet 3  . warfarin (COUMADIN) 5 MG tablet USE AS DIRECTED BY COUMADIN CLINIC 90 tablet 1   No current facility-administered medications for this visit.      Past Medical History:  Diagnosis Date  . CHF (congestive heart failure) (HCC)    EF 30-35% BY ECHO. SHE HAS AN APICAL ANEURYSM  . Coronary artery disease 12/12/2008   STATUS POST ANTERIOR WALL MYOCARDIAL INFARCTION. STATUS POST PTCA AND STENTING OF HER LAD  . Dyslipidemia   . Transient atrial fibrillation or flutter     ROS:   All systems reviewed and negative except as noted in the HPI.   Past Surgical History:  Procedure Laterality Date  . CARDIAC DEFIBRILLATOR PLACEMENT       Family History  Problem Relation Age of Onset  . Hypertension  Mother   . Heart disease Sister   . Heart disease Brother   . Breast cancer Neg Hx      Social History   Socioeconomic History  . Marital status: Married    Spouse name: Not on file  . Number of children: Not on file  . Years of education: Not on file  . Highest education level: Not on file  Occupational History  . Not on file  Social Needs  . Financial resource strain: Not on file  . Food insecurity:    Worry: Not on file    Inability: Not on file  . Transportation needs:    Medical: Not on file    Non-medical: Not on file  Tobacco Use  . Smoking status: Former Smoker    Last attempt to quit: 12/14/2008    Years since quitting: 9.7  . Smokeless tobacco: Never Used  Substance and Sexual Activity  . Alcohol use: No  . Drug use: No  . Sexual activity: Not on file  Lifestyle  . Physical activity:    Days per week: Not on file    Minutes per session: Not on file  . Stress: Not on file  Relationships  . Social connections:    Talks on phone: Not on file    Gets together: Not  on file    Attends religious service: Not on file    Active member of club or organization: Not on file    Attends meetings of clubs or organizations: Not on file    Relationship status: Not on file  . Intimate partner violence:    Fear of current or ex partner: Not on file    Emotionally abused: Not on file    Physically abused: Not on file    Forced sexual activity: Not on file  Other Topics Concern  . Not on file  Social History Narrative  . Not on file     BP 136/82   Pulse 73   Ht 5\' 11"  (1.803 m)   Wt 230 lb (104.3 kg)   BMI 32.08 kg/m   Physical Exam:  Well appearing NAD HEENT: Unremarkable Neck:  6 cm JVD, no thyromegally Lymphatics:  No adenopathy Back:  No CVA tenderness Lungs:  Clear with no wheezes HEART:  Regular rate rhythm, no murmurs, no rubs, no clicks Abd:  soft, positive bowel sounds, no organomegally, no rebound, no guarding Ext:  2 plus pulses, no edema, no  cyanosis, no clubbing Skin:  No rashes no nodules Neuro:  CN II through XII intact, motor grossly intact  EKG - nsr with septal infarct  DEVICE  Normal device function.  See PaceArt for details.   Assess/Plan: 1. Chronic systolic heart failure - her symptoms are class 2. She will continue her current meds. 2. PAF - she has maintained NSR. She will continue systemic anti-coagulation. 3. ICD - her medtronic single chamber ICD is approaching ERI. We discussed placement of her new device and she would like to have this done.  4. CAD  - she is s/p remote MI with EF of 30%. She denies anginal symptoms.   Leonia Reeves.D.

## 2018-09-17 NOTE — Patient Instructions (Signed)
Medication Instructions:  Your physician recommends that you continue on your current medications as directed. Please refer to the Current Medication list given to you today.  If you need a refill on your cardiac medications before your next appointment, please call your pharmacy.   Lab work: none If you have labs (blood work) drawn today and your tests are completely normal, you will receive your results only by: Marland Kitchen MyChart Message (if you have MyChart) OR . A paper copy in the mail If you have any lab test that is abnormal or we need to change your treatment, we will call you to review the results.  Testing/Procedures: none  Follow-Up: Your physician recommends that you schedule a follow-up appointment with device clinic in 3 months.  Your physician wants you to follow-up in: 1 year with Dr. Ladona Ridgel.  You will receive a reminder letter in the mail two months in advance. If you don't receive a letter, please call our office to schedule the follow-up appointment.  Any Other Special Instructions Will Be Listed Below (If Applicable). none

## 2018-09-17 NOTE — Patient Instructions (Signed)
Description   Do not take any Coumadin today then continue taking 1 tablet daily except 1/2 tablet on Tuesdays, Thursdays, and Saturdays.  Recheck INR in 4 weeks. Call us with any medication changes or concerns # [phone].

## 2018-09-18 LAB — CUP PACEART INCLINIC DEVICE CHECK
Battery Voltage: 2.64 V
Brady Statistic RV Percent Paced: 0 %
Date Time Interrogation Session: 20191105150257
HighPow Impedance: 52 Ohm
HighPow Impedance: 72 Ohm
Implantable Lead Implant Date: 20110627
Implantable Lead Location: 753860
Implantable Lead Model: 6947
Implantable Pulse Generator Implant Date: 20110627
Lead Channel Sensing Intrinsic Amplitude: 10.25 mV
Lead Channel Setting Sensing Sensitivity: 0.3 mV
MDC IDC MSMT LEADCHNL RV IMPEDANCE VALUE: 532 Ohm
MDC IDC MSMT LEADCHNL RV SENSING INTR AMPL: 11.375 mV
MDC IDC SET LEADCHNL RV PACING AMPLITUDE: 2.5 V
MDC IDC SET LEADCHNL RV PACING PULSEWIDTH: 0.4 ms

## 2018-10-01 ENCOUNTER — Other Ambulatory Visit: Payer: Self-pay | Admitting: Cardiovascular Disease

## 2018-10-15 ENCOUNTER — Ambulatory Visit (INDEPENDENT_AMBULATORY_CARE_PROVIDER_SITE_OTHER): Payer: Medicare Other | Admitting: Pharmacist

## 2018-10-15 DIAGNOSIS — Z5181 Encounter for therapeutic drug level monitoring: Secondary | ICD-10-CM

## 2018-10-15 DIAGNOSIS — I480 Paroxysmal atrial fibrillation: Secondary | ICD-10-CM

## 2018-10-15 LAB — POCT INR: INR: 2.9 (ref 2.0–3.0)

## 2018-10-15 NOTE — Patient Instructions (Signed)
Description   Continue taking 1 tablet daily except 1/2 tablet on Tuesdays, Thursdays, and Saturdays.  Recheck INR in 6 weeks. Call us with any medication changes or concerns # [phone].

## 2018-10-22 ENCOUNTER — Other Ambulatory Visit: Payer: Self-pay | Admitting: Cardiovascular Disease

## 2018-11-04 ENCOUNTER — Ambulatory Visit
Admission: RE | Admit: 2018-11-04 | Discharge: 2018-11-04 | Disposition: A | Discharge status: Home / Self Care | Payer: Medicare Other | Source: Ambulatory Visit | Attending: Internal Medicine | Admitting: Internal Medicine

## 2018-11-04 DIAGNOSIS — Z1231 Encounter for screening mammogram for malignant neoplasm of breast: Secondary | ICD-10-CM | POA: Insufficient documentation

## 2018-11-19 ENCOUNTER — Other Ambulatory Visit: Payer: Self-pay | Admitting: Cardiovascular Disease

## 2018-11-19 DIAGNOSIS — I2510 Atherosclerotic heart disease of native coronary artery without angina pectoris: Secondary | ICD-10-CM

## 2018-11-19 DIAGNOSIS — I2583 Coronary atherosclerosis due to lipid rich plaque: Principal | ICD-10-CM

## 2018-11-27 ENCOUNTER — Ambulatory Visit (INDEPENDENT_AMBULATORY_CARE_PROVIDER_SITE_OTHER): Payer: Medicare Other | Admitting: Pharmacist

## 2018-11-27 DIAGNOSIS — I480 Paroxysmal atrial fibrillation: Secondary | ICD-10-CM

## 2018-11-27 DIAGNOSIS — Z5181 Encounter for therapeutic drug level monitoring: Secondary | ICD-10-CM | POA: Diagnosis not present

## 2018-11-27 LAB — POCT INR: INR: 3.8 — AB (ref 2.0–3.0)

## 2018-11-27 NOTE — Patient Instructions (Signed)
Description   Skip your dose today, then continue taking 1 tablet daily except 1/2 tablet on Tuesdays, Thursdays, and Saturdays. Recheck INR in 3 weeks. Call us with any medication changes or concerns # [phone].

## 2018-12-18 ENCOUNTER — Ambulatory Visit (INDEPENDENT_AMBULATORY_CARE_PROVIDER_SITE_OTHER): Payer: Medicare Other | Admitting: Pharmacist

## 2018-12-18 ENCOUNTER — Ambulatory Visit (INDEPENDENT_AMBULATORY_CARE_PROVIDER_SITE_OTHER): Payer: Medicare Other | Admitting: Nurse Practitioner

## 2018-12-18 DIAGNOSIS — I480 Paroxysmal atrial fibrillation: Secondary | ICD-10-CM | POA: Diagnosis not present

## 2018-12-18 DIAGNOSIS — I5022 Chronic systolic (congestive) heart failure: Secondary | ICD-10-CM | POA: Diagnosis not present

## 2018-12-18 DIAGNOSIS — Z5181 Encounter for therapeutic drug level monitoring: Secondary | ICD-10-CM

## 2018-12-18 LAB — CUP PACEART INCLINIC DEVICE CHECK
Date Time Interrogation Session: 20200205081943
Implantable Lead Implant Date: 20110627
Implantable Lead Location: 753860
Implantable Lead Model: 6947
Implantable Pulse Generator Implant Date: 20110627

## 2018-12-18 LAB — POCT INR: INR: 2.7 (ref 2.0–3.0)

## 2018-12-18 NOTE — Progress Notes (Signed)
ICD check in clinic. Normal device function. Thresholds and sensing consistent with previous device measurements. Impedance trends stable over time. No evidence of any ventricular arrhythmias. Histogram distribution appropriate for patient and level of activity. No changes made this session. Device programmed at appropriate safety margins. Device programmed to optimize intrinsic conduction. Battery voltage 2.63V but not tripped ERI. Pt declines remote follow up. Alert tones demonstrated. Follow up in 3 months if no alert tone prior. Patient education completed including shock plan. Alert tones/vibration demonstrated for patient.

## 2018-12-18 NOTE — Patient Instructions (Signed)
Description   Continue taking 1 tablet daily except 1/2 tablet on Tuesdays, Thursdays, and Saturdays. Recheck INR in 4 weeks. Call us with any medication changes or concerns # [phone].

## 2019-01-15 ENCOUNTER — Ambulatory Visit (INDEPENDENT_AMBULATORY_CARE_PROVIDER_SITE_OTHER): Payer: Medicare Other | Admitting: *Deleted

## 2019-01-15 DIAGNOSIS — I480 Paroxysmal atrial fibrillation: Secondary | ICD-10-CM

## 2019-01-15 DIAGNOSIS — Z5181 Encounter for therapeutic drug level monitoring: Secondary | ICD-10-CM

## 2019-01-15 LAB — POCT INR: INR: 3.2 — AB (ref 2.0–3.0)

## 2019-01-15 NOTE — Patient Instructions (Signed)
Description   Hold today's dose then continue taking 1 tablet daily except 1/2 tablet on Tuesdays, Thursdays, and Saturdays. Recheck INR in 4 weeks. Call us with any medication changes or concerns # [phone].

## 2019-02-11 ENCOUNTER — Telehealth: Payer: Self-pay | Admitting: Pharmacist

## 2019-02-11 ENCOUNTER — Other Ambulatory Visit: Payer: Self-pay | Admitting: Cardiovascular Disease

## 2019-02-11 NOTE — Telephone Encounter (Signed)
LMOM to COVID prescreen pt for INR check tomorrow.  

## 2019-02-11 NOTE — Telephone Encounter (Signed)
1. Do you currently have a fever? no (yes = cancel and refer to pcp for e-visit) 2. Have you recently travelled on a cruise, internationally, or to [place], [place], [place], [place], [place], or [place], [place] [employer]) ? no (yes = cancel, stay home, monitor symptoms, and contact pcp or initiate e-visit if symptoms develop) 3. Have you been in contact with someone that is currently pending confirmation of Covid19 testing or has been confirmed to have the Covid19 virus?   (yes = cancel, stay home, away from tested individual, monitor symptoms, and contact pcp or initiate e-visit if symptoms develop) 4. Are you currently experiencing fatigue or cough? no (yes = pt should be prepared to have a mask placed at the time of their visit).  Pt. Advised that we are restricting visitors at this time and anyone present in the vehicle should meet the above criteria as well. Advised that visit will be at curbside for finger stick ONLY and will receive call with instructions. Pt also advised to please bring own pen for signature of arrival document.

## 2019-02-12 ENCOUNTER — Other Ambulatory Visit: Payer: Self-pay

## 2019-02-12 ENCOUNTER — Ambulatory Visit (INDEPENDENT_AMBULATORY_CARE_PROVIDER_SITE_OTHER): Payer: Medicare Other | Admitting: Pharmacist

## 2019-02-12 DIAGNOSIS — I480 Paroxysmal atrial fibrillation: Secondary | ICD-10-CM

## 2019-02-12 DIAGNOSIS — Z5181 Encounter for therapeutic drug level monitoring: Secondary | ICD-10-CM

## 2019-02-12 LAB — POCT INR: INR: 3.5 — AB (ref 2.0–3.0)

## 2019-02-12 MED ORDER — APIXABAN 5 MG PO TABS: 5.0000 mg | ORAL_TABLET | Freq: Two times a day (BID) | ORAL | 5 refills | Status: DC

## 2019-02-16 ENCOUNTER — Telehealth: Payer: Self-pay | Admitting: *Deleted

## 2019-02-16 NOTE — Telephone Encounter (Signed)
Spoke with patient to reschedule 4/6 DC appointment due to provider illness. Pt has not heard ICD alert tone for ERI. She is agreeable to moving appointment to 02/24/19 at 9:00am. She denies questions or concerns at this time.

## 2019-02-19 ENCOUNTER — Telehealth: Payer: Self-pay

## 2019-02-19 ENCOUNTER — Telehealth: Payer: Self-pay | Admitting: Cardiovascular Disease

## 2019-02-19 ENCOUNTER — Ambulatory Visit: Payer: Self-pay | Admitting: Urology

## 2019-02-19 NOTE — Telephone Encounter (Signed)
Patient returning your call.

## 2019-02-19 NOTE — Telephone Encounter (Signed)
New Message ° ° ° °Pt is returning call  ° ° ° °Please call back  °

## 2019-02-19 NOTE — Telephone Encounter (Signed)
Spoke with pt and she preferred to have her appt rescheduled. She wants to see Dr. Elease Hashimoto in person. Pt was rescheduled to 06/16/2019 at 9am.

## 2019-02-19 NOTE — Telephone Encounter (Signed)
Left message for pt to call back about appt. 

## 2019-02-24 ENCOUNTER — Other Ambulatory Visit: Payer: Self-pay

## 2019-02-24 ENCOUNTER — Ambulatory Visit (INDEPENDENT_AMBULATORY_CARE_PROVIDER_SITE_OTHER): Payer: Medicare Other | Admitting: *Deleted

## 2019-02-24 DIAGNOSIS — I255 Ischemic cardiomyopathy: Secondary | ICD-10-CM

## 2019-02-24 DIAGNOSIS — Z95810 Presence of automatic (implantable) cardiac defibrillator: Secondary | ICD-10-CM | POA: Diagnosis not present

## 2019-02-24 LAB — CUP PACEART INCLINIC DEVICE CHECK
Battery Voltage: 2.62 V
Brady Statistic RV Percent Paced: 0 %
Date Time Interrogation Session: 20200413091922
HighPow Impedance: 52 Ohm
HighPow Impedance: 57 Ohm
Implantable Lead Implant Date: 20110627
Implantable Lead Location: 753860
Implantable Lead Model: 6947
Implantable Pulse Generator Implant Date: 20110627
Lead Channel Impedance Value: 494 Ohm
Lead Channel Pacing Threshold Amplitude: 0.75 V
Lead Channel Pacing Threshold Pulse Width: 0.4 ms
Lead Channel Sensing Intrinsic Amplitude: 9.625 mV
Lead Channel Setting Pacing Amplitude: 2.5 V
Lead Channel Setting Pacing Pulse Width: 0.4 ms
Lead Channel Setting Sensing Sensitivity: 0.3 mV

## 2019-02-24 NOTE — Progress Notes (Signed)
ICD check in clinic. Normal device function. Threshold and sensing consistent with previous device measurements. Impedance trends stable over time. No evidence of any ventricular arrhythmias. Histogram distribution appropriate for patient and level of activity. Device programmed at appropriate safety margins. Device programmed to optimize intrinsic conduction. Battery voltage 2.62V (RRT at 2.63V), RRT not yet triggered, alert tone for RRT disabled today, alert tone for EOS remains enabled. Patient declines [facility] monitoring. Alert tone demonstrated for patient. Plan for generator replacement next available.

## 2019-02-24 NOTE — Patient Instructions (Signed)
Your ICD is very close to the recommended replacement time. You still have 3+ months of full device function. Irving Burton, RN, will make Dr. Ladona Ridgel aware and his nurse will contact you to setup a procedure date. If you have any questions, or if you hear your alert tone, please call the [facility] at [phone].

## 2019-03-10 ENCOUNTER — Ambulatory Visit: Payer: Medicare Other | Admitting: Cardiovascular Disease

## 2019-04-01 ENCOUNTER — Telehealth: Payer: Self-pay

## 2019-04-01 DIAGNOSIS — I255 Ischemic cardiomyopathy: Secondary | ICD-10-CM

## 2019-04-04 NOTE — Telephone Encounter (Signed)
Call placed to Pt.  Pt scheduled for gen change June 2  Scheduled for labs/covid test on May 29  Instruction letter mailed today  Left message today notifying of Plavix/Eliquis instruction (see letter)

## 2019-04-11 ENCOUNTER — Other Ambulatory Visit: Payer: Medicare Other | Admitting: *Deleted

## 2019-04-11 ENCOUNTER — Other Ambulatory Visit (HOSPITAL_COMMUNITY)
Admission: RE | Admit: 2019-04-11 | Discharge: 2019-04-11 | Disposition: A | Discharge status: Home / Self Care | Payer: Medicare Other | Source: Ambulatory Visit | Attending: Internal Medicine | Admitting: Internal Medicine

## 2019-04-11 ENCOUNTER — Other Ambulatory Visit: Payer: Self-pay

## 2019-04-11 DIAGNOSIS — I255 Ischemic cardiomyopathy: Secondary | ICD-10-CM

## 2019-04-11 DIAGNOSIS — Z1159 Encounter for screening for other viral diseases: Secondary | ICD-10-CM | POA: Insufficient documentation

## 2019-04-11 LAB — CBC WITH DIFFERENTIAL/PLATELET
Basophils Absolute: 0 10*3/uL (ref 0.0–0.2)
Basos: 1 %
EOS (ABSOLUTE): 0.1 10*3/uL (ref 0.0–0.4)
Eos: 2 %
Hematocrit: 37 % (ref 34.0–46.6)
Hemoglobin: 12.2 g/dL (ref 11.1–15.9)
Immature Grans (Abs): 0 10*3/uL (ref 0.0–0.1)
Immature Granulocytes: 0 %
Lymphocytes Absolute: 1.7 10*3/uL (ref 0.7–3.1)
Lymphs: 29 %
MCH: 29 pg (ref 26.6–33.0)
MCHC: 33 g/dL (ref 31.5–35.7)
MCV: 88 fL (ref 79–97)
Monocytes Absolute: 0.6 10*3/uL (ref 0.1–0.9)
Monocytes: 11 %
Neutrophils Absolute: 3.3 10*3/uL (ref 1.4–7.0)
Neutrophils: 57 %
Platelets: 203 10*3/uL (ref 150–450)
RBC: 4.2 x10E6/uL (ref 3.77–5.28)
RDW: 13.1 % (ref 11.7–15.4)
WBC: 5.8 10*3/uL (ref 3.4–10.8)

## 2019-04-11 LAB — BASIC METABOLIC PANEL
BUN/Creatinine Ratio: 14 (ref 12–28)
BUN: 12 mg/dL (ref 8–27)
CO2: 24 mmol/L (ref 20–29)
Calcium: 9.3 mg/dL (ref 8.7–10.3)
Chloride: 101 mmol/L (ref 96–106)
Creatinine, Ser: 0.88 mg/dL (ref 0.57–1.00)
GFR calc Af Amer: 79 mL/min/{1.73_m2} (ref >59)
GFR calc non Af Amer: 68 mL/min/{1.73_m2} (ref >59)
Glucose: 125 mg/dL — ABNORMAL HIGH (ref 65–99)
Potassium: 4.1 mmol/L (ref 3.5–5.2)
Sodium: 139 mmol/L (ref 134–144)

## 2019-04-12 LAB — NOVEL CORONAVIRUS, NAA (HOSP ORDER, SEND-OUT TO REF LAB; TAT 18-24 HRS): SARS-CoV-2, NAA: NOT DETECTED

## 2019-04-15 ENCOUNTER — Other Ambulatory Visit: Payer: Self-pay

## 2019-04-15 ENCOUNTER — Encounter (HOSPITAL_COMMUNITY): Payer: Self-pay | Admitting: Internal Medicine

## 2019-04-15 ENCOUNTER — Encounter (HOSPITAL_COMMUNITY)
Admission: RE | Disposition: A | Discharge status: Home / Self Care | Payer: Self-pay | Source: Home / Self Care | Attending: Internal Medicine

## 2019-04-15 ENCOUNTER — Ambulatory Visit (HOSPITAL_COMMUNITY)
Admission: RE | Admit: 2019-04-15 | Discharge: 2019-04-15 | Disposition: A | Discharge status: Home / Self Care | Payer: Medicare Other | Attending: Internal Medicine | Admitting: Internal Medicine

## 2019-04-15 DIAGNOSIS — I2510 Atherosclerotic heart disease of native coronary artery without angina pectoris: Secondary | ICD-10-CM | POA: Insufficient documentation

## 2019-04-15 DIAGNOSIS — I255 Ischemic cardiomyopathy: Secondary | ICD-10-CM | POA: Diagnosis not present

## 2019-04-15 DIAGNOSIS — Z7902 Long term (current) use of antithrombotics/antiplatelets: Secondary | ICD-10-CM | POA: Insufficient documentation

## 2019-04-15 DIAGNOSIS — Z4502 Encounter for adjustment and management of automatic implantable cardiac defibrillator: Secondary | ICD-10-CM | POA: Insufficient documentation

## 2019-04-15 DIAGNOSIS — Z8249 Family history of ischemic heart disease and other diseases of the circulatory system: Secondary | ICD-10-CM | POA: Diagnosis not present

## 2019-04-15 DIAGNOSIS — Z79899 Other long term (current) drug therapy: Secondary | ICD-10-CM | POA: Insufficient documentation

## 2019-04-15 DIAGNOSIS — E785 Hyperlipidemia, unspecified: Secondary | ICD-10-CM | POA: Diagnosis not present

## 2019-04-15 DIAGNOSIS — Z955 Presence of coronary angioplasty implant and graft: Secondary | ICD-10-CM | POA: Insufficient documentation

## 2019-04-15 DIAGNOSIS — I252 Old myocardial infarction: Secondary | ICD-10-CM | POA: Diagnosis not present

## 2019-04-15 DIAGNOSIS — Z95810 Presence of automatic (implantable) cardiac defibrillator: Secondary | ICD-10-CM | POA: Diagnosis not present

## 2019-04-15 DIAGNOSIS — I5022 Chronic systolic (congestive) heart failure: Secondary | ICD-10-CM | POA: Diagnosis not present

## 2019-04-15 DIAGNOSIS — Z006 Encounter for examination for normal comparison and control in clinical research program: Secondary | ICD-10-CM | POA: Insufficient documentation

## 2019-04-15 DIAGNOSIS — Z803 Family history of malignant neoplasm of breast: Secondary | ICD-10-CM | POA: Diagnosis not present

## 2019-04-15 DIAGNOSIS — Z87891 Personal history of nicotine dependence: Secondary | ICD-10-CM | POA: Diagnosis not present

## 2019-04-15 DIAGNOSIS — Z7901 Long term (current) use of anticoagulants: Secondary | ICD-10-CM | POA: Diagnosis not present

## 2019-04-15 DIAGNOSIS — I480 Paroxysmal atrial fibrillation: Secondary | ICD-10-CM | POA: Diagnosis present

## 2019-04-15 HISTORY — PX: ICD GENERATOR CHANGEOUT: EP1231

## 2019-04-15 LAB — SURGICAL PCR SCREEN
MRSA, PCR: NEGATIVE
Staphylococcus aureus: NEGATIVE

## 2019-04-15 SURGERY — ICD GENERATOR CHANGEOUT

## 2019-04-15 MED ORDER — FENTANYL CITRATE (PF) 100 MCG/2ML IJ SOLN
INTRAMUSCULAR | Status: AC
  Filled 2019-04-15: qty 2

## 2019-04-15 MED ORDER — ONDANSETRON HCL 4 MG/2ML IJ SOLN: 4.0000 mg | Freq: Four times a day (QID) | INTRAMUSCULAR | Status: DC | PRN

## 2019-04-15 MED ORDER — MUPIROCIN 2 % EX OINT
TOPICAL_OINTMENT | CUTANEOUS | Status: AC
  Administered 2019-04-15: 07:00:00
  Filled 2019-04-15: qty 22

## 2019-04-15 MED ORDER — CEFAZOLIN SODIUM-DEXTROSE 2-4 GM/100ML-% IV SOLN
2.0000 g | INTRAVENOUS | Status: AC
  Administered 2019-04-15: 2 g via INTRAVENOUS
  Filled 2019-04-15: qty 100

## 2019-04-15 MED ORDER — FENTANYL CITRATE (PF) 100 MCG/2ML IJ SOLN
INTRAMUSCULAR | Status: DC | PRN
  Administered 2019-04-15: 25 ug via INTRAVENOUS
  Administered 2019-04-15: 12.5 ug via INTRAVENOUS

## 2019-04-15 MED ORDER — MIDAZOLAM HCL 5 MG/5ML IJ SOLN
INTRAMUSCULAR | Status: AC
  Filled 2019-04-15: qty 5

## 2019-04-15 MED ORDER — LIDOCAINE HCL (PF) 1 % IJ SOLN
INTRAMUSCULAR | Status: DC | PRN
  Administered 2019-04-15: 50 mL

## 2019-04-15 MED ORDER — SODIUM CHLORIDE 0.9 % IV SOLN
INTRAVENOUS | Status: AC
  Filled 2019-04-15: qty 2

## 2019-04-15 MED ORDER — SODIUM CHLORIDE 0.9 % IV SOLN
80.0000 mg | INTRAVENOUS | Status: AC
  Administered 2019-04-15: 80 mg
  Filled 2019-04-15: qty 2

## 2019-04-15 MED ORDER — ACETAMINOPHEN 325 MG PO TABS
325.0000 mg | ORAL_TABLET | ORAL | Status: DC | PRN
  Filled 2019-04-15: qty 2

## 2019-04-15 MED ORDER — CEFAZOLIN SODIUM-DEXTROSE 2-4 GM/100ML-% IV SOLN
INTRAVENOUS | Status: AC
  Filled 2019-04-15: qty 100

## 2019-04-15 MED ORDER — CHLORHEXIDINE GLUCONATE 4 % EX LIQD
60.0000 mL | Freq: Once | CUTANEOUS | Status: DC
  Filled 2019-04-15: qty 60

## 2019-04-15 MED ORDER — LIDOCAINE HCL 1 % IJ SOLN
INTRAMUSCULAR | Status: AC
  Filled 2019-04-15: qty 60

## 2019-04-15 MED ORDER — MIDAZOLAM HCL 5 MG/5ML IJ SOLN
INTRAMUSCULAR | Status: DC | PRN
  Administered 2019-04-15 (×3): 1 mg via INTRAVENOUS

## 2019-04-15 MED ORDER — SODIUM CHLORIDE 0.9 % IV SOLN
INTRAVENOUS | Status: DC
  Administered 2019-04-15: 07:00:00 via INTRAVENOUS

## 2019-04-15 SURGICAL SUPPLY — 4 items
CABLE SURGICAL S-101-97-12 (CABLE) ×3 IMPLANT
ICD VISIA MRI DVFB1D1 (ICD Generator) ×3 IMPLANT
PAD PRO RADIOLUCENT 2001M-C (PAD) ×3 IMPLANT
TRAY PACEMAKER INSERTION (PACKS) ×3 IMPLANT

## 2019-04-15 NOTE — H&P (Signed)
HPI [name] returns today for followup. She is a pleasant 68 yo woman with an ICM, chronic systolic heart failure and PAF. She has done well in the interim. She denies chest pain or sob. She admits to not exercising. No edema. She has not been able to lose weight. Her CHF symptoms are class 2A. No Known Allergies         Current Outpatient Medications  Medication Sig Dispense Refill  . amiodarone (PACERONE) 200 MG tablet Take 200 mg by mouth 2 (two) times a week. Patient takes Amiodarone 200mg  2 timeS a week, take one on Monday and Thursday.    Marland Kitchen atorvastatin (LIPITOR) 80 MG tablet TAKE 1 TABLET BY MOUTH ONCE DAILY 90 tablet 2  . carvedilol (COREG) 25 MG tablet TAKE 1 TABLET BY MOUTH TWICE DAILY WITH MEALS 180 tablet 3  . clopidogrel (PLAVIX) 75 MG tablet Take 1 tablet (75 mg total) by mouth daily. 90 tablet 3  . furosemide (LASIX) 40 MG tablet TAKE 1 TABLET BY MOUTH ONCE DAILY 90 tablet 2  . lisinopril (PRINIVIL,ZESTRIL) 20 MG tablet Take 1 tablet (20 mg total) by mouth daily. 90 tablet 3  . potassium chloride (KLOR-CON M10) 10 MEQ tablet Take 1 tablet (10 mEq total) by mouth daily. 90 tablet 3  . spironolactone (ALDACTONE) 25 MG tablet TAKE 1 TABLET BY MOUTH ONCE DAILY 90 tablet 3  . warfarin (COUMADIN) 5 MG tablet USE AS DIRECTED BY COUMADIN CLINIC 90 tablet 1   No current facility-administered medications for this visit.          Past Medical History:  Diagnosis Date  . CHF (congestive heart failure) (HCC)    EF 30-35% BY ECHO. SHE HAS AN APICAL ANEURYSM  . Coronary artery disease 12/12/2008   STATUS POST ANTERIOR WALL MYOCARDIAL INFARCTION. STATUS POST PTCA AND STENTING OF HER LAD  . Dyslipidemia   . Transient atrial fibrillation or flutter     ROS:   All systems reviewed and negative except as noted in the HPI.        Past Surgical History:  Procedure Laterality Date  . CARDIAC DEFIBRILLATOR PLACEMENT            Family  History  Problem Relation Age of Onset  . Hypertension Mother   . Heart disease Sister   . Heart disease Brother   . Breast cancer Neg Hx      Social History        Socioeconomic History  . Marital status: Married    Spouse name: Not on file  . Number of children: Not on file  . Years of education: Not on file  . Highest education level: Not on file  Occupational History  . Not on file  Social Needs  . Financial resource strain: Not on file  . Food insecurity:    Worry: Not on file    Inability: Not on file  . Transportation needs:    Medical: Not on file    Non-medical: Not on file  Tobacco Use  . Smoking status: Former Smoker    Last attempt to quit: 12/14/2008    Years since quitting: 9.7  . Smokeless tobacco: Never Used  Substance and Sexual Activity  . Alcohol use: No  . Drug use: No  . Sexual activity: Not on file  Lifestyle  . Physical activity:    Days per week: Not on file    Minutes per session: Not on file  .  Stress: Not on file  Relationships  . Social connections:    Talks on phone: Not on file    Gets together: Not on file    Attends religious service: Not on file    Active member of club or organization: Not on file    Attends meetings of clubs or organizations: Not on file    Relationship status: Not on file  . Intimate partner violence:    Fear of current or ex partner: Not on file    Emotionally abused: Not on file    Physically abused: Not on file    Forced sexual activity: Not on file  Other Topics Concern  . Not on file  Social History Narrative  . Not on file     BP 136/82   Pulse 73   Ht 5\' 11"  (1.803 m)   Wt 230 lb (104.3 kg)   BMI 32.08 kg/m   Physical Exam:  Well appearing NAD HEENT: Unremarkable Neck:  6 cm JVD, no thyromegally Lymphatics:  No adenopathy Back:  No CVA tenderness Lungs:  Clear with no wheezes HEART:  Regular rate rhythm, no murmurs, no rubs, no  clicks Abd:  soft, positive bowel sounds, no organomegally, no rebound, no guarding Ext:  2 plus pulses, no edema, no cyanosis, no clubbing Skin:  No rashes no nodules Neuro:  CN II through XII intact, motor grossly intact  EKG - nsr with septal infarct  DEVICE  Normal device function.  See PaceArt for details.   Assess/Plan: 1. Chronic systolic heart failure - her symptoms are class 2. She will continue her current meds. 2. PAF - she has maintained NSR. She will continue systemic anti-coagulation. 3. ICD - her medtronic single chamber ICD is approaching ERI. We discussed placement of her new device and she would like to have this done.  4. CAD  - she is s/p remote MI with EF of 30%. She denies anginal symptoms.   [name].  EP Attending  Patient seen and examined. Agree with the findings as noted above. The patient presents today for ICD gen change as she has reached ERI, with voltage of 2.61 on her ICD. ERI voltage is 2.63. I have reviewed the indications/risks/benefits/goals/expectations of the procedure with the patient and she wishes to proceed.  Leonia Reeves.D.

## 2019-04-15 NOTE — Discharge Instructions (Signed)
Pacemaker Battery Change, Care After  This sheet gives you information about how to care for yourself after your procedure. Your health care provider may also give you more specific instructions. If you have problems or questions, contact your health care provider.  What can I expect after the procedure?  After your procedure, it is common to have:   Pain or soreness at the site where the pacemaker was inserted.   Swelling at the site where the pacemaker was inserted.  Follow these instructions at home:  Incision care     Keep the incision clean and dry.  ? Do not take baths, swim, or use a hot tub until your health care provider approves.  ? You may shower the day after your procedure, or as directed by your health care provider.  ? Pat the area dry with a clean towel. Do not rub the area. This may cause bleeding.   Follow instructions from your health care provider about how to take care of your incision. Make sure you:  ? Wash your hands with soap and water before you change your bandage (dressing). If soap and water are not available, use hand sanitizer.  ? Change your dressing as told by your health care provider.  ? Leave stitches (sutures), skin glue, or adhesive strips in place. These skin closures may need to stay in place for 2 weeks or longer. If adhesive strip edges start to loosen and curl up, you may trim the loose edges. Do not remove adhesive strips completely unless your health care provider tells you to do that.   Check your incision area every day for signs of infection. Check for:  ? More redness, swelling, or pain.  ? More fluid or blood.  ? Warmth.  ? Pus or a bad smell.  Activity   Do not lift anything that is heavier than 10 lb (4.5 kg) until your health care provider says it is okay to do so.   For the first 2 weeks, or as long as told by your health care provider:  ? Avoid lifting your left arm higher than your shoulder.  ? Be gentle when you move your arms over your head. It is okay  to raise your arm to comb your hair.  ? Avoid strenuous exercise.   Ask your health care provider when it is okay to:  ? Resume your normal activities.  ? Return to work or school.  ? Resume sexual activity.  Eating and drinking   Eat a heart-healthy diet. This should include plenty of fresh fruits and vegetables, whole grains, low-fat dairy products, and lean protein like chicken and fish.   Limit alcohol intake to no more than 1 drink a day for non-pregnant women and 2 drinks a day for men. One drink equals 12 oz of beer, 5 oz of wine, or 1 oz of hard liquor.   Check ingredients and nutrition facts on packaged foods and beverages. Avoid the following types of food:  ? Food that is high in salt (sodium).  ? Food that is high in saturated fat, like full-fat dairy or red meat.  ? Food that is high in trans fat, like fried food.  ? Food and drinks that are high in sugar.  Lifestyle   Do not use any products that contain nicotine or tobacco, such as cigarettes and e-cigarettes. If you need help quitting, ask your health care provider.   Take steps to manage and control your weight.     Get regular exercise. Aim for 150 minutes of moderate-intensity exercise (such as walking or yoga) or 75 minutes of vigorous exercise (such as running or swimming) each week.   Manage other health problems, such as diabetes or high blood pressure. Ask your health care provider how you can manage these conditions.  General instructions   Do not drive for 24 hours after your procedure if you were given a medicine to help you relax (sedative).   Take over-the-counter and prescription medicines only as told by your health care provider.   Avoid putting pressure on the area where the pacemaker was placed.   If you need an MRI after your pacemaker has been placed, be sure to tell the health care provider who orders the MRI that you have a pacemaker.   Avoid close and prolonged exposure to electrical devices that have strong  magnetic fields. These include:  ? Cell phones. Avoid keeping them in a pocket near the pacemaker, and try using the ear opposite the pacemaker.  ? MP3 players.  ? Household appliances, like microwaves.  ? Metal detectors.  ? Electric generators.  ? High-tension wires.   Keep all follow-up visits as directed by your health care provider. This is important.  Contact a health care provider if:   You have pain at the incision site that is not relieved by over-the-counter or prescription medicines.   You have any of these around your incision site or coming from it:  ? More redness, swelling, or pain.  ? Fluid or blood.  ? Warmth to the touch.  ? Pus or a bad smell.   You have a fever.   You feel brief, occasional palpitations, light-headedness, or any symptoms that you think might be related to your heart.  Get help right away if:   You experience chest pain that is different from the pain at the pacemaker site.   You develop a red streak that extends above or below the incision site.   You experience shortness of breath.   You have palpitations or an irregular heartbeat.   You have light-headedness that does not go away quickly.   You faint or have dizzy spells.   Your pulse suddenly drops or increases rapidly and does not return to normal.   You begin to gain weight and your legs and ankles swell.  Summary   After your procedure, it is common to have pain, soreness, and some swelling where the pacemaker was inserted.   Make sure to keep your incision clean and dry. Follow instructions from your health care provider about how to take care of your incision.   Check your incision every day for signs of infection, such as more pain or swelling, pus or a bad smell, warmth, or leaking fluid and blood.   Avoid strenuous exercise and lifting your left arm higher than your shoulder for 2 weeks, or as long as told by your health care provider.  This information is not intended to replace advice given to you by  your health care provider. Make sure you discuss any questions you have with your health care provider.  Document Released: 08/20/2013 Document Revised: 09/21/2016 Document Reviewed: 09/21/2016  Elsevier Interactive Patient Education  2019 [employer]

## 2019-04-15 NOTE — Progress Notes (Signed)
Discharge instructions reviewed with pt and her daughter via telephone. . Voices understanding.

## 2019-04-22 ENCOUNTER — Other Ambulatory Visit: Payer: Self-pay | Admitting: Cardiovascular Disease

## 2019-04-23 ENCOUNTER — Telehealth: Payer: Self-pay

## 2019-04-23 NOTE — Telephone Encounter (Signed)
    COVID-19 Pre-Screening Questions:  . In the past 7 to 10 days have you had a cough,  shortness of breath, headache, congestion, fever (100 or greater) body aches, chills, sore throat, or sudden loss of taste or sense of smell? . Have you been around anyone with known Covid 19. . Have you been around anyone who is awaiting Covid 19 test results in the past 7 to 10 days? . Have you been around anyone who has been exposed to Covid 19, or has mentioned symptoms of Covid 19 within the past 7 to 10 days?  If you have any concerns/questions about symptoms patients report during screening (either on the phone or at threshold). Contact the provider seeing the patient or DOD for further guidance.  If neither are available contact a member of the leadership team.       LMOVM to call my direct office number.

## 2019-04-24 ENCOUNTER — Ambulatory Visit (INDEPENDENT_AMBULATORY_CARE_PROVIDER_SITE_OTHER): Payer: Medicare Other | Admitting: *Deleted

## 2019-04-24 ENCOUNTER — Ambulatory Visit: Payer: Medicare Other

## 2019-04-24 ENCOUNTER — Other Ambulatory Visit: Payer: Self-pay

## 2019-04-24 DIAGNOSIS — I5022 Chronic systolic (congestive) heart failure: Secondary | ICD-10-CM

## 2019-04-24 LAB — CUP PACEART INCLINIC DEVICE CHECK
Battery Remaining Longevity: 137 mo
Battery Voltage: 3.15 V
Brady Statistic RV Percent Paced: 0.01 %
Date Time Interrogation Session: 20200611132933
HighPow Impedance: 52 Ohm
HighPow Impedance: 75 Ohm
Implantable Lead Implant Date: 20110627
Implantable Lead Location: 753860
Implantable Lead Model: 6947
Implantable Pulse Generator Implant Date: 20200602
Lead Channel Impedance Value: 399 Ohm
Lead Channel Impedance Value: 551 Ohm
Lead Channel Pacing Threshold Amplitude: 0.75 V
Lead Channel Pacing Threshold Pulse Width: 0.4 ms
Lead Channel Sensing Intrinsic Amplitude: 9.875 mV
Lead Channel Setting Pacing Amplitude: 2.5 V
Lead Channel Setting Pacing Pulse Width: 0.4 ms
Lead Channel Setting Sensing Sensitivity: 0.3 mV

## 2019-04-24 NOTE — Progress Notes (Signed)
Wound check appointment. Steri-strips removed. Wound without redness or edema. Incision edges approximated, wound healing well. Normal device function. Thresholds, sensing, and impedances consistent with implant measurements. Device programmed at chronic outputs, max lead impedance increased to 2000 ohms per protocol. Histogram distribution appropriate for patient and level of activity. AT/AF burden 2.1%, + amio & eliquis. No ventricular arrhythmias noted. Patient educated about wound care, arm mobility, lifting restrictions, shock plan. ROV 07/22/19 w/ Dr. Lovena Le.

## 2019-04-28 ENCOUNTER — Ambulatory Visit (INDEPENDENT_AMBULATORY_CARE_PROVIDER_SITE_OTHER): Payer: Medicare Other | Admitting: Urology

## 2019-04-28 ENCOUNTER — Other Ambulatory Visit: Payer: Self-pay

## 2019-04-28 ENCOUNTER — Encounter: Payer: Self-pay | Admitting: Urology

## 2019-04-28 VITALS — BP 146/86 | HR 80 | Ht 71.0 in | Wt 232.0 lb

## 2019-04-28 DIAGNOSIS — R310 Gross hematuria: Secondary | ICD-10-CM | POA: Diagnosis not present

## 2019-04-28 DIAGNOSIS — R319 Hematuria, unspecified: Secondary | ICD-10-CM | POA: Diagnosis not present

## 2019-04-28 NOTE — Patient Instructions (Signed)
Hematuria, Adult  Hematuria is blood in the urine. Blood may be visible in the urine, or it may be identified with a test. This condition can be caused by infections of the bladder, urethra, kidney, or prostate. Other possible causes include:   Kidney stones.   Cancer of the urinary tract.   Too much calcium in the urine.   Conditions that are passed from parent to child (inherited conditions).   Exercise that requires a lot of energy.  Infections can usually be treated with medicine, and a kidney stone usually will pass through your urine. If neither of these is the cause of your hematuria, more tests may be needed to identify the cause of your symptoms.  It is very important to tell your health care provider about any blood in your urine, even if it is painless or the blood stops without treatment. Blood in the urine, when it happens and then stops and then happens again, can be a symptom of a very serious condition, including cancer. There is no pain in the initial stages of many urinary cancers.  Follow these instructions at home:  Medicines   Take over-the-counter and prescription medicines only as told by your health care provider.   If you were prescribed an antibiotic medicine, take it as told by your health care provider. Do not stop taking the antibiotic even if you start to feel better.  Eating and drinking   Drink enough fluid to keep your urine clear or pale yellow. It is recommended that you drink 3-4 quarts (2.8-3.8 L) a day. If you have been diagnosed with an infection, it is recommended that you drink cranberry juice in addition to large amounts of water.   Avoid caffeine, tea, and carbonated beverages. These tend to irritate the bladder.   Avoid alcohol because it may irritate the prostate (men).  General instructions   If you have been diagnosed with a kidney stone, follow your health care provider's instructions about straining your urine to catch the stone.   Empty your bladder  often. Avoid holding urine for long periods of time.   If you are female:  ? After a bowel movement, wipe from front to back and use each piece of toilet paper only once.  ? Empty your bladder before and after sex.   Pay attention to any changes in your symptoms. Tell your health care provider about any changes or any new symptoms.   It is your responsibility to get your test results. Ask your health care provider, or the department performing the test, when your results will be ready.   Keep all follow-up visits as told by your health care provider. This is important.  Contact a health care provider if:   You develop back pain.   You have a fever.   You have nausea or vomiting.   Your symptoms do not improve after 3 days.   Your symptoms get worse.  Get help right away if:   You develop severe vomiting and are unable take medicine without vomiting.   You develop severe pain in your back or abdomen even though you are taking medicine.   You pass a large amount of blood in your urine.   You pass blood clots in your urine.   You feel very weak or like you might faint.   You faint.  Summary   Hematuria is blood in the urine. It has many possible causes.   It is very important that you tell   your health care provider about any blood in your urine, even if it is painless or the blood stops without treatment.   Take over-the-counter and prescription medicines only as told by your health care provider.   Drink enough fluid to keep your urine clear or pale yellow.  This information is not intended to replace advice given to you by your health care provider. Make sure you discuss any questions you have with your health care provider.  Document Released: 10/30/2005 Document Revised: 12/02/2016 Document Reviewed: 12/02/2016  Elsevier Interactive Patient Education  2019 [employer]

## 2019-04-28 NOTE — Progress Notes (Signed)
   04/28/2019 10:18 AM   [name] 1951/07/20 [id number]  Referring provider: Baxter Hire, MD [place],  [place] [address]  CC: Asymptomatic microscopic hematuria  HPI: I saw [name] in urology clinic today in consultation for asymptomatic microscopic hematuria from Dr. Wynetta Emery.  She is a 68 year old co-morbid female with past medical history notable for CAD on anticoagulation, CHF, atrial fibrillation, and cardiac defibrillator placement.  She was found to have microscopic hematuria with 4-10 RBCs per high-power field on 01/08/2019, as well as 07/04/2017.  She denies any history of gross hematuria.  She is a 30-pack-year smoking history, and quit 15 years ago.  She previously worked as a Pharmacist, hospital in the school system, and denies any carcinogenic exposures.  She denies a history of recurrent urinary tract infections.  Severity is mild.  There are no aggravating or alleviating factors.   PMH: Past Medical History:  Diagnosis Date  . CHF (congestive heart failure) (HCC)    EF 30-35% BY ECHO. SHE HAS AN APICAL ANEURYSM  . Coronary artery disease 12/12/2008   STATUS POST ANTERIOR WALL MYOCARDIAL INFARCTION. STATUS POST PTCA AND STENTING OF HER LAD  . Dyslipidemia   . Transient atrial fibrillation or flutter     Surgical History: Past Surgical History:  Procedure Laterality Date  . CARDIAC DEFIBRILLATOR PLACEMENT    . ICD GENERATOR CHANGEOUT N/A 04/15/2019   Procedure: ICD GENERATOR CHANGEOUT;  Surgeon: Evans Lance, MD;  Location: [facility];  Service: Cardiovascular;  Laterality: N/A;    Allergies: No Known Allergies  Family History: Family History  Problem Relation Age of Onset  . Hypertension Mother   . Heart disease Sister   . Heart disease Brother   . Breast cancer Neg Hx     Social History:  reports that she quit smoking about 10 years ago. She has never used smokeless tobacco. She reports that she does not drink alcohol or use  drugs.  ROS: Please see flowsheet from today's date for complete review of systems.  Physical Exam: BP (!) 146/86   Pulse 80   Ht '5\' 11"'$  (1.803 m)   Wt 232 lb (105.2 kg)   BMI 32.36 kg/m    Constitutional:  Alert and oriented, No acute distress. Cardiovascular: No clubbing, cyanosis, or edema. Respiratory: Normal respiratory effort, no increased work of breathing. GI: Abdomen is soft, nontender, nondistended, no abdominal masses GU: No CVA tenderness Lymph: No cervical or inguinal lymphadenopathy. Skin: No rashes, bruises or suspicious lesions. Neurologic: Grossly intact, no focal deficits, moving all 4 extremities. Psychiatric: Normal mood and affect.  Laboratory Data: Creatinine 0.88, EGFR greater than 60  Urinalysis today 0 WBCs, 0 RBCs, no bacteria, nitrite negative  Pertinent Imaging: None to review  Assessment & Plan:   In summary, the patient is a 68 year old female with asymptomatic microscopic hematuria.  We discussed common possible etiologies of hematuria including BPH, malignancy, urolithiasis, medical renal disease, and idiopathic. Standard workup recommended by the AUA includes imaging with CT urogram to assess the upper tracts, and cystoscopy. Cytology is performed on patient's with gross hematuria to look for malignant cells in the urine.  Schedule CT urogram and cystoscopy to complete microscopic hematuria work-up  Billey Co, MD  [facility] [address] [place], [place] [address] [phone]

## 2019-04-29 LAB — MICROSCOPIC EXAMINATION
Bacteria, UA: NONE SEEN
RBC, Urine: NONE SEEN /HPF (ref 0–2)
WBC, UA: NONE SEEN /HPF (ref 0–5)

## 2019-04-29 LAB — URINALYSIS, COMPLETE
Bilirubin, UA: NEGATIVE
Glucose, UA: NEGATIVE
Ketones, UA: NEGATIVE
Leukocytes,UA: NEGATIVE
Nitrite, UA: NEGATIVE
Protein,UA: NEGATIVE
Specific Gravity, UA: 1.015 (ref 1.005–1.030)
Urobilinogen, Ur: 0.2 mg/dL (ref 0.2–1.0)
pH, UA: 6.5 (ref 5.0–7.5)

## 2019-05-08 ENCOUNTER — Ambulatory Visit: Payer: Medicare Other

## 2019-06-04 ENCOUNTER — Other Ambulatory Visit: Payer: Self-pay

## 2019-06-04 ENCOUNTER — Ambulatory Visit
Admission: RE | Admit: 2019-06-04 | Discharge: 2019-06-04 | Disposition: A | Discharge status: Home / Self Care | Payer: Medicare Other | Source: Ambulatory Visit | Attending: Urology | Admitting: Urology

## 2019-06-04 DIAGNOSIS — R319 Hematuria, unspecified: Secondary | ICD-10-CM | POA: Insufficient documentation

## 2019-06-04 HISTORY — DX: Essential (primary) hypertension: I10

## 2019-06-04 LAB — POCT I-STAT CREATININE: Creatinine, Ser: 0.8 mg/dL (ref 0.44–1.00)

## 2019-06-04 MED ORDER — IOHEXOL 300 MG/ML  SOLN
125.0000 mL | Freq: Once | INTRAMUSCULAR | Status: AC | PRN
  Administered 2019-06-04: 125 mL via INTRAVENOUS

## 2019-06-11 ENCOUNTER — Other Ambulatory Visit: Payer: Self-pay

## 2019-06-11 ENCOUNTER — Ambulatory Visit (INDEPENDENT_AMBULATORY_CARE_PROVIDER_SITE_OTHER): Payer: Medicare Other | Admitting: Urology

## 2019-06-11 ENCOUNTER — Encounter: Payer: Self-pay | Admitting: Urology

## 2019-06-11 VITALS — BP 150/83 | HR 80 | Ht 71.0 in | Wt 230.0 lb

## 2019-06-11 DIAGNOSIS — R310 Gross hematuria: Secondary | ICD-10-CM | POA: Diagnosis not present

## 2019-06-11 LAB — URINALYSIS, COMPLETE
Bilirubin, UA: NEGATIVE
Glucose, UA: NEGATIVE
Ketones, UA: NEGATIVE
Leukocytes,UA: NEGATIVE
Nitrite, UA: NEGATIVE
Protein,UA: NEGATIVE
Specific Gravity, UA: 1.02 (ref 1.005–1.030)
Urobilinogen, Ur: 1 mg/dL (ref 0.2–1.0)
pH, UA: 7 (ref 5.0–7.5)

## 2019-06-11 LAB — MICROSCOPIC EXAMINATION

## 2019-06-11 NOTE — Progress Notes (Signed)
Cystoscopy Procedure Note:  Indication: Microscopic hematuria  After informed consent and discussion of the procedure and its risks, Ader K Coe was positioned and prepped in the standard fashion. Cystoscopy was performed with a flexible cystoscope. The urethra, bladder neck and entire bladder was visualized in a standard fashion. The bladder mucosa was grossly normal throughout. The ureteral orifices were visualized in their normal location and orientation. No abnormalities on retroflexion.  Imaging: CTU reviewed, no renal masses, hydro, or urolithiasis  Findings: Normal cystoscopy  Assessment and Plan: Follow up with urology as needed RTC with PCP in 6-12 months for repeat CT chest for indeterminate 83mm lung nodule  Nickolas Madrid, MD 06/11/2019

## 2019-06-16 ENCOUNTER — Ambulatory Visit: Payer: Medicare Other | Admitting: Cardiovascular Disease

## 2019-06-25 ENCOUNTER — Encounter: Payer: Self-pay | Admitting: Cardiology

## 2019-06-25 ENCOUNTER — Ambulatory Visit (INDEPENDENT_AMBULATORY_CARE_PROVIDER_SITE_OTHER): Payer: Medicare Other | Admitting: Cardiology

## 2019-06-25 ENCOUNTER — Other Ambulatory Visit: Payer: Self-pay

## 2019-06-25 VITALS — BP 144/68 | HR 88 | Ht 71.0 in | Wt 234.8 lb

## 2019-06-25 DIAGNOSIS — I5042 Chronic combined systolic (congestive) and diastolic (congestive) heart failure: Secondary | ICD-10-CM | POA: Diagnosis not present

## 2019-06-25 DIAGNOSIS — I10 Essential (primary) hypertension: Secondary | ICD-10-CM

## 2019-06-25 DIAGNOSIS — I2510 Atherosclerotic heart disease of native coronary artery without angina pectoris: Secondary | ICD-10-CM | POA: Diagnosis not present

## 2019-06-25 DIAGNOSIS — E782 Mixed hyperlipidemia: Secondary | ICD-10-CM

## 2019-06-25 DIAGNOSIS — Z95810 Presence of automatic (implantable) cardiac defibrillator: Secondary | ICD-10-CM

## 2019-06-25 DIAGNOSIS — I480 Paroxysmal atrial fibrillation: Secondary | ICD-10-CM

## 2019-06-25 NOTE — Patient Instructions (Addendum)
Medication Instructions:  Your physician recommends that you continue on your current medications as directed. Please refer to the Current Medication list given to you today.  If you need a refill on your cardiac medications before your next appointment, please call your pharmacy.   Lab work: Your physician recommends that you return for a FASTING lipid profile: on 07/02/2019 (Lab is open from 7:30 AM to 4:30 PM   If you have labs (blood work) drawn today and your tests are completely normal, you will receive your results only by: Marland Kitchen MyChart Message (if you have MyChart) OR . A paper copy in the mail If you have any lab test that is abnormal or we need to change your treatment, we will call you to review the results.  Testing/Procedures: None   Follow-Up: At [facility], you and your health needs are our priority.  As part of our continuing mission to provide you with exceptional heart care, we have created designated Provider Care Teams.  These Care Teams include your primary Cardiologist (physician) and Advanced Practice Providers (APPs -  Physician Assistants and Nurse Practitioners) who all work together to provide you with the care you need, when you need it. You will need a follow up appointment in:  6 months.  Please call our office 2 months in advance to schedule this appointment.  You may see Mertie Moores, MD or one of the following Advanced Practice Providers on your designated Care Team: Richardson Dopp, PA-C [place], [place] . Daune Perch, NP  You are scheduled to see Dr. Lovena Le on 07/22/2019 @ 9:45 AM  Any Other Special Instructions Will Be Listed Below (If Applicable).   Lifestyle Modifications to Prevent and Treat Heart Disease -Recommend heart healthy/Mediterranean diet, with whole grains, fruits, vegetables, fish, lean meats, nuts, olive oil and avocado oil.  -Limit salt intake to less than 1500 mg per day.  -Recommend moderate walking, starting slowly with a few  minutes and working up to 3-5 times/week for 30-50 minutes each session. Aim for at least 150 minutes.week. Goal should be pace of 3 miles/hours, or walking 1.5 miles in 30 minutes -Recommend avoidance of tobacco products. Avoid excess alcohol. -Keep blood pressure well controlled, ideally less than 130/80.   =============================================   DASH Eating Plan DASH stands for "Dietary Approaches to Stop Hypertension." The DASH eating plan is a healthy eating plan that has been shown to reduce high blood pressure (hypertension). It may also reduce your risk for type 2 diabetes, heart disease, and stroke. The DASH eating plan may also help with weight loss. What are tips for following this plan?  General guidelines  Avoid eating more than 2,300 mg (milligrams) of salt (sodium) a day. If you have hypertension, you may need to reduce your sodium intake to 1,500 mg a day.  Limit alcohol intake to no more than 1 drink a day for nonpregnant women and 2 drinks a day for men. One drink equals 12 oz of beer, 5 oz of wine, or 1 oz of hard liquor.  Work with your health care provider to maintain a healthy body weight or to lose weight. Ask what an ideal weight is for you.  Get at least 30 minutes of exercise that causes your heart to beat faster (aerobic exercise) most days of the week. Activities may include walking, swimming, or biking.  Work with your health care provider or diet and nutrition specialist (dietitian) to adjust your eating plan to your individual calorie needs. Reading food  labels   Check food labels for the amount of sodium per serving. Choose foods with less than 5 percent of the Daily Value of sodium. Generally, foods with less than 300 mg of sodium per serving fit into this eating plan.  To find whole grains, look for the word "whole" as the first word in the ingredient list. Shopping  Buy products labeled as "low-sodium" or "no salt added."  Buy fresh foods.  Avoid canned foods and premade or frozen meals. Cooking  Avoid adding salt when cooking. Use salt-free seasonings or herbs instead of table salt or sea salt. Check with your health care provider or pharmacist before using salt substitutes.  Do not fry foods. Cook foods using healthy methods such as baking, boiling, grilling, and broiling instead.  Cook with heart-healthy oils, such as olive, canola, soybean, or sunflower oil. Meal planning  Eat a balanced diet that includes: ? 5 or more servings of fruits and vegetables each day. At each meal, try to fill half of your plate with fruits and vegetables. ? Up to 6-8 servings of whole grains each day. ? Less than 6 oz of lean meat, poultry, or fish each day. A 3-oz serving of meat is about the same size as a deck of cards. One egg equals 1 oz. ? 2 servings of low-fat dairy each day. ? A serving of nuts, seeds, or beans 5 times each week. ? Heart-healthy fats. Healthy fats called Omega-3 fatty acids are found in foods such as flaxseeds and coldwater fish, like sardines, salmon, and mackerel.  Limit how much you eat of the following: ? Canned or prepackaged foods. ? Food that is high in trans fat, such as fried foods. ? Food that is high in saturated fat, such as fatty meat. ? Sweets, desserts, sugary drinks, and other foods with added sugar. ? Full-fat dairy products.  Do not salt foods before eating.  Try to eat at least 2 vegetarian meals each week.  Eat more home-cooked food and less restaurant, buffet, and fast food.  When eating at a restaurant, ask that your food be prepared with less salt or no salt, if possible. What foods are recommended? The items listed may not be a complete list. Talk with your dietitian about what dietary choices are best for you. Grains Whole-grain or whole-wheat bread. Whole-grain or whole-wheat pasta. Brown rice. Orpah Cobbatmeal. Quinoa. Bulgur. Whole-grain and low-sodium cereals. Pita bread. Low-fat, low-sodium  crackers. Whole-wheat flour tortillas. Vegetables Fresh or frozen vegetables (raw, steamed, roasted, or grilled). Low-sodium or reduced-sodium tomato and vegetable juice. Low-sodium or reduced-sodium tomato sauce and tomato paste. Low-sodium or reduced-sodium canned vegetables. Fruits All fresh, dried, or frozen fruit. Canned fruit in natural juice (without added sugar). Meat and other protein foods Skinless chicken or [place]. Ground chicken or [place]. Pork with fat trimmed off. Fish and seafood. Egg whites. Dried beans, peas, or lentils. Unsalted nuts, nut butters, and seeds. Unsalted canned beans. Lean cuts of beef with fat trimmed off. Low-sodium, lean deli meat. Dairy Low-fat (1%) or fat-free (skim) milk. Fat-free, low-fat, or reduced-fat cheeses. Nonfat, low-sodium ricotta or cottage cheese. Low-fat or nonfat yogurt. Low-fat, low-sodium cheese. Fats and oils Soft margarine without trans fats. Vegetable oil. Low-fat, reduced-fat, or light mayonnaise and salad dressings (reduced-sodium). Canola, safflower, olive, soybean, and sunflower oils. Avocado. Seasoning and other foods Herbs. Spices. Seasoning mixes without salt. Unsalted popcorn and pretzels. Fat-free sweets. What foods are not recommended? The items listed may not be a complete list. Talk with  your dietitian about what dietary choices are best for you. Grains Baked goods made with fat, such as croissants, muffins, or some breads. Dry pasta or rice meal packs. Vegetables Creamed or fried vegetables. Vegetables in a cheese sauce. Regular canned vegetables (not low-sodium or reduced-sodium). Regular canned tomato sauce and paste (not low-sodium or reduced-sodium). Regular tomato and vegetable juice (not low-sodium or reduced-sodium). Rosita Fire. Olives. Fruits Canned fruit in a light or heavy syrup. Fried fruit. Fruit in cream or butter sauce. Meat and other protein foods Fatty cuts of meat. Ribs. Fried meat. Tomasa Blase. Sausage. Bologna and  other processed lunch meats. Salami. Fatback. Hotdogs. Bratwurst. Salted nuts and seeds. Canned beans with added salt. Canned or smoked fish. Whole eggs or egg yolks. Chicken or [place] with skin. Dairy Whole or 2% milk, cream, and half-and-half. Whole or full-fat cream cheese. Whole-fat or sweetened yogurt. Full-fat cheese. Nondairy creamers. Whipped toppings. Processed cheese and cheese spreads. Fats and oils Butter. Stick margarine. Lard. Shortening. Ghee. Bacon fat. Tropical oils, such as coconut, palm kernel, or palm oil. Seasoning and other foods Salted popcorn and pretzels. Onion salt, garlic salt, seasoned salt, table salt, and sea salt. Worcestershire sauce. Tartar sauce. Barbecue sauce. Teriyaki sauce. Soy sauce, including reduced-sodium. Steak sauce. Canned and packaged gravies. Fish sauce. Oyster sauce. Cocktail sauce. Horseradish that you find on the shelf. Ketchup. Mustard. Meat flavorings and tenderizers. Bouillon cubes. Hot sauce and Tabasco sauce. Premade or packaged marinades. Premade or packaged taco seasonings. Relishes. Regular salad dressings. Where to find more information:  National Heart, Lung, and Blood Institute: PopSteam.is  American Heart Association: www.heart.org Summary  The DASH eating plan is a healthy eating plan that has been shown to reduce high blood pressure (hypertension). It may also reduce your risk for type 2 diabetes, heart disease, and stroke.  With the DASH eating plan, you should limit salt (sodium) intake to 2,300 mg a day. If you have hypertension, you may need to reduce your sodium intake to 1,500 mg a day.  When on the DASH eating plan, aim to eat more fresh fruits and vegetables, whole grains, lean proteins, low-fat dairy, and heart-healthy fats.  Work with your health care provider or diet and nutrition specialist (dietitian) to adjust your eating plan to your individual calorie needs. This information is not intended to replace advice  given to you by your health care provider. Make sure you discuss any questions you have with your health care provider. Document Released: 10/19/2011 Document Revised: 10/12/2017 Document Reviewed: 10/23/2016 Elsevier Patient Education  2020 [employer].

## 2019-06-25 NOTE — Progress Notes (Signed)
Cardiology Office Note:    Date:  06/25/2019   ID:  [name], DOB 1950/11/16, MRN [id number]  PCP:  Gracelyn NurseJohnston, John D, MD  Cardiologist:  Kristeen MissPhilip Nahser, MD  Electrophysiologist: Lewayne BuntingGregg Taylor, MD  Referring MD: Gracelyn NurseJohnston, John D, MD   Chief Complaint  Patient presents with   Follow-up   Coronary Artery Disease   Cardiomyopathy   Hypertension   Atrial Fibrillation    History of Present Illness:    [name] is a 68 y.o. female with a past medical history significant for CAD s/p PCI/DES to LAD 2010 with EF of 30%, ICM, chronic systolic heart failure, hyperlipidemia, AICD and PAF. She quit smoking after her MI in 2010.  The patient had generator change out for her ICD in 04/2019. Incision is healed well.   [name] is here for 7854-month follow-up. She stays busy. She walks for 30 minutes every day and does a lot of chores around the house. She deniesa any chest pain/pressure, shortness of breath, palpitations, lightheadedness, syncope, orthopnea, PND or edema.  She has no leg pain.  Her blood pressure is a little elevated today.  She says that she was rushing around for her appointments.  She also had a sausage and egg burrito from [employer] for breakfast.  Past Medical History:  Diagnosis Date   CHF (congestive heart failure) (HCC)    EF 30-35% BY ECHO. SHE HAS AN APICAL ANEURYSM   Coronary artery disease 12/12/2008   STATUS POST ANTERIOR WALL MYOCARDIAL INFARCTION. STATUS POST PTCA AND STENTING OF HER LAD   Dyslipidemia    Hypertension    Transient atrial fibrillation or flutter     Past Surgical History:  Procedure Laterality Date   CARDIAC DEFIBRILLATOR PLACEMENT     ICD GENERATOR CHANGEOUT N/A 04/15/2019   Procedure: ICD GENERATOR CHANGEOUT;  Surgeon: Marinus Mawaylor, Gregg W, MD;  [facility];  Service: Cardiovascular;  Laterality: N/A;    Current Medications: Current Meds  Medication Sig   amiodarone (PACERONE) 200 MG tablet Take 1  tablet (200 mg total) by mouth 2 (two) times a week.   apixaban (ELIQUIS) 5 MG TABS tablet Take 1 tablet (5 mg total) by mouth 2 (two) times daily.   atorvastatin (LIPITOR) 80 MG tablet TAKE 1 TABLET BY MOUTH ONCE DAILY   carvedilol (COREG) 25 MG tablet TAKE 1 TABLET BY MOUTH TWICE DAILY WITH MEALS   clopidogrel (PLAVIX) 75 MG tablet Take 1 tablet (75 mg total) by mouth daily. Please keep upcoming appt in August with Dr. Elease HashimotoNahser for future refills. Thank you   furosemide (LASIX) 40 MG tablet TAKE 1 TABLET BY MOUTH ONCE DAILY   lisinopril (PRINIVIL,ZESTRIL) 20 MG tablet Take 1 tablet (20 mg total) by mouth daily.   Multiple Vitamin (MULTIVITAMIN WITH MINERALS) TABS tablet Take 1 tablet by mouth daily.   potassium chloride (K-DUR) 10 MEQ tablet Take 1 tablet (10 mEq total) by mouth daily. Please keep upcoming appt in August with Dr. Elease HashimotoNahser for future refills. Thank you   spironolactone (ALDACTONE) 25 MG tablet TAKE 1 TABLET BY MOUTH ONCE DAILY     Allergies:   Patient has no known allergies.   Social History   Socioeconomic History   Marital status: Widowed    Spouse name: Not on file   Number of children: Not on file   Years of education: Not on file   Highest education level: Not on file  Occupational History   Not on file  Social Network engineer strain: Not on file   Food insecurity    Worry: Not on file    Inability: Not on file   Transportation needs    Medical: Not on file    Non-medical: Not on file  Tobacco Use   Smoking status: Former Smoker    Quit date: 12/14/2008    Years since quitting: 10.5   Smokeless tobacco: Never Used  Substance and Sexual Activity   Alcohol use: No   Drug use: No   Sexual activity: Not on file  Lifestyle   Physical activity    Days per week: Not on file    Minutes per session: Not on file   Stress: Not on file  Relationships   Social connections    Talks on phone: Not on file    Gets together: Not on  file    Attends religious service: Not on file    Active member of club or organization: Not on file    Attends meetings of clubs or organizations: Not on file    Relationship status: Not on file  Other Topics Concern   Not on file  Social History Narrative   Not on file     Family History: The patient's family history includes Heart disease in her brother and sister; Hypertension in her mother. There is no history of Breast cancer. ROS:   Please see the history of present illness.     All other systems reviewed and are negative.  EKGs/Labs/Other Studies Reviewed:    The following studies were reviewed today:  Echocardiogram 08/2017: Study Conclusions - Left ventricle: The cavity size was normal. Wall thickness was normal. Systolic function was mildly reduced. The estimated ejection fraction was in the range of 45% to 50%. Akinesis of the anteroseptal myocardium. Features are consistent with a pseudonormal left ventricular filling pattern, with concomitant abnormal relaxation and increased filling pressure (grade 2 diastolic dysfunction). - Mitral valve: There was mild to moderate regurgitation. - Right atrium: The atrium was mildly dilated. - Pulmonary arteries: Systolic pressure was moderately increased. PA peak pressure: 44 mm Hg (S).  EKG:  EKG is not ordered today.    Recent Labs: 04/11/2019: BUN 12; Hemoglobin 12.2; Platelets 203; Potassium 4.1; Sodium 139 06/04/2019: Creatinine, Ser 0.80   Recent Lipid Panel    Component Value Date/Time   CHOL 155 03/12/2018 1505   TRIG 105 03/12/2018 1505   HDL 51 03/12/2018 1505   CHOLHDL 3.0 03/12/2018 1505   CHOLHDL 2.4 05/10/2016 0743   VLDL 18 05/10/2016 0743   LDLCALC 83 03/12/2018 1505    Physical Exam:    VS:  BP (!) 144/68    Pulse 88    Ht  (1.803 m)    Wt 234 lb 12.8 oz (106.5 kg)    SpO2 95%    BMI 32.75 kg/m     Wt Readings from Last 3 Encounters:  06/25/19 234 lb 12.8 oz (106.5 kg)    06/11/19 230 lb (104.3 kg)  04/28/19 232 lb (105.2 kg)     Physical Exam  Constitutional: She is oriented to person, place, and time. She appears well-developed and well-nourished. No distress.  HENT:  Head: Normocephalic and atraumatic.  Neck: Normal range of motion. Neck supple. No JVD present.  Cardiovascular: Normal rate, regular rhythm, normal heart sounds and intact distal pulses. Exam reveals no gallop and no friction rub.  No murmur heard. Pulmonary/Chest: Effort normal and breath sounds normal. No respiratory  distress. She has no wheezes. She has no rales.  Left upper chest pacemaker incision healed  Abdominal: Soft. Bowel sounds are normal.  Musculoskeletal: Normal range of motion.        General: No deformity or edema.  Neurological: She is alert and oriented to person, place, and time.  Skin: Skin is warm and dry.  Psychiatric: She has a normal mood and affect. Her behavior is normal. Judgment and thought content normal.  Vitals reviewed.    ASSESSMENT:    1. Coronary artery disease involving native coronary artery of native heart without angina pectoris   2. Chronic combined systolic and diastolic heart failure (HCC)   3. Essential (primary) hypertension   4. PAF (paroxysmal atrial fibrillation) (HCC)   5. Mixed hyperlipidemia   6. Automatic implantable cardioverter-defibrillator in situ    PLAN:    In order of problems listed above:  CAD s/p PCI/DES to LAD in 2010 -Not on aspirin due to anticoagulation with Eliquis.  She is on Plavix and statin.  Chronic combined CHF -EF down to 30% in the past, improved to 45-50% on last echo in 08/2017. -No volume overload on exam. -Continue carvedilol, lisinopril, Lasix and spironolactone. -Advised on limiting salt in her diet.  Hypertension -Lisinopril increased to 20 mg at last office visit in 08/2018.  Continues on carvedilol, spironolactone and Lasix. -Labs in 03/2019 with normal renal function and potassium. -BP  mildly elevated. Pt says because she has been rushing aroung today.  She also had a breakfast burrito from [employer] this morning.  She does not want to add any medications. We discussed salt intake and she says that she will really work on reducing her sodium intake as opposed to adding more medication.  I did advise her on a target BP of less than 130/80 to reduce her risk of future cardiovascular events. -DASH diet instruction provided.  Paroxysmal atrial fibrillation -Has been maintaining sinus rhythm on amiodarone and beta-blocker. -CAD interrogation 04/24/2019 showed 2.1% AT/AF burden. -On Eliquis for stroke risk reduction. No unusual bleeding.   Hyperlipidemia -On Lipitor 80 mg daily -LDL 83 on last lipid check 02/2018 -We will update lipids today  AICD in situ -Followed by Dr. Ladona Ridgelaylor -Patient had a generator change out 04/15/2019   Medication Adjustments/Labs and Tests Ordered: Current medicines are reviewed at length with the patient today.  Concerns regarding medicines are outlined above. Labs and tests ordered and medication changes are outlined in the patient instructions below:  Patient Instructions  Medication Instructions:  Your physician recommends that you continue on your current medications as directed. Please refer to the Current Medication list given to you today.  If you need a refill on your cardiac medications before your next appointment, please call your pharmacy.   Lab work: Your physician recommends that you return for a FASTING lipid profile:   If you have labs (blood work) drawn today and your tests are completely normal, you will receive your results only by:  MyChart Message (if you have MyChart) OR  A paper copy in the mail If you have any lab test that is abnormal or we need to change your treatment, we will call you to review the results.  Testing/Procedures: None   Follow-Up: At [facility], you and your health needs are our priority.  As  part of our continuing mission to provide you with exceptional heart care, we have created designated Provider Care Teams.  These Care Teams include your primary Cardiologist (physician) and Advanced  Practice Providers (APPs -  Physician Assistants and Nurse Practitioners) who all work together to provide you with the care you need, when you need it. You will need a follow up appointment in:  6 months.  Please call our office 2 months in advance to schedule this appointment.  You may see Kristeen MissPhilip Nahser, MD or one of the following Advanced Practice Providers on your designated Care Team: Tereso NewcomerScott Weaver, PA-C Vin Clyde ParkBhagat, [place] JerseyPA-C  Berton BonJanine Vertie Dibbern, [place]  You are scheduled to see Dr. Ladona Ridgelaylor on 07/22/2019 @  Any Other Special Instructions Will Be Listed Below (If Applicable).   Lifestyle Modifications to Prevent and Treat Heart Disease -Recommend heart healthy/Mediterranean diet, with whole grains, fruits, vegetables, fish, lean meats, nuts, olive oil and avocado oil.  -Limit salt intake to less than 1500 mg per day.  -Recommend moderate walking, starting slowly with a few minutes and working up to 3-5 times/week for 30-50 minutes each session. Aim for at least 150 minutes.week. Goal should be pace of 3 miles/hours, or walking 1.5 miles in 30 minutes -Recommend avoidance of tobacco products. Avoid excess alcohol. -Keep blood pressure well controlled, ideally less than 130/80.   =============================================   DASH Eating Plan DASH stands for "Dietary Approaches to Stop Hypertension." The DASH eating plan is a healthy eating plan that has been shown to reduce high blood pressure (hypertension). It may also reduce your risk for type 2 diabetes, heart disease, and stroke. The DASH eating plan may also help with weight loss. What are tips for following this plan?  General guidelines  Avoid eating more than 2,300 mg (milligrams) of salt (sodium) a day. If you have hypertension, you may need to  reduce your sodium intake to 1,500 mg a day.  Limit alcohol intake to no more than 1 drink a day for nonpregnant women and 2 drinks a day for men. One drink equals 12 oz of beer, 5 oz of wine, or 1 oz of hard liquor.  Work with your health care provider to maintain a healthy body weight or to lose weight. Ask what an ideal weight is for you.  Get at least 30 minutes of exercise that causes your heart to beat faster (aerobic exercise) most days of the week. Activities may include walking, swimming, or biking.  Work with your health care provider or diet and nutrition specialist (dietitian) to adjust your eating plan to your individual calorie needs. Reading food labels   Check food labels for the amount of sodium per serving. Choose foods with less than 5 percent of the Daily Value of sodium. Generally, foods with less than 300 mg of sodium per serving fit into this eating plan.  To find whole grains, look for the word "whole" as the first word in the ingredient list. Shopping  Buy products labeled as "low-sodium" or "no salt added."  Buy fresh foods. Avoid canned foods and premade or frozen meals. Cooking  Avoid adding salt when cooking. Use salt-free seasonings or herbs instead of table salt or sea salt. Check with your health care provider or pharmacist before using salt substitutes.  Do not fry foods. Cook foods using healthy methods such as baking, boiling, grilling, and broiling instead.  Cook with heart-healthy oils, such as olive, canola, soybean, or sunflower oil. Meal planning  Eat a balanced diet that includes: ? 5 or more servings of fruits and vegetables each day. At each meal, try to fill half of your plate with fruits and vegetables. ? Up to 6-8  servings of whole grains each day. ? Less than 6 oz of lean meat, poultry, or fish each day. A 3-oz serving of meat is about the same size as a deck of cards. One egg equals 1 oz. ? 2 servings of low-fat dairy each day. ? A  serving of nuts, seeds, or beans 5 times each week. ? Heart-healthy fats. Healthy fats called Omega-3 fatty acids are found in foods such as flaxseeds and coldwater fish, like sardines, salmon, and mackerel.  Limit how much you eat of the following: ? Canned or prepackaged foods. ? Food that is high in trans fat, such as fried foods. ? Food that is high in saturated fat, such as fatty meat. ? Sweets, desserts, sugary drinks, and other foods with added sugar. ? Full-fat dairy products.  Do not salt foods before eating.  Try to eat at least 2 vegetarian meals each week.  Eat more home-cooked food and less restaurant, buffet, and fast food.  When eating at a restaurant, ask that your food be prepared with less salt or no salt, if possible. What foods are recommended? The items listed may not be a complete list. Talk with your dietitian about what dietary choices are best for you. Grains Whole-grain or whole-wheat bread. Whole-grain or whole-wheat pasta. Brown rice. Orpah Cobb. Bulgur. Whole-grain and low-sodium cereals. Pita bread. Low-fat, low-sodium crackers. Whole-wheat flour tortillas. Vegetables Fresh or frozen vegetables (raw, steamed, roasted, or grilled). Low-sodium or reduced-sodium tomato and vegetable juice. Low-sodium or reduced-sodium tomato sauce and tomato paste. Low-sodium or reduced-sodium canned vegetables. Fruits All fresh, dried, or frozen fruit. Canned fruit in natural juice (without added sugar). Meat and other protein foods Skinless chicken or [place]. Ground chicken or [place]. Pork with fat trimmed off. Fish and seafood. Egg whites. Dried beans, peas, or lentils. Unsalted nuts, nut butters, and seeds. Unsalted canned beans. Lean cuts of beef with fat trimmed off. Low-sodium, lean deli meat. Dairy Low-fat (1%) or fat-free (skim) milk. Fat-free, low-fat, or reduced-fat cheeses. Nonfat, low-sodium ricotta or cottage cheese. Low-fat or nonfat yogurt. Low-fat,  low-sodium cheese. Fats and oils Soft margarine without trans fats. Vegetable oil. Low-fat, reduced-fat, or light mayonnaise and salad dressings (reduced-sodium). Canola, safflower, olive, soybean, and sunflower oils. Avocado. Seasoning and other foods Herbs. Spices. Seasoning mixes without salt. Unsalted popcorn and pretzels. Fat-free sweets. What foods are not recommended? The items listed may not be a complete list. Talk with your dietitian about what dietary choices are best for you. Grains Baked goods made with fat, such as croissants, muffins, or some breads. Dry pasta or rice meal packs. Vegetables Creamed or fried vegetables. Vegetables in a cheese sauce. Regular canned vegetables (not low-sodium or reduced-sodium). Regular canned tomato sauce and paste (not low-sodium or reduced-sodium). Regular tomato and vegetable juice (not low-sodium or reduced-sodium). Rosita Fire. Olives. Fruits Canned fruit in a light or heavy syrup. Fried fruit. Fruit in cream or butter sauce. Meat and other protein foods Fatty cuts of meat. Ribs. Fried meat. Tomasa Blase. Sausage. Bologna and other processed lunch meats. Salami. Fatback. Hotdogs. Bratwurst. Salted nuts and seeds. Canned beans with added salt. Canned or smoked fish. Whole eggs or egg yolks. Chicken or [place] with skin. Dairy Whole or 2% milk, cream, and half-and-half. Whole or full-fat cream cheese. Whole-fat or sweetened yogurt. Full-fat cheese. Nondairy creamers. Whipped toppings. Processed cheese and cheese spreads. Fats and oils Butter. Stick margarine. Lard. Shortening. Ghee. Bacon fat. Tropical oils, such as coconut, palm kernel, or palm oil. Seasoning and other  foods Salted popcorn and pretzels. Onion salt, garlic salt, seasoned salt, table salt, and sea salt. Worcestershire sauce. Tartar sauce. Barbecue sauce. Teriyaki sauce. Soy sauce, including reduced-sodium. Steak sauce. Canned and packaged gravies. Fish sauce. Oyster sauce. Cocktail sauce.  Horseradish that you find on the shelf. Ketchup. Mustard. Meat flavorings and tenderizers. Bouillon cubes. Hot sauce and Tabasco sauce. Premade or packaged marinades. Premade or packaged taco seasonings. Relishes. Regular salad dressings. Where to find more information:  National Heart, Lung, and [place]: https://wilson-eaton.com/  American Heart Association: www.heart.org Summary  The DASH eating plan is a healthy eating plan that has been shown to reduce high blood pressure (hypertension). It may also reduce your risk for type 2 diabetes, heart disease, and stroke.  With the DASH eating plan, you should limit salt (sodium) intake to 2,300 mg a day. If you have hypertension, you may need to reduce your sodium intake to 1,500 mg a day.  When on the DASH eating plan, aim to eat more fresh fruits and vegetables, whole grains, lean proteins, low-fat dairy, and heart-healthy fats.  Work with your health care provider or diet and nutrition specialist (dietitian) to adjust your eating plan to your individual calorie needs. This information is not intended to replace advice given to you by your health care provider. Make sure you discuss any questions you have with your health care provider. Document Released: 10/19/2011 Document Revised: 10/12/2017 Document Reviewed: 10/23/2016 Elsevier Patient Education  2020 [place], Daune Perch, NP  06/25/2019 10:50 AM    [place]

## 2019-07-02 ENCOUNTER — Other Ambulatory Visit: Payer: Medicare Other | Admitting: *Deleted

## 2019-07-02 ENCOUNTER — Other Ambulatory Visit: Payer: Self-pay

## 2019-07-02 DIAGNOSIS — E782 Mixed hyperlipidemia: Secondary | ICD-10-CM

## 2019-07-02 LAB — LIPID PANEL
Chol/HDL Ratio: 3 ratio (ref 0.0–4.4)
Cholesterol, Total: 140 mg/dL (ref 100–199)
HDL: 46 mg/dL (ref >39)
LDL Calculated: 68 mg/dL (ref 0–99)
Triglycerides: 132 mg/dL (ref 0–149)
VLDL Cholesterol Cal: 26 mg/dL (ref 5–40)

## 2019-07-08 ENCOUNTER — Other Ambulatory Visit: Payer: Self-pay | Admitting: Cardiovascular Disease

## 2019-07-22 ENCOUNTER — Other Ambulatory Visit: Payer: Self-pay

## 2019-07-22 ENCOUNTER — Ambulatory Visit (INDEPENDENT_AMBULATORY_CARE_PROVIDER_SITE_OTHER): Payer: Medicare Other | Admitting: *Deleted

## 2019-07-22 ENCOUNTER — Other Ambulatory Visit: Payer: Self-pay | Admitting: Cardiovascular Disease

## 2019-07-22 ENCOUNTER — Ambulatory Visit (INDEPENDENT_AMBULATORY_CARE_PROVIDER_SITE_OTHER): Payer: Medicare Other | Admitting: Internal Medicine

## 2019-07-22 ENCOUNTER — Encounter: Payer: Self-pay | Admitting: Internal Medicine

## 2019-07-22 VITALS — BP 140/84 | HR 72 | Ht 71.0 in | Wt 230.0 lb

## 2019-07-22 DIAGNOSIS — I480 Paroxysmal atrial fibrillation: Secondary | ICD-10-CM | POA: Diagnosis not present

## 2019-07-22 DIAGNOSIS — Z95810 Presence of automatic (implantable) cardiac defibrillator: Secondary | ICD-10-CM

## 2019-07-22 LAB — CUP PACEART REMOTE DEVICE CHECK
Battery Remaining Longevity: 136 mo
Battery Voltage: 3.13 V
Brady Statistic RV Percent Paced: 0.01 %
Date Time Interrogation Session: 20200908073425
HighPow Impedance: 52 Ohm
HighPow Impedance: 60 Ohm
Implantable Lead Implant Date: 20110627
Implantable Lead Location: 753860
Implantable Lead Model: 6947
Implantable Pulse Generator Implant Date: 20200602
Lead Channel Impedance Value: 380 Ohm
Lead Channel Impedance Value: 456 Ohm
Lead Channel Pacing Threshold Amplitude: 0.75 V
Lead Channel Pacing Threshold Pulse Width: 0.4 ms
Lead Channel Sensing Intrinsic Amplitude: 10.875 mV
Lead Channel Sensing Intrinsic Amplitude: 10.875 mV
Lead Channel Setting Pacing Amplitude: 2.5 V
Lead Channel Setting Pacing Pulse Width: 0.4 ms
Lead Channel Setting Sensing Sensitivity: 0.3 mV

## 2019-07-22 NOTE — Progress Notes (Signed)
HPI [name] returns today for followup. She is a pleasant 68 yo woman with an ICM, chronic systolic heart failure, PAF, and s/p ICD insertion. She underwent gen change out several months ago. She denies chest pain or sob but she notes that she has not been quite as active and has gained a few pounds.  No Known Allergies   Current Outpatient Medications  Medication Sig Dispense Refill  . amiodarone (PACERONE) 200 MG tablet Take 1 tablet (200 mg total) by mouth 2 (two) times a week. 24 tablet 3  . apixaban (ELIQUIS) 5 MG TABS tablet Take 1 tablet (5 mg total) by mouth 2 (two) times daily. 60 tablet 5  . atorvastatin (LIPITOR) 80 MG tablet TAKE 1 TABLET BY MOUTH ONCE DAILY 90 tablet 3  . carvedilol (COREG) 25 MG tablet TAKE 1 TABLET BY MOUTH TWICE DAILY WITH MEALS 180 tablet 3  . clopidogrel (PLAVIX) 75 MG tablet Take 1 tablet (75 mg total) by mouth daily. 90 tablet 3  . furosemide (LASIX) 40 MG tablet TAKE 1 TABLET BY MOUTH ONCE DAILY 90 tablet 3  . lisinopril (PRINIVIL,ZESTRIL) 20 MG tablet Take 1 tablet (20 mg total) by mouth daily. 90 tablet 3  . Multiple Vitamin (MULTIVITAMIN WITH MINERALS) TABS tablet Take 1 tablet by mouth daily.    . potassium chloride (K-DUR) 10 MEQ tablet Take 1 tablet (10 mEq total) by mouth daily. Please keep upcoming appt in August with Dr. Elease Hashimoto for future refills. Thank you 90 tablet 0  . spironolactone (ALDACTONE) 25 MG tablet TAKE 1 TABLET BY MOUTH ONCE DAILY 90 tablet 3   No current facility-administered medications for this visit.      Past Medical History:  Diagnosis Date  . CHF (congestive heart failure) (HCC)    EF 30-35% BY ECHO. SHE HAS AN APICAL ANEURYSM  . Coronary artery disease 12/12/2008   STATUS POST ANTERIOR WALL MYOCARDIAL INFARCTION. STATUS POST PTCA AND STENTING OF HER LAD  . Dyslipidemia   . Hypertension   . Transient atrial fibrillation or flutter     ROS:   All systems reviewed and negative except as noted in the  HPI.   Past Surgical History:  Procedure Laterality Date  . CARDIAC DEFIBRILLATOR PLACEMENT    . ICD GENERATOR CHANGEOUT N/A 04/15/2019   Procedure: ICD GENERATOR CHANGEOUT;  Surgeon: Marinus Maw, MD;  [facility] LAB;  Service: Cardiovascular;  Laterality: N/A;     Family History  Problem Relation Age of Onset  . Hypertension Mother   . Heart disease Sister   . Heart disease Brother   . Breast cancer Neg Hx      Social History   Socioeconomic History  . Marital status: Widowed    Spouse name: Not on file  . Number of children: Not on file  . Years of education: Not on file  . Highest education level: Not on file  Occupational History  . Not on file  Social Needs  . Financial resource strain: Not on file  . Food insecurity    Worry: Not on file    Inability: Not on file  . Transportation needs    Medical: Not on file    Non-medical: Not on file  Tobacco Use  . Smoking status: Former Smoker    Quit date: 12/14/2008    Years since quitting: 10.6  . Smokeless tobacco: Never Used  Substance and Sexual Activity  . Alcohol use: No  .  Drug use: No  . Sexual activity: Not on file  Lifestyle  . Physical activity    Days per week: Not on file    Minutes per session: Not on file  . Stress: Not on file  Relationships  . Social Herbalist on phone: Not on file    Gets together: Not on file    Attends religious service: Not on file    Active member of club or organization: Not on file    Attends meetings of clubs or organizations: Not on file    Relationship status: Not on file  . Intimate partner violence    Fear of current or ex partner: Not on file    Emotionally abused: Not on file    Physically abused: Not on file    Forced sexual activity: Not on file  Other Topics Concern  . Not on file  Social History Narrative  . Not on file     BP 140/84   Pulse 72   Ht 5\' 11"  (1.803 m)   Wt 230 lb (104.3 kg)   SpO2 98%   BMI 32.08 kg/m    Physical Exam:  Well appearing NAD HEENT: Unremarkable Neck:  No JVD, no thyromegally Lymphatics:  No adenopathy Back:  No CVA tenderness Lungs:  Clear with no wheezes HEART:  Regular rate rhythm, no murmurs, no rubs, no clicks Abd:  soft, positive bowel sounds, no organomegally, no rebound, no guarding Ext:  2 plus pulses, no edema, no cyanosis, no clubbing Skin:  No rashes no nodules Neuro:  CN II through XII intact, motor grossly intact  EKG - nsr with septal MI  DEVICE  Normal device function.  See PaceArt for details.   Assess/Plan: 1. Chronic systolic heart failure -her symptoms are class 2. She is encouraged to increase her physical activity. 2. HTN - her bp is a little high today. I asked her to try and lose weight. 3. ICD - her Medtronic DDD ICD is working normally. We will recheck in several months.  Mikle Bosworth.D.

## 2019-07-22 NOTE — Patient Instructions (Signed)
Medication Instructions:  none If you need a refill on your cardiac medications before your next appointment, please call your pharmacy.   Lab work: none If you have labs (blood work) drawn today and your tests are completely normal, you will receive your results only by: . MyChart Message (if you have MyChart) OR . A paper copy in the mail If you have any lab test that is abnormal or we need to change your treatment, we will call you to review the results.  Testing/Procedures: none  Follow-Up: At [facility], you and your health needs are our priority.  As part of our continuing mission to provide you with exceptional heart care, we have created designated Provider Care Teams.  These Care Teams include your primary Cardiologist (physician) and Advanced Practice Providers (APPs -  Physician Assistants and Nurse Practitioners) who all work together to provide you with the care you need, when you need it. You will need a follow up appointment in 1 years.  Please call our office 2 months in advance to schedule this appointment.  You may see Gregg Taylor, MD or one of the following Advanced Practice Providers on your designated Care Team:   Amber Seiler, NP . Renee Ursuy, PA-C  Any Other Special Instructions Will Be Listed Below (If Applicable).    

## 2019-08-05 ENCOUNTER — Other Ambulatory Visit: Payer: Self-pay | Admitting: Cardiovascular Disease

## 2019-08-05 NOTE — Telephone Encounter (Signed)
60f 104.3kg Scr 0.8 06/04/19 Lovw/taylor 07/22/19

## 2019-08-06 ENCOUNTER — Encounter: Payer: Self-pay | Admitting: Cardiology

## 2019-08-06 NOTE — Progress Notes (Signed)
Remote ICD transmission.   

## 2019-09-09 ENCOUNTER — Other Ambulatory Visit: Payer: Self-pay | Admitting: Cardiovascular Disease

## 2019-09-09 ENCOUNTER — Other Ambulatory Visit: Payer: Self-pay | Admitting: Medical

## 2019-09-30 ENCOUNTER — Other Ambulatory Visit: Payer: Self-pay | Admitting: Cardiovascular Disease

## 2019-10-01 ENCOUNTER — Other Ambulatory Visit: Payer: Self-pay | Admitting: Internal Medicine

## 2019-10-01 DIAGNOSIS — Z1231 Encounter for screening mammogram for malignant neoplasm of breast: Secondary | ICD-10-CM

## 2019-10-21 ENCOUNTER — Ambulatory Visit (INDEPENDENT_AMBULATORY_CARE_PROVIDER_SITE_OTHER): Payer: Medicare Other | Admitting: *Deleted

## 2019-10-21 ENCOUNTER — Other Ambulatory Visit: Payer: Self-pay | Admitting: Cardiovascular Disease

## 2019-10-21 DIAGNOSIS — I5022 Chronic systolic (congestive) heart failure: Secondary | ICD-10-CM

## 2019-10-21 LAB — CUP PACEART REMOTE DEVICE CHECK
Battery Remaining Longevity: 135 mo
Battery Voltage: 3.08 V
Brady Statistic RV Percent Paced: 0.01 %
Date Time Interrogation Session: 20201208022705
HighPow Impedance: 53 Ohm
HighPow Impedance: 68 Ohm
Implantable Lead Implant Date: 20110627
Implantable Lead Location: 753860
Implantable Lead Model: 6947
Implantable Pulse Generator Implant Date: 20200602
Lead Channel Impedance Value: 399 Ohm
Lead Channel Impedance Value: 513 Ohm
Lead Channel Pacing Threshold Amplitude: 0.75 V
Lead Channel Pacing Threshold Pulse Width: 0.4 ms
Lead Channel Sensing Intrinsic Amplitude: 8.625 mV
Lead Channel Sensing Intrinsic Amplitude: 8.625 mV
Lead Channel Setting Pacing Amplitude: 2.5 V
Lead Channel Setting Pacing Pulse Width: 0.4 ms
Lead Channel Setting Sensing Sensitivity: 0.3 mV

## 2019-11-11 ENCOUNTER — Other Ambulatory Visit: Payer: Self-pay | Admitting: Cardiovascular Disease

## 2019-11-11 ENCOUNTER — Ambulatory Visit
Admission: RE | Admit: 2019-11-11 | Discharge: 2019-11-11 | Disposition: A | Discharge status: Home / Self Care | Payer: Medicare Other | Source: Ambulatory Visit | Attending: Internal Medicine | Admitting: Internal Medicine

## 2019-11-11 DIAGNOSIS — Z1231 Encounter for screening mammogram for malignant neoplasm of breast: Secondary | ICD-10-CM | POA: Insufficient documentation

## 2019-11-11 DIAGNOSIS — I2510 Atherosclerotic heart disease of native coronary artery without angina pectoris: Secondary | ICD-10-CM

## 2019-11-21 NOTE — Progress Notes (Signed)
ICD remote 

## 2019-12-22 ENCOUNTER — Encounter: Payer: Self-pay | Admitting: Cardiovascular Disease

## 2019-12-22 NOTE — Progress Notes (Signed)
Cardiology Office Note   Date:  12/23/2019   ID:  Davis, Felicia 1950/11/29, MRN [id number]  PCP:  Baxter Hire, MD  Cardiologist:   Mertie Moores, MD   Chief Complaint  Patient presents with  . Coronary Artery Disease  . Hyperlipidemia  . Atrial Fibrillation   1. Coronary artery disease-status post anterior wall myocardial infarction. She status post stenting of her LAD in her Jan, 2010. We placed a 3.0 x 18 mm vision stent. Was post dilated with a 3.25 mm  Voyager 2. Chronic combined systolic and diastolic congestive heart failure: with EF of 30-35% associated with an apical aneurysm.   EF has improved to 45-50%.  3. Status post AICD placement  4. Paroxysmal atrial fibrillation 5. Dyslipidemia 6.   [name] is a 69 year old female with a history of coronary artery disease. She status post anterior wall myocardial infarction. She has congestive heart failure with an ejection fraction of around 35%.  She's done very well since I last saw her. He is exercising on a fairly regular basis. She's not had any episodes of chest or shortness breath. She has done well. - walks every day. No chest pain or dyspnea.   She has not been as careful with her diet recently. She admits that she needs to work on a good low-fat, low carbohydrate diet.  April 15, 2013:  [name] is doing well. No CP or dyspnea. Still walks every day ( 3/4 of a mile). She is due to have an echo.   Dec. 2, 2014:  [name] is doing well. No CP or dyspnea. Still walking (3 laps around the mall Or 30 minutes) Every other day. Echocardiogram in June, 2014 sutures a persistently reduced left ventricular systolic function with an EF of around 30%. She has mild pulmonary hypertension with estimated PA pressure of 40. Left ventricle: Akinesis septum. Severe hypokinesis of the apex. The cavity size was normal. Wall thickness was increased in a pattern of mild LVH. The estimated ejection fraction was 30%.  Findings consistent with left ventricular diastolic dysfunction. - Mitral valve: Mild regurgitation. - Left atrium: The atrium was mildly dilated. - Pulmonary arteries: PA peak pressure: 26mm Hg    April 22, 2014:  [name] feels nervous today. HR and BP are up. She denies any chest pain or shortness of breath. She has had atrial fibrillation before. EKG today reveals atrial fibrillation with a ventricular rate of 136.  July 01, 2014:  [name] is doing . She's not having episodes of chest pain or shortness of breath. She's able to get out and exercise on a fairly regular basis.    January 27, 2015:   [name] is a 69 y.o. female who presents for her CAD. Doing  Well.  Exercising regulalry.   No CP with exercise.  Has lost some weight.   20 labs.  Checks her BP regularly  - its ok at home.  Watches her salt  Sept. 14, 2016: Doing well.   No CP , no dyspnea.  Exercising regularly, watching her diet Her husband died of a cardiac arrest on 2023/04/14.  She is still upset   February 02, 2016:  Walking every day  No CP .  Has not been able to lose weight .   Sept. 20, 2017 Feeling well.  No CP or dyspnea Getting some exercise   March 21,2018:    Doing well.  No CP , feeling well  Is working out at the  gym now.  No CP or dyspnea  Oct. 23, 2018:  No CP or dyspnea  No palpitations Feeling well No syncope or presyncope Working out regularly   March 12, 2018:  [name] is seen today for follow-up visit.  She has a history of coronary artery disease and congestive heart failure.  She also has paroxysmal atrial fibrillation.  She has a history of hyperlipidemia. Her sister diet recently at age 74.  No CP or dyspnea  Getting some exercise   BP has been up related to stress of sisters death     11/30/2019:  [name] is seen today for follow-up of her coronary artery disease, hyperlipidemia, congestive heart failure.  She also has a history of paroxysmal atrial  fibrillation. Her last echocardiogram was October, 2018.  At that time her left ventricular systolic function had improved to 45 to 50%.  She still has akinesis of the anteroseptal myocardium.  She has mild to moderate mitral regurgitation. She has moderate pulmonary hypertension with an estimated PA pressure of 44 mmHg. Staying healthy,  Still fairly active Wt is 225 lbs.  VS looks good.  still eating sweets  Discussed avoiding " white, wheat, and sweats"     Past Medical History:  Diagnosis Date  . CHF (congestive heart failure) (HCC)    EF 30-35% BY ECHO. SHE HAS AN APICAL ANEURYSM  . Coronary artery disease 12/12/2008   STATUS POST ANTERIOR WALL MYOCARDIAL INFARCTION. STATUS POST PTCA AND STENTING OF HER LAD  . Dyslipidemia   . Hypertension   . Transient atrial fibrillation or flutter     Past Surgical History:  Procedure Laterality Date  . CARDIAC DEFIBRILLATOR PLACEMENT    . ICD GENERATOR CHANGEOUT N/A 04/15/2019   Procedure: ICD GENERATOR CHANGEOUT;  Surgeon: Marinus Maw, MD;  Location: [facility] INVASIVE CV LAB;  Service: Cardiovascular;  Laterality: N/A;     Current Outpatient Medications  Medication Sig Dispense Refill  . amiodarone (PACERONE) 200 MG tablet Take 1 tablet (200 mg total) by mouth 2 (two) times a week. 24 tablet 3  . atorvastatin (LIPITOR) 80 MG tablet Take 1 tablet by mouth once daily 90 tablet 0  . carvedilol (COREG) 25 MG tablet TAKE 1 TABLET BY MOUTH TWICE DAILY WITH MEALS 180 tablet 1  . ELIQUIS 5 MG TABS tablet Take 1 tablet by mouth twice daily 180 tablet 2  . furosemide (LASIX) 40 MG tablet Take 1 tablet by mouth once daily 90 tablet 0  . lisinopril (ZESTRIL) 20 MG tablet Take 1 tablet by mouth once daily 90 tablet 3  . Multiple Vitamin (MULTIVITAMIN WITH MINERALS) TABS tablet Take 1 tablet by mouth daily.    . potassium chloride (KLOR-CON) 10 MEQ tablet Take 1 tablet by mouth once daily 90 tablet 0  . spironolactone (ALDACTONE) 25 MG tablet Take 1  tablet by mouth once daily 90 tablet 2   No current facility-administered medications for this visit.    Allergies:   Patient has no known allergies.    Social History:  The patient  reports that she quit smoking about 11 years ago. She has never used smokeless tobacco. She reports that she does not drink alcohol or use drugs.   Family History:  The patient's family history includes Heart disease in her brother and sister; Hypertension in her mother.    ROS:  Noted in current history.  Otherwise review of systems is negative   Physical Exam: Blood pressure 132/72, pulse 75, height 5'  11" (1.803 m), weight 225 lb (102.1 kg), SpO2 99 %.  GEN:  Well nourished, well developed in no acute distress HEENT: Normal NECK: No JVD; No carotid bruits LYMPHATICS: No lymphadenopathy CARDIAC: RRR  RESPIRATORY:  Clear to auscultation without rales, wheezing or rhonchi  ABDOMEN: Soft, non-tender, non-distended MUSCULOSKELETAL:  No edema; No deformity  SKIN: Warm and dry NEUROLOGIC:  Alert and oriented x 3    EKG:    Recent Labs: 04/11/2019: BUN 12; Hemoglobin 12.2; Platelets 203; Potassium 4.1; Sodium 139 06/04/2019: Creatinine, Ser 0.80    Lipid Panel    Component Value Date/Time   CHOL 140 07/02/2019 0818   TRIG 132 07/02/2019 0818   HDL 46 07/02/2019 0818   CHOLHDL 3.0 07/02/2019 0818   CHOLHDL 2.4 05/10/2016 0743   VLDL 18 05/10/2016 0743   LDLCALC 68 07/02/2019 0818      Wt Readings from Last 3 Encounters:  12/23/19 225 lb (102.1 kg)  07/22/19 230 lb (104.3 kg)  06/25/19 234 lb 12.8 oz (106.5 kg)      Other studies Reviewed: Additional studies/ records that were reviewed today include: . Review of the above records demonstrates:    ASSESSMENT AND PLAN:  1. Coronary artery disease-   No angina ,  Encouraged her to cont walking   2. Congestive heart failure :   Doing well.  Cont meds.   3. Status post AICD placement -  Managed by Dr. Ladona Ridgel   4. Paroxysmal  atrial fibrillation -   Is maintaining NSR   5. Dyslipidemia -    Lipids looked ok in Aug, 2020.   Check labs today      Current medicines are reviewed at length with the patient today.  The patient does not have concerns regarding medicines.  The following changes have been made:  no change  Labs/ tests ordered today include:   Orders Placed This Encounter  Procedures  . Lipid Profile  . Hepatic function panel  . Basic Metabolic Panel (BMET)     Disposition:   FU with an APP in 6 months     Signed, Kristeen Miss, MD  12/23/2019 9:53 AM    [facility] [address] [place], [place], [place]  [address] Phone: [phone]; Fax: [phone]

## 2019-12-23 ENCOUNTER — Other Ambulatory Visit: Payer: Self-pay

## 2019-12-23 ENCOUNTER — Ambulatory Visit (INDEPENDENT_AMBULATORY_CARE_PROVIDER_SITE_OTHER): Payer: Medicare PPO | Admitting: Cardiovascular Disease

## 2019-12-23 ENCOUNTER — Encounter: Payer: Self-pay | Admitting: Cardiovascular Disease

## 2019-12-23 VITALS — BP 132/72 | HR 75 | Ht 71.0 in | Wt 225.0 lb

## 2019-12-23 DIAGNOSIS — I10 Essential (primary) hypertension: Secondary | ICD-10-CM

## 2019-12-23 DIAGNOSIS — I2510 Atherosclerotic heart disease of native coronary artery without angina pectoris: Secondary | ICD-10-CM

## 2019-12-23 DIAGNOSIS — I5042 Chronic combined systolic (congestive) and diastolic (congestive) heart failure: Secondary | ICD-10-CM

## 2019-12-23 DIAGNOSIS — E782 Mixed hyperlipidemia: Secondary | ICD-10-CM

## 2019-12-23 DIAGNOSIS — Z5181 Encounter for therapeutic drug level monitoring: Secondary | ICD-10-CM | POA: Diagnosis not present

## 2019-12-23 LAB — HEPATIC FUNCTION PANEL
ALT: 19 IU/L (ref 0–32)
AST: 17 IU/L (ref 0–40)
Albumin: 4.5 g/dL (ref 3.8–4.8)
Alkaline Phosphatase: 113 IU/L (ref 39–117)
Bilirubin Total: 0.5 mg/dL (ref 0.0–1.2)
Bilirubin, Direct: 0.13 mg/dL (ref 0.00–0.40)
Total Protein: 7 g/dL (ref 6.0–8.5)

## 2019-12-23 LAB — BASIC METABOLIC PANEL
BUN/Creatinine Ratio: 20 (ref 12–28)
BUN: 20 mg/dL (ref 8–27)
CO2: 24 mmol/L (ref 20–29)
Calcium: 9.7 mg/dL (ref 8.7–10.3)
Chloride: 98 mmol/L (ref 96–106)
Creatinine, Ser: 1.02 mg/dL — ABNORMAL HIGH (ref 0.57–1.00)
GFR calc Af Amer: 65 mL/min/{1.73_m2} (ref >59)
GFR calc non Af Amer: 57 mL/min/{1.73_m2} — ABNORMAL LOW (ref >59)
Glucose: 132 mg/dL — ABNORMAL HIGH (ref 65–99)
Potassium: 4.3 mmol/L (ref 3.5–5.2)
Sodium: 139 mmol/L (ref 134–144)

## 2019-12-23 LAB — LIPID PANEL
Chol/HDL Ratio: 2.7 ratio (ref 0.0–4.4)
Cholesterol, Total: 137 mg/dL (ref 100–199)
HDL: 50 mg/dL (ref >39)
LDL Chol Calc (NIH): 67 mg/dL (ref 0–99)
Triglycerides: 109 mg/dL (ref 0–149)
VLDL Cholesterol Cal: 20 mg/dL (ref 5–40)

## 2019-12-23 NOTE — Patient Instructions (Signed)
Medication Instructions:  Your physician recommends that you continue on your current medications as directed. Please refer to the Current Medication list given to you today.  *If you need a refill on your cardiac medications before your next appointment, please call your pharmacy*  Lab Work: TODAY - cholesterol, liver panel, basic metabolic panel If you have labs (blood work) drawn today and your tests are completely normal, you will receive your results only by: . MyChart Message (if you have MyChart) OR . A paper copy in the mail If you have any lab test that is abnormal or we need to change your treatment, we will call you to review the results.   Testing/Procedures: None Ordered   Follow-Up: At [facility], you and your health needs are our priority.  As part of our continuing mission to provide you with exceptional heart care, we have created designated Provider Care Teams.  These Care Teams include your primary Cardiologist (physician) and Advanced Practice Providers (APPs -  Physician Assistants and Nurse Practitioners) who all work together to provide you with the care you need, when you need it.  Your next appointment:   6 month(s)  The format for your next appointment:   Either In Person or Virtual  Provider:   Scott Weaver, PA-C, Vin Bhagat, PA-C or Janine Hammond, NP   

## 2020-01-08 ENCOUNTER — Ambulatory Visit: Payer: Medicare PPO | Attending: Internal Medicine

## 2020-01-08 DIAGNOSIS — Z23 Encounter for immunization: Secondary | ICD-10-CM | POA: Insufficient documentation

## 2020-01-08 NOTE — Progress Notes (Signed)
   Covid-19 Vaccination Clinic  Name:  HIEDI TOUCHTON    MRN: [id number] DOB: 1951-04-02  01/08/2020  Ms. Kennard was observed post Covid-19 immunization for 15 minutes without incidence. She was provided with Vaccine Information Sheet and instruction to access the V-Safe system.   Ms. Hun was instructed to call 911 with any severe reactions post vaccine: Marland Kitchen Difficulty breathing  . Swelling of your face and throat  . A fast heartbeat  . A bad rash all over your body  . Dizziness and weakness    Immunizations Administered    Name Date Dose VIS Date Route   Pfizer COVID-19 Vaccine 01/08/2020  8:34 AM 0.3 mL 10/24/2019 Intramuscular   Manufacturer: [employer], [employer]   Lot: [id number]   NDC: [address]-7

## 2020-01-20 ENCOUNTER — Other Ambulatory Visit: Payer: Self-pay | Admitting: Cardiovascular Disease

## 2020-01-20 ENCOUNTER — Ambulatory Visit (INDEPENDENT_AMBULATORY_CARE_PROVIDER_SITE_OTHER): Payer: Medicare PPO | Admitting: *Deleted

## 2020-01-20 DIAGNOSIS — I5022 Chronic systolic (congestive) heart failure: Secondary | ICD-10-CM | POA: Diagnosis not present

## 2020-01-20 LAB — CUP PACEART REMOTE DEVICE CHECK
Battery Remaining Longevity: 133 mo
Battery Voltage: 3.05 V
Brady Statistic RV Percent Paced: 0.01 %
Date Time Interrogation Session: 20210309033624
HighPow Impedance: 53 Ohm
HighPow Impedance: 60 Ohm
Implantable Lead Implant Date: 20110627
Implantable Lead Location: 753860
Implantable Lead Model: 6947
Implantable Pulse Generator Implant Date: 20200602
Lead Channel Impedance Value: 342 Ohm
Lead Channel Impedance Value: 437 Ohm
Lead Channel Pacing Threshold Amplitude: 0.75 V
Lead Channel Pacing Threshold Pulse Width: 0.4 ms
Lead Channel Sensing Intrinsic Amplitude: 7.625 mV
Lead Channel Sensing Intrinsic Amplitude: 7.625 mV
Lead Channel Setting Pacing Amplitude: 2.5 V
Lead Channel Setting Pacing Pulse Width: 0.4 ms
Lead Channel Setting Sensing Sensitivity: 0.3 mV

## 2020-01-21 NOTE — Progress Notes (Signed)
ICD Remote  

## 2020-01-28 ENCOUNTER — Ambulatory Visit: Payer: Medicare PPO | Attending: Internal Medicine

## 2020-01-28 DIAGNOSIS — Z23 Encounter for immunization: Secondary | ICD-10-CM

## 2020-01-28 NOTE — Progress Notes (Signed)
   Covid-19 Vaccination Clinic  Name:  [name]    MRN: [id number] DOB: December 25, 1950  01/28/2020  [name] was observed post Covid-19 immunization for 15 minutes without incident. She was provided with Vaccine Information Sheet and instruction to access the V-Safe system.   [name] was instructed to call 911 with any severe reactions post vaccine: Marland Kitchen Difficulty breathing  . Swelling of face and throat  . A fast heartbeat  . A bad rash all over body  . Dizziness and weakness   Immunizations Administered    Name Date Dose VIS Date Route   Pfizer COVID-19 Vaccine 01/28/2020 10:57 AM 0.3 mL 10/24/2019 Intramuscular   Manufacturer: [employer], [employer]   Lot: [id number]   NDC: [address]-1

## 2020-02-10 ENCOUNTER — Other Ambulatory Visit: Payer: Self-pay | Admitting: Cardiovascular Disease

## 2020-02-10 DIAGNOSIS — I2583 Coronary atherosclerosis due to lipid rich plaque: Secondary | ICD-10-CM

## 2020-02-10 DIAGNOSIS — I2510 Atherosclerotic heart disease of native coronary artery without angina pectoris: Secondary | ICD-10-CM

## 2020-03-24 ENCOUNTER — Other Ambulatory Visit: Payer: Self-pay | Admitting: Cardiovascular Disease

## 2020-04-20 ENCOUNTER — Ambulatory Visit (INDEPENDENT_AMBULATORY_CARE_PROVIDER_SITE_OTHER): Payer: Medicare PPO | Admitting: *Deleted

## 2020-04-20 DIAGNOSIS — I255 Ischemic cardiomyopathy: Secondary | ICD-10-CM | POA: Diagnosis not present

## 2020-04-20 LAB — CUP PACEART REMOTE DEVICE CHECK
Battery Remaining Longevity: 132 mo
Battery Voltage: 3.02 V
Brady Statistic RV Percent Paced: 0.01 %
Date Time Interrogation Session: 20210608031707
HighPow Impedance: 56 Ohm
HighPow Impedance: 72 Ohm
Implantable Lead Implant Date: 20110627
Implantable Lead Location: 753860
Implantable Lead Model: 6947
Implantable Pulse Generator Implant Date: 20200602
Lead Channel Impedance Value: 380 Ohm
Lead Channel Impedance Value: 456 Ohm
Lead Channel Pacing Threshold Amplitude: 0.75 V
Lead Channel Pacing Threshold Pulse Width: 0.4 ms
Lead Channel Sensing Intrinsic Amplitude: 7.875 mV
Lead Channel Sensing Intrinsic Amplitude: 7.875 mV
Lead Channel Setting Pacing Amplitude: 2.5 V
Lead Channel Setting Pacing Pulse Width: 0.4 ms
Lead Channel Setting Sensing Sensitivity: 0.3 mV

## 2020-04-21 NOTE — Progress Notes (Signed)
Remote ICD transmission.   

## 2020-04-28 ENCOUNTER — Other Ambulatory Visit: Payer: Self-pay | Admitting: *Deleted

## 2020-04-28 ENCOUNTER — Other Ambulatory Visit: Payer: Self-pay

## 2020-04-28 MED ORDER — APIXABAN 5 MG PO TABS: 5.0000 mg | ORAL_TABLET | Freq: Two times a day (BID) | ORAL | 2 refills | Status: DC

## 2020-04-28 MED ORDER — AMIODARONE HCL 200 MG PO TABS: ORAL_TABLET | ORAL | 2 refills | Status: DC

## 2020-04-28 NOTE — Telephone Encounter (Signed)
Eliquis 5mg  refill request received. Patient is 69 years old, weight-102.1kg, Crea-1.02 on 12/23/2019, Diagnosis-Afib, and last seen by Dr. 02/20/2020 on 12/23/2019. Dose is appropriate based on dosing criteria. Will send in refill to requested pharmacy.

## 2020-04-29 ENCOUNTER — Other Ambulatory Visit: Payer: Self-pay | Admitting: Internal Medicine

## 2020-04-29 ENCOUNTER — Other Ambulatory Visit: Payer: Self-pay | Admitting: Cardiovascular Disease

## 2020-04-30 NOTE — Telephone Encounter (Signed)
Prescription refill request for Eliquis received.  Last office visit: Nahser, 12/23/2019 Scr: 1.02, 12/23/2019 Age: 69 y.o. Weight: 102.1 kg   Prescription refill sent.

## 2020-05-19 ENCOUNTER — Other Ambulatory Visit: Payer: Self-pay

## 2020-05-19 ENCOUNTER — Encounter: Payer: Self-pay | Admitting: Ophthalmology

## 2020-05-24 NOTE — Discharge Instructions (Signed)

## 2020-05-26 ENCOUNTER — Ambulatory Visit: Payer: Medicare PPO | Admitting: Anesthesiology

## 2020-05-26 ENCOUNTER — Encounter: Payer: Self-pay | Admitting: Ophthalmology

## 2020-05-26 ENCOUNTER — Other Ambulatory Visit: Payer: Self-pay

## 2020-05-26 ENCOUNTER — Encounter
Admission: RE | Disposition: A | Discharge status: Home / Self Care | Payer: Self-pay | Source: Home / Self Care | Attending: Ophthalmology

## 2020-05-26 ENCOUNTER — Ambulatory Visit
Admission: RE | Admit: 2020-05-26 | Discharge: 2020-05-26 | Disposition: A | Discharge status: Home / Self Care | Payer: Medicare PPO | Attending: Ophthalmology | Admitting: Ophthalmology

## 2020-05-26 DIAGNOSIS — Z950 Presence of cardiac pacemaker: Secondary | ICD-10-CM | POA: Insufficient documentation

## 2020-05-26 DIAGNOSIS — I110 Hypertensive heart disease with heart failure: Secondary | ICD-10-CM | POA: Insufficient documentation

## 2020-05-26 DIAGNOSIS — E7800 Pure hypercholesterolemia, unspecified: Secondary | ICD-10-CM | POA: Diagnosis not present

## 2020-05-26 DIAGNOSIS — I509 Heart failure, unspecified: Secondary | ICD-10-CM | POA: Diagnosis not present

## 2020-05-26 DIAGNOSIS — Z79899 Other long term (current) drug therapy: Secondary | ICD-10-CM | POA: Insufficient documentation

## 2020-05-26 DIAGNOSIS — I252 Old myocardial infarction: Secondary | ICD-10-CM | POA: Insufficient documentation

## 2020-05-26 DIAGNOSIS — E1136 Type 2 diabetes mellitus with diabetic cataract: Secondary | ICD-10-CM | POA: Insufficient documentation

## 2020-05-26 DIAGNOSIS — H2511 Age-related nuclear cataract, right eye: Secondary | ICD-10-CM | POA: Insufficient documentation

## 2020-05-26 DIAGNOSIS — I2510 Atherosclerotic heart disease of native coronary artery without angina pectoris: Secondary | ICD-10-CM | POA: Diagnosis not present

## 2020-05-26 DIAGNOSIS — Z7901 Long term (current) use of anticoagulants: Secondary | ICD-10-CM | POA: Insufficient documentation

## 2020-05-26 DIAGNOSIS — I4891 Unspecified atrial fibrillation: Secondary | ICD-10-CM | POA: Insufficient documentation

## 2020-05-26 HISTORY — PX: CATARACT EXTRACTION W/PHACO: SHX586

## 2020-05-26 SURGERY — PHACOEMULSIFICATION, CATARACT, WITH IOL INSERTION
Anesthesia: Monitor Anesthesia Care | Site: Eye | Laterality: Right

## 2020-05-26 MED ORDER — ACETAMINOPHEN 325 MG PO TABS: 325.0000 mg | ORAL_TABLET | ORAL | Status: DC | PRN

## 2020-05-26 MED ORDER — MOXIFLOXACIN HCL 0.5 % OP SOLN
1.0000 [drp] | OPHTHALMIC | Status: DC | PRN
  Administered 2020-05-26 (×3): 1 [drp] via OPHTHALMIC

## 2020-05-26 MED ORDER — FENTANYL CITRATE (PF) 100 MCG/2ML IJ SOLN
INTRAMUSCULAR | Status: DC | PRN
  Administered 2020-05-26: 50 ug via INTRAVENOUS

## 2020-05-26 MED ORDER — LACTATED RINGERS IV SOLN: INTRAVENOUS | Status: DC

## 2020-05-26 MED ORDER — NA HYALUR & NA CHOND-NA HYALUR 0.4-0.35 ML IO KIT
PACK | INTRAOCULAR | Status: DC | PRN
  Administered 2020-05-26: 1 mL via INTRAOCULAR

## 2020-05-26 MED ORDER — ACETAMINOPHEN 160 MG/5ML PO SOLN: 325.0000 mg | ORAL | Status: DC | PRN

## 2020-05-26 MED ORDER — LIDOCAINE HCL (PF) 2 % IJ SOLN
INTRAOCULAR | Status: DC | PRN
  Administered 2020-05-26: 1 mL

## 2020-05-26 MED ORDER — CEFUROXIME OPHTHALMIC INJECTION 1 MG/0.1 ML
INJECTION | OPHTHALMIC | Status: DC | PRN
  Administered 2020-05-26: 0.1 mL via INTRACAMERAL

## 2020-05-26 MED ORDER — TETRACAINE HCL 0.5 % OP SOLN
1.0000 [drp] | OPHTHALMIC | Status: DC | PRN
  Administered 2020-05-26 (×3): 1 [drp] via OPHTHALMIC

## 2020-05-26 MED ORDER — MIDAZOLAM HCL 2 MG/2ML IJ SOLN
INTRAMUSCULAR | Status: DC | PRN
  Administered 2020-05-26: 1 mg via INTRAVENOUS

## 2020-05-26 MED ORDER — BRIMONIDINE TARTRATE-TIMOLOL 0.2-0.5 % OP SOLN
OPHTHALMIC | Status: DC | PRN
  Administered 2020-05-26: 1 [drp] via OPHTHALMIC

## 2020-05-26 MED ORDER — EPINEPHRINE PF 1 MG/ML IJ SOLN
INTRAOCULAR | Status: DC | PRN
  Administered 2020-05-26: 77 mL via OPHTHALMIC

## 2020-05-26 MED ORDER — ARMC OPHTHALMIC DILATING DROPS
1.0000 "application " | OPHTHALMIC | Status: DC | PRN
  Administered 2020-05-26 (×3): 1 via OPHTHALMIC

## 2020-05-26 SURGICAL SUPPLY — 30 items
CANNULA ANT/CHMB 27G (MISCELLANEOUS) ×1 IMPLANT
CANNULA ANT/CHMB 27GA (MISCELLANEOUS) ×3 IMPLANT
GLOVE SURG LX 7.5 STRW (GLOVE) ×4
GLOVE SURG LX STRL 7.5 STRW (GLOVE) ×1 IMPLANT
GLOVE SURG TRIUMPH 8.0 PF LTX (GLOVE) ×3 IMPLANT
GOWN STRL REUS W/ TWL LRG LVL3 (GOWN DISPOSABLE) ×2 IMPLANT
GOWN STRL REUS W/TWL LRG LVL3 (GOWN DISPOSABLE) ×4
LENS IOL DIOP 21.5 (Intraocular Lens) ×3 IMPLANT
LENS IOL TECNIS MONO 21.5 (Intraocular Lens) IMPLANT
MARKER SKIN DUAL TIP RULER LAB (MISCELLANEOUS) ×3 IMPLANT
NDL CAPSULORHEX 25GA (NEEDLE) ×1 IMPLANT
NDL FILTER BLUNT 18X1 1/2 (NEEDLE) ×2 IMPLANT
NDL RETROBULBAR .5 NSTRL (NEEDLE) IMPLANT
NEEDLE CAPSULORHEX 25GA (NEEDLE) ×3 IMPLANT
NEEDLE FILTER BLUNT 18X 1/2SAF (NEEDLE) ×4
NEEDLE FILTER BLUNT 18X1 1/2 (NEEDLE) ×2 IMPLANT
PACK CATARACT BRASINGTON (MISCELLANEOUS) ×3 IMPLANT
PACK EYE AFTER SURG (MISCELLANEOUS) ×3 IMPLANT
PACK OPTHALMIC (MISCELLANEOUS) ×3 IMPLANT
RING MALYGIN 7.0 (MISCELLANEOUS) IMPLANT
SOLUTION OPHTHALMIC SALT (MISCELLANEOUS) ×3 IMPLANT
SUT ETHILON 10-0 CS-B-6CS-B-6 (SUTURE)
SUT VICRYL  9 0 (SUTURE)
SUT VICRYL 9 0 (SUTURE) IMPLANT
SUTURE EHLN 10-0 CS-B-6CS-B-6 (SUTURE) IMPLANT
SYR 3ML LL SCALE MARK (SYRINGE) ×6 IMPLANT
SYR TB 1ML LUER SLIP (SYRINGE) ×3 IMPLANT
WATER STERILE IRR 250ML POUR (IV SOLUTION) ×3 IMPLANT
WICK EYE OCUCEL (MISCELLANEOUS) ×2 IMPLANT
WIPE NON LINTING 3.25X3.25 (MISCELLANEOUS) ×3 IMPLANT

## 2020-05-26 NOTE — Transfer of Care (Signed)
Immediate Anesthesia Transfer of Care Note  Patient: [name]  Procedure(s) Performed: CATARACT EXTRACTION PHACO AND INTRAOCULAR LENS PLACEMENT (IOC) RIGHT (Right Eye)  Patient Location: PACU  Anesthesia Type: MAC  Level of Consciousness: awake, alert  and patient cooperative  Airway and Oxygen Therapy: Patient Spontanous Breathing and Patient connected to supplemental oxygen  Post-op Assessment: Post-op Vital signs reviewed, Patient's Cardiovascular Status Stable, Respiratory Function Stable, Patent Airway and No signs of Nausea or vomiting  Post-op Vital Signs: Reviewed and stable  Complications: No complications documented.

## 2020-05-26 NOTE — Anesthesia Preprocedure Evaluation (Signed)
Anesthesia Evaluation  Patient identified by MRN, date of birth, ID band Patient awake    Reviewed: Allergy & Precautions, H&P , NPO status , Patient's Chart, lab work & pertinent test results, reviewed documented beta blocker date and time   Airway Mallampati: II  TM Distance: >3 FB Neck ROM: full    Dental no notable dental hx.    Pulmonary former smoker,    Pulmonary exam normal breath sounds clear to auscultation       Cardiovascular Exercise Tolerance: Good hypertension, + CAD, + Past MI and +CHF  Normal cardiovascular exam+ dysrhythmias Atrial Fibrillation + Cardiac Defibrillator  Rhythm:regular Rate:Normal  EF 30-35% with apical aneurysm Transient A fib/flutter, NSR in preop today   Neuro/Psych negative neurological ROS  negative psych ROS   GI/Hepatic negative GI ROS, Neg liver ROS,   Endo/Other  negative endocrine ROS  Renal/GU negative Renal ROS  negative genitourinary   Musculoskeletal   Abdominal   Peds  Hematology negative hematology ROS (+)   Anesthesia Other Findings   Reproductive/Obstetrics negative OB ROS                             Anesthesia Physical Anesthesia Plan  ASA: III  Anesthesia Plan: MAC   Post-op Pain Management:    Induction:   PONV Risk Score and Plan:   Airway Management Planned:   Additional Equipment:   Intra-op Plan:   Post-operative Plan:   Informed Consent: I have reviewed the patients History and Physical, chart, labs and discussed the procedure including the risks, benefits and alternatives for the proposed anesthesia with the patient or authorized representative who has indicated his/her understanding and acceptance.     Dental Advisory Given  Plan Discussed with: CRNA  Anesthesia Plan Comments:         Anesthesia Quick Evaluation

## 2020-05-26 NOTE — Anesthesia Procedure Notes (Signed)
Procedure Name: MAC Date/Time: 05/26/2020 7:43 AM Performed by: Cameron Ali, CRNA Pre-anesthesia Checklist: Patient identified, Emergency Drugs available, Suction available, Timeout performed and Patient being monitored Patient Re-evaluated:Patient Re-evaluated prior to induction Oxygen Delivery Method: Nasal cannula Placement Confirmation: positive ETCO2

## 2020-05-26 NOTE — Anesthesia Postprocedure Evaluation (Signed)
Anesthesia Post Note  Patient: [name]  Procedure(s) Performed: CATARACT EXTRACTION PHACO AND INTRAOCULAR LENS PLACEMENT (IOC) RIGHT (Right Eye)     Patient location during evaluation: PACU Anesthesia Type: MAC Level of consciousness: awake and alert Pain management: pain level controlled Vital Signs Assessment: post-procedure vital signs reviewed and stable Respiratory status: spontaneous breathing, nonlabored ventilation, respiratory function stable and patient connected to nasal cannula oxygen Cardiovascular status: stable and blood pressure returned to baseline Postop Assessment: no apparent nausea or vomiting Anesthetic complications: no   No complications documented.  Trecia Rogers

## 2020-05-26 NOTE — H&P (Signed)

## 2020-05-26 NOTE — Op Note (Signed)
LOCATION:  [facility]   PREOPERATIVE DIAGNOSIS:    Nuclear sclerotic cataract right eye. H25.11   POSTOPERATIVE DIAGNOSIS:  Nuclear sclerotic cataract right eye.     PROCEDURE:  Phacoemusification with posterior chamber intraocular lens placement of the right eye   ULTRASOUND TIME: Procedure(s) with comments: CATARACT EXTRACTION PHACO AND INTRAOCULAR LENS PLACEMENT (IOC) RIGHT (Right) - 13.77 1:26.4 15.9%  LENS:   Implant Name Type Inv. Item Serial No. Manufacturer Lot No. LRB No. Used Action  LENS IOL DIOP 21.5 - [id number] Intraocular Lens LENS IOL DIOP 21.5 [id number] [employer]  Right 1 Implanted         SURGEON:  Deirdre Evener, MD   ANESTHESIA:  Topical with tetracaine drops and 2% Xylocaine jelly, augmented with 1% preservative-free intracameral lidocaine.    COMPLICATIONS:  None.   DESCRIPTION OF PROCEDURE:  The patient was identified in the holding room and transported to the operating room and placed in the supine position under the operating microscope.  The right eye was identified as the operative eye and it was prepped and draped in the usual sterile ophthalmic fashion.   A 1 millimeter clear-corneal paracentesis was made at the 12:00 position.  0.5 ml of preservative-free 1% lidocaine was injected into the anterior chamber. The anterior chamber was filled with Viscoat viscoelastic.  A 2.4 millimeter keratome was used to make a near-clear corneal incision at the 9:00 position.  A curvilinear capsulorrhexis was made with a cystotome and capsulorrhexis forceps.  Balanced salt solution was used to hydrodissect and hydrodelineate the nucleus.   Phacoemulsification was then used in stop and chop fashion to remove the lens nucleus and epinucleus.  The remaining cortex was then removed using the irrigation and aspiration handpiece. Provisc was then placed into the capsular bag to distend it for lens placement.  A lens was then injected into the  capsular bag.  The remaining viscoelastic was aspirated.   Wounds were hydrated with balanced salt solution.  The anterior chamber was inflated to a physiologic pressure with balanced salt solution.  No wound leaks were noted. Cefuroxime 0.1 ml of a 10mg /ml solution was injected into the anterior chamber for a dose of 1 mg of intracameral antibiotic at the completion of the case.   Timolol and Brimonidine drops were applied to the eye.  The patient was taken to the recovery room in stable condition without complications of anesthesia or surgery.   Dalan Cowger 05/26/2020, 8:06 AM

## 2020-05-27 ENCOUNTER — Encounter: Payer: Self-pay | Admitting: Ophthalmology

## 2020-06-15 ENCOUNTER — Other Ambulatory Visit: Payer: Self-pay | Admitting: Cardiovascular Disease

## 2020-06-16 ENCOUNTER — Encounter: Payer: Self-pay | Admitting: Ophthalmology

## 2020-06-16 ENCOUNTER — Other Ambulatory Visit: Payer: Self-pay

## 2020-06-21 ENCOUNTER — Other Ambulatory Visit: Payer: Self-pay

## 2020-06-21 ENCOUNTER — Other Ambulatory Visit
Admission: RE | Admit: 2020-06-21 | Discharge: 2020-06-21 | Disposition: A | Discharge status: Home / Self Care | Payer: Medicare PPO | Source: Ambulatory Visit | Attending: Ophthalmology | Admitting: Ophthalmology

## 2020-06-21 DIAGNOSIS — Z01812 Encounter for preprocedural laboratory examination: Secondary | ICD-10-CM | POA: Insufficient documentation

## 2020-06-21 DIAGNOSIS — Z20822 Contact with and (suspected) exposure to covid-19: Secondary | ICD-10-CM | POA: Insufficient documentation

## 2020-06-21 LAB — SARS CORONAVIRUS 2 (TAT 6-24 HRS): SARS Coronavirus 2: NEGATIVE

## 2020-06-24 NOTE — Progress Notes (Signed)
Cardiology Office Note:    Date:  06/25/2020   ID:  [name], DOB 08-19-1951, MRN [id number]  PCP:  Gracelyn Nurse, MD  Cardiologist:  Kristeen Miss, MD   Electrophysiologist:  [name], MD   Referring MD: Gracelyn Nurse, MD   Chief Complaint:  Follow-up (CAD, CHF)    Patient Profile:    [name] is a 69 y.o. female with:   Coronary artery disease   S/p anterior STEMI >> PCI: BMS to LAD in 11/2008  Chronic combined systolic and diastolic CHF  Ischemic CM  EF 57-01 >> improved to 45-50  Echocardiogram 10/18: EF 45-50, Gr 2 DD, PASP 44, mild to mod MR  S/p AICD  Paroxysmal atrial fibrillation   Amiodarone Rx  Apixaban Rx   Hypertension   Hyperlipidemia   Pulmonary hypertension   Prior CV studies: Echocardiogram 09/05/17 EF 45-50, ant-sept AK, Gr 2 DD, mild to mod MR, mild RAE, PASP 44  Cardiac catheterization 12/12/08 LM normal LAD prox 100 LCx irregs RCA irregs EF 30 PCI:  BMS to LAD   History of Present Illness:    [name] was last seen by Dr. Elease Hashimoto in 12/2019.  She returns for follow up.  She is here alone.  She has been doing well without chest discomfort, significant shortness of breath, orthopnea, lower extremity swelling or syncope.  She recently had cataract surgery on 1 eye and has the other eye scheduled soon.  Past Medical History:  Diagnosis Date  . AICD (automatic cardioverter/defibrillator) present 05/09/2010   Medtronic Virtuoso  . CHF (congestive heart failure) (HCC)    EF 30-35% BY ECHO. SHE HAS AN APICAL ANEURYSM  . Coronary artery disease 12/12/2008   STATUS POST ANTERIOR WALL MYOCARDIAL INFARCTION. STATUS POST PTCA AND STENTING OF HER LAD  . Dyslipidemia   . Hypertension   . Myocardial infarction (HCC) 12/12/2008  . Presence of permanent cardiac pacemaker   . Transient atrial fibrillation or flutter     Current Medications: Current Meds  Medication Sig  . amiodarone (PACERONE) 200 MG tablet TAKE 1  TABLET BY MOUTH TWICE A WEEK  . atorvastatin (LIPITOR) 80 MG tablet Take 1 tablet by mouth once daily  . Calcium Carbonate-Vitamin D (CALCIUM 600+D PO) Take by mouth daily.  . carvedilol (COREG) 25 MG tablet TAKE 1 TABLET BY MOUTH TWICE DAILY WITH MEALS  . ELIQUIS 5 MG TABS tablet Take 1 tablet by mouth twice daily  . furosemide (LASIX) 40 MG tablet Take 1 tablet by mouth once daily  . lisinopril (ZESTRIL) 20 MG tablet Take 1 tablet by mouth once daily  . Multiple Vitamin (MULTIVITAMIN WITH MINERALS) TABS tablet Take 1 tablet by mouth daily.  . potassium chloride (KLOR-CON) 10 MEQ tablet Take 1 tablet by mouth once daily  . spironolactone (ALDACTONE) 25 MG tablet Take 1 tablet by mouth once daily     Allergies:   Patient has no known allergies.   Social History   Tobacco Use  . Smoking status: Former Smoker    Quit date: 12/14/2008    Years since quitting: 11.5  . Smokeless tobacco: Never Used  Vaping Use  . Vaping Use: Never used  Substance Use Topics  . Alcohol use: No  . Drug use: No     Family Hx: The patient's family history includes Heart disease in her brother and sister; Hypertension in her mother. There is no history of Breast cancer.  Review of Systems  Gastrointestinal: Negative for  hematochezia and melena.  Genitourinary: Negative for hematuria.     EKGs/Labs/Other Test Reviewed:    EKG:  EKG is   ordered today.  The ekg ordered today demonstrates normal sinus rhythm, heart rate 81, normal axis, no ST-T wave changes, septal Q waves, QTC 473  Recent Labs: 12/23/2019: ALT 19; BUN 20; Creatinine, Ser 1.02; Potassium 4.3; Sodium 139   Recent Lipid Panel Lab Results  Component Value Date/Time   CHOL 137 12/23/2019 09:53 AM   TRIG 109 12/23/2019 09:53 AM   HDL 50 12/23/2019 09:53 AM   CHOLHDL 2.7 12/23/2019 09:53 AM   CHOLHDL 2.4 05/10/2016 07:43 AM   LDLCALC 67 12/23/2019 09:53 AM    Physical Exam:    VS:  BP 126/80   Pulse 81   Ht 5\' 11"  (1.803 m)   Wt  220 lb (99.8 kg)   SpO2 97%   BMI 30.68 kg/m     Wt Readings from Last 3 Encounters:  06/25/20 220 lb (99.8 kg)  05/26/20 219 lb (99.3 kg)  12/23/19 225 lb (102.1 kg)     Constitutional:      Appearance: Healthy appearance. Not in distress.  Neck:     Thyroid: No thyromegaly.     Vascular: JVD normal.  Pulmonary:     Effort: Pulmonary effort is normal.     Breath sounds: No wheezing. No rales.  Cardiovascular:     Normal rate. Regular rhythm. Normal S1. Normal S2.     Murmurs: There is no murmur.  Edema:    Peripheral edema absent.  Abdominal:     Palpations: Abdomen is soft. There is no hepatomegaly.  Skin:    General: Skin is warm and dry.  Neurological:     Mental Status: Alert and oriented to person, place and time.     Cranial Nerves: Cranial nerves are intact.       ASSESSMENT & PLAN:    1. Coronary artery disease involving native coronary artery of native heart without angina pectoris History of anterior STEMI in 2010 treated with a bare-metal stent to the LAD.  She is doing well with symptoms.  She is no longer on aspirin as she is on Apixaban.  Continue atorvastatin, carvedilol.  2. Chronic combined systolic and diastolic heart failure (HCC) Ischemic cardiomyopathy.  EF 45-50.  NYHA II.  Volume status stable.  Continue beta-blocker, ACE inhibitor, MRA.  3. PAF (paroxysmal atrial fibrillation) (HCC) Maintaining sinus rhythm.  She is tolerating anticoagulation.  Continue current dose of Apixaban, amiodarone.  4. Automatic implantable cardioverter-defibrillator in situ Continue follow-up with the EP as planned.  5. Essential (primary) hypertension The patient's blood pressure is controlled on her current regimen.  Continue current therapy.   6. Mixed hyperlipidemia LDL optimal on most recent lab work.  Continue current Rx.    7. Long-term use of high-risk medication She remains on amiodarone therapy.  ALT in February was normal.  Obtain TSH today. She  remains on Apixaban.  Creatinine in February was normal.  Obtain follow-up CBC today.    Dispo:  Return in about 6 months (around 12/26/2020) for Routine Follow Up, w/ Dr. 12/28/2020, in person.   Medication Adjustments/Labs and Tests Ordered: Current medicines are reviewed at length with the patient today.  Concerns regarding medicines are outlined above.  Tests Ordered: Orders Placed This Encounter  Procedures  . CBC  . TSH  . EKG 12-Lead   Medication Changes: No orders of the defined types were placed in this  encounter.   Signed, Tereso Newcomer, PA-C  06/25/2020 9:55 AM    [facility] [address] [place], [place], [place]  [address] Phone: [phone]; Fax: [phone]

## 2020-06-25 ENCOUNTER — Other Ambulatory Visit: Payer: Self-pay

## 2020-06-25 ENCOUNTER — Ambulatory Visit (INDEPENDENT_AMBULATORY_CARE_PROVIDER_SITE_OTHER): Payer: Medicare PPO | Admitting: Physician Assistant

## 2020-06-25 ENCOUNTER — Encounter: Payer: Self-pay | Admitting: Physician Assistant

## 2020-06-25 VITALS — BP 126/80 | HR 81 | Ht 71.0 in | Wt 220.0 lb

## 2020-06-25 DIAGNOSIS — I5042 Chronic combined systolic (congestive) and diastolic (congestive) heart failure: Secondary | ICD-10-CM

## 2020-06-25 DIAGNOSIS — I2510 Atherosclerotic heart disease of native coronary artery without angina pectoris: Secondary | ICD-10-CM

## 2020-06-25 DIAGNOSIS — E782 Mixed hyperlipidemia: Secondary | ICD-10-CM

## 2020-06-25 DIAGNOSIS — I480 Paroxysmal atrial fibrillation: Secondary | ICD-10-CM

## 2020-06-25 DIAGNOSIS — Z79899 Other long term (current) drug therapy: Secondary | ICD-10-CM

## 2020-06-25 DIAGNOSIS — I10 Essential (primary) hypertension: Secondary | ICD-10-CM

## 2020-06-25 DIAGNOSIS — Z95810 Presence of automatic (implantable) cardiac defibrillator: Secondary | ICD-10-CM | POA: Diagnosis not present

## 2020-06-25 LAB — TSH: TSH: 3.21 u[IU]/mL (ref 0.450–4.500)

## 2020-06-25 LAB — CBC
Hematocrit: 39 % (ref 34.0–46.6)
Hemoglobin: 12.7 g/dL (ref 11.1–15.9)
MCH: 27.9 pg (ref 26.6–33.0)
MCHC: 32.6 g/dL (ref 31.5–35.7)
MCV: 86 fL (ref 79–97)
Platelets: 218 10*3/uL (ref 150–450)
RBC: 4.55 x10E6/uL (ref 3.77–5.28)
RDW: 12.9 % (ref 11.7–15.4)
WBC: 5.8 10*3/uL (ref 3.4–10.8)

## 2020-06-25 NOTE — Patient Instructions (Signed)
Medication Instructions:  Your physician recommends that you continue on your current medications as directed. Please refer to the Current Medication list given to you today.  *If you need a refill on your cardiac medications before your next appointment, please call your pharmacy*  Lab Work: You will have labs drawn today: TSH/CBC  Testing/Procedures: None ordered today  Follow-Up: At [facility], you and your health needs are our priority.  As part of our continuing mission to provide you with exceptional heart care, we have created designated Provider Care Teams.  These Care Teams include your primary Cardiologist (physician) and Advanced Practice Providers (APPs -  Physician Assistants and Nurse Practitioners) who all work together to provide you with the care you need, when you need it.  We recommend signing up for the patient portal called "MyChart".  Sign up information is provided on this After Visit Summary.  MyChart is used to connect with patients for Virtual Visits (Telemedicine).  Patients are able to view lab/test results, encounter notes, upcoming appointments, etc.  Non-urgent messages can be sent to your provider as well.   To learn more about what you can do with MyChart, go to [email or web address].    Your next appointment:   6 month(s)  The format for your next appointment:   In Person  Provider:   Kristeen Miss, MD

## 2020-07-20 ENCOUNTER — Ambulatory Visit (INDEPENDENT_AMBULATORY_CARE_PROVIDER_SITE_OTHER): Payer: Medicare PPO | Admitting: *Deleted

## 2020-07-20 DIAGNOSIS — I255 Ischemic cardiomyopathy: Secondary | ICD-10-CM

## 2020-07-20 LAB — CUP PACEART REMOTE DEVICE CHECK
Battery Remaining Longevity: 130 mo
Battery Voltage: 3.01 V
Brady Statistic RV Percent Paced: 0.01 %
Date Time Interrogation Session: 20210907044228
HighPow Impedance: 51 Ohm
HighPow Impedance: 63 Ohm
Implantable Lead Implant Date: 20110627
Implantable Lead Location: 753860
Implantable Lead Model: 6947
Implantable Pulse Generator Implant Date: 20200602
Lead Channel Impedance Value: 380 Ohm
Lead Channel Impedance Value: 494 Ohm
Lead Channel Pacing Threshold Amplitude: 0.75 V
Lead Channel Pacing Threshold Pulse Width: 0.4 ms
Lead Channel Sensing Intrinsic Amplitude: 8.75 mV
Lead Channel Sensing Intrinsic Amplitude: 8.75 mV
Lead Channel Setting Pacing Amplitude: 2.5 V
Lead Channel Setting Pacing Pulse Width: 0.4 ms
Lead Channel Setting Sensing Sensitivity: 0.3 mV

## 2020-07-21 NOTE — Progress Notes (Signed)
Remote ICD transmission.   

## 2020-07-22 ENCOUNTER — Encounter: Payer: Self-pay | Admitting: Internal Medicine

## 2020-07-22 ENCOUNTER — Ambulatory Visit (INDEPENDENT_AMBULATORY_CARE_PROVIDER_SITE_OTHER): Payer: Medicare PPO | Admitting: Internal Medicine

## 2020-07-22 ENCOUNTER — Other Ambulatory Visit: Payer: Self-pay

## 2020-07-22 VITALS — BP 140/76 | HR 77 | Ht 71.0 in | Wt 220.0 lb

## 2020-07-22 DIAGNOSIS — I5022 Chronic systolic (congestive) heart failure: Secondary | ICD-10-CM | POA: Diagnosis not present

## 2020-07-22 DIAGNOSIS — Z95810 Presence of automatic (implantable) cardiac defibrillator: Secondary | ICD-10-CM

## 2020-07-22 DIAGNOSIS — I480 Paroxysmal atrial fibrillation: Secondary | ICD-10-CM | POA: Diagnosis not present

## 2020-07-22 DIAGNOSIS — I428 Other cardiomyopathies: Secondary | ICD-10-CM

## 2020-07-22 LAB — CUP PACEART INCLINIC DEVICE CHECK
Battery Remaining Longevity: 130 mo
Battery Voltage: 3.01 V
Brady Statistic RV Percent Paced: 0.01 %
Date Time Interrogation Session: 20210909100222
HighPow Impedance: 55 Ohm
HighPow Impedance: 75 Ohm
Implantable Lead Implant Date: 20110627
Implantable Lead Location: 753860
Implantable Lead Model: 6947
Implantable Pulse Generator Implant Date: 20200602
Lead Channel Impedance Value: 399 Ohm
Lead Channel Impedance Value: 494 Ohm
Lead Channel Pacing Threshold Amplitude: 0.75 V
Lead Channel Pacing Threshold Pulse Width: 0.4 ms
Lead Channel Sensing Intrinsic Amplitude: 10.5 mV
Lead Channel Sensing Intrinsic Amplitude: 9.75 mV
Lead Channel Setting Pacing Amplitude: 2.5 V
Lead Channel Setting Pacing Pulse Width: 0.4 ms
Lead Channel Setting Sensing Sensitivity: 0.3 mV

## 2020-07-22 NOTE — Progress Notes (Signed)
HPI [name] returns today for followup. She is a pleasant 69 yo woman with CAD, s/p anterior MI, s/p PCI. She is s/p ICD insertion. She has PAF with a remote ICD shock for RVR. She has a known apical aneurysm. She is walking a couple of times a week. She denies chest pain or sob or edema or syncope. No Known Allergies   Current Outpatient Medications  Medication Sig Dispense Refill  . amiodarone (PACERONE) 200 MG tablet TAKE 1 TABLET BY MOUTH TWICE A WEEK 24 tablet 2  . atorvastatin (LIPITOR) 80 MG tablet Take 1 tablet by mouth once daily 90 tablet 3  . Calcium Carbonate-Vitamin D (CALCIUM 600+D PO) Take by mouth daily.    . carvedilol (COREG) 25 MG tablet TAKE 1 TABLET BY MOUTH TWICE DAILY WITH MEALS 180 tablet 2  . ELIQUIS 5 MG TABS tablet Take 1 tablet by mouth twice daily 180 tablet 1  . furosemide (LASIX) 40 MG tablet Take 1 tablet by mouth once daily 90 tablet 3  . lisinopril (ZESTRIL) 20 MG tablet Take 1 tablet by mouth once daily 90 tablet 3  . Multiple Vitamin (MULTIVITAMIN WITH MINERALS) TABS tablet Take 1 tablet by mouth daily.    . potassium chloride (KLOR-CON) 10 MEQ tablet Take 1 tablet by mouth once daily 90 tablet 2  . spironolactone (ALDACTONE) 25 MG tablet Take 1 tablet by mouth once daily 90 tablet 1   No current facility-administered medications for this visit.     Past Medical History:  Diagnosis Date  . AICD (automatic cardioverter/defibrillator) present 05/09/2010   Medtronic Virtuoso  . CHF (congestive heart failure) (HCC)    EF 30-35% BY ECHO. SHE HAS AN APICAL ANEURYSM  . Coronary artery disease 12/12/2008   STATUS POST ANTERIOR WALL MYOCARDIAL INFARCTION. STATUS POST PTCA AND STENTING OF HER LAD  . Dyslipidemia   . Hypertension   . Myocardial infarction (HCC) 12/12/2008  . Presence of permanent cardiac pacemaker   . Transient atrial fibrillation or flutter     ROS:   All systems reviewed and negative except as noted in the HPI.   Past  Surgical History:  Procedure Laterality Date  . CARDIAC CATHETERIZATION  12/12/2008   stent  . CARDIAC DEFIBRILLATOR PLACEMENT  05/09/2010   Medtronic Virtuoso  . CATARACT EXTRACTION W/PHACO Right 05/26/2020   Procedure: CATARACT EXTRACTION PHACO AND INTRAOCULAR LENS PLACEMENT (IOC) RIGHT;  Surgeon: Lockie Mola, MD;  Location: [facility];  Service: Ophthalmology;  Laterality: Right;  13.77 1:26.4 15.9%  . COLONOSCOPY    . ICD GENERATOR CHANGEOUT N/A 04/15/2019   Procedure: ICD GENERATOR CHANGEOUT;  Surgeon: Marinus Maw, MD;  Location: [facility] INVASIVE CV LAB;  Service: Cardiovascular;  Laterality: N/A;     Family History  Problem Relation Age of Onset  . Hypertension Mother   . Heart disease Sister   . Heart disease Brother   . Breast cancer Neg Hx      Social History   Socioeconomic History  . Marital status: Widowed    Spouse name: Not on file  . Number of children: Not on file  . Years of education: Not on file  . Highest education level: Not on file  Occupational History  . Not on file  Tobacco Use  . Smoking status: Former Smoker    Quit date: 12/14/2008    Years since quitting: 11.6  . Smokeless tobacco: Never Used  Vaping Use  . Vaping Use: Never used  Substance and Sexual Activity  . Alcohol use: No  . Drug use: No  . Sexual activity: Not on file  Other Topics Concern  . Not on file  Social History Narrative  . Not on file   Social Determinants of Health   Financial Resource Strain:   . Difficulty of Paying Living Expenses: Not on file  Food Insecurity:   . Worried About Programme researcher, broadcasting/film/video in the Last Year: Not on file  . Ran Out of Food in the Last Year: Not on file  Transportation Needs:   . Lack of Transportation (Medical): Not on file  . Lack of Transportation (Non-Medical): Not on file  Physical Activity:   . Days of Exercise per Week: Not on file  . Minutes of Exercise per Session: Not on file  Stress:   . Feeling of Stress :  Not on file  Social Connections:   . Frequency of Communication with Friends and Family: Not on file  . Frequency of Social Gatherings with Friends and Family: Not on file  . Attends Religious Services: Not on file  . Active Member of Clubs or Organizations: Not on file  . Attends Banker Meetings: Not on file  . Marital Status: Not on file  Intimate Partner Violence:   . Fear of Current or Ex-Partner: Not on file  . Emotionally Abused: Not on file  . Physically Abused: Not on file  . Sexually Abused: Not on file     BP 140/76   Pulse 77   Ht 5\' 11"  (1.803 m)   Wt 220 lb (99.8 kg)   SpO2 97%   BMI 30.68 kg/m   Physical Exam:  Well appearing overweight 69 yo woman, NAD HEENT: Unremarkable Neck:  6 cm JVD, no thyromegally Lymphatics:  No adenopathy Back:  No CVA tenderness Lungs:  Clear with no wheezes HEART:  Regular rate rhythm, no murmurs, no rubs, no clicks Abd:  soft, positive bowel sounds, no organomegally, no rebound, no guarding Ext:  2 plus pulses, no edema, no cyanosis, no clubbing Skin:  No rashes no nodules Neuro:  CN II through XII intact, motor grossly intact   DEVICE  Normal device function.  See PaceArt for details.   Assess/Plan: 1. ICM - she denies anginal symptoms and remains active. 2. Chronic systolic heart failure - she is class 2. She will continue her current meds. 3. Obesity - I encouraged her to lose weight. 4. ICD - her medtronic single chamber ICD is working normally. No change. We will recheck in several months.  73 Gerado Nabers,MD

## 2020-07-22 NOTE — Patient Instructions (Signed)

## 2020-08-11 ENCOUNTER — Encounter: Payer: Self-pay | Admitting: Ophthalmology

## 2020-08-11 ENCOUNTER — Other Ambulatory Visit: Payer: Self-pay

## 2020-08-16 ENCOUNTER — Other Ambulatory Visit
Admission: RE | Admit: 2020-08-16 | Discharge: 2020-08-16 | Disposition: A | Discharge status: Home / Self Care | Payer: Medicare PPO | Source: Ambulatory Visit | Attending: Ophthalmology | Admitting: Ophthalmology

## 2020-08-16 ENCOUNTER — Other Ambulatory Visit: Payer: Self-pay

## 2020-08-16 DIAGNOSIS — Z01812 Encounter for preprocedural laboratory examination: Secondary | ICD-10-CM | POA: Diagnosis present

## 2020-08-16 DIAGNOSIS — Z20822 Contact with and (suspected) exposure to covid-19: Secondary | ICD-10-CM | POA: Insufficient documentation

## 2020-08-16 LAB — SARS CORONAVIRUS 2 (TAT 6-24 HRS): SARS Coronavirus 2: NEGATIVE

## 2020-08-16 NOTE — Discharge Instructions (Signed)

## 2020-08-17 NOTE — Anesthesia Preprocedure Evaluation (Addendum)
Anesthesia Evaluation  Patient identified by MRN, date of birth, ID band Patient awake    Reviewed: Allergy & Precautions, H&P , NPO status , Patient's Chart, lab work & pertinent test results  History of Anesthesia Complications Negative for: history of anesthetic complications  Airway Mallampati: II  TM Distance: >3 FB Neck ROM: full    Dental no notable dental hx.    Pulmonary former smoker (quit 2010),    Pulmonary exam normal breath sounds clear to auscultation       Cardiovascular Exercise Tolerance: Good hypertension, + CAD, + Past MI, + Cardiac Stents (on Eliquis) and +CHF  Normal cardiovascular exam+ dysrhythmias Atrial Fibrillation + Cardiac Defibrillator  Rhythm:Regular Rate:Normal  EF 30-35% with apical aneurysm Transient A fib/flutter, NSR in preop today   Neuro/Psych negative neurological ROS  negative psych ROS   GI/Hepatic negative GI ROS, Neg liver ROS,   Endo/Other  negative endocrine ROS  Renal/GU negative Renal ROS  negative genitourinary   Musculoskeletal   Abdominal   Peds  Hematology negative hematology ROS (+)   Anesthesia Other Findings   Reproductive/Obstetrics negative OB ROS                            Anesthesia Physical  Anesthesia Plan  ASA: III  Anesthesia Plan: MAC   Post-op Pain Management:    Induction: Intravenous  PONV Risk Score and Plan: 2 and TIVA, Midazolam and Treatment may vary due to age or medical condition  Airway Management Planned: Nasal Cannula  Additional Equipment:   Intra-op Plan:   Post-operative Plan:   Informed Consent: I have reviewed the patients History and Physical, chart, labs and discussed the procedure including the risks, benefits and alternatives for the proposed anesthesia with the patient or authorized representative who has indicated his/her understanding and acceptance.       Plan Discussed with:  CRNA  Anesthesia Plan Comments:        Anesthesia Quick Evaluation

## 2020-08-18 ENCOUNTER — Other Ambulatory Visit: Payer: Self-pay

## 2020-08-18 ENCOUNTER — Ambulatory Visit
Admission: RE | Admit: 2020-08-18 | Discharge: 2020-08-18 | Disposition: A | Discharge status: Home / Self Care | Payer: Medicare PPO | Attending: Ophthalmology | Admitting: Ophthalmology

## 2020-08-18 ENCOUNTER — Encounter
Admission: RE | Disposition: A | Discharge status: Home / Self Care | Payer: Self-pay | Source: Home / Self Care | Attending: Ophthalmology

## 2020-08-18 ENCOUNTER — Encounter: Payer: Self-pay | Admitting: Anesthesiology

## 2020-08-18 ENCOUNTER — Encounter: Payer: Self-pay | Admitting: Ophthalmology

## 2020-08-18 DIAGNOSIS — Z87891 Personal history of nicotine dependence: Secondary | ICD-10-CM | POA: Diagnosis not present

## 2020-08-18 DIAGNOSIS — H2512 Age-related nuclear cataract, left eye: Secondary | ICD-10-CM | POA: Diagnosis present

## 2020-08-18 DIAGNOSIS — E7800 Pure hypercholesterolemia, unspecified: Secondary | ICD-10-CM | POA: Insufficient documentation

## 2020-08-18 DIAGNOSIS — Z79899 Other long term (current) drug therapy: Secondary | ICD-10-CM | POA: Diagnosis not present

## 2020-08-18 DIAGNOSIS — I252 Old myocardial infarction: Secondary | ICD-10-CM | POA: Insufficient documentation

## 2020-08-18 DIAGNOSIS — I10 Essential (primary) hypertension: Secondary | ICD-10-CM | POA: Diagnosis not present

## 2020-08-18 DIAGNOSIS — Z7901 Long term (current) use of anticoagulants: Secondary | ICD-10-CM | POA: Insufficient documentation

## 2020-08-18 DIAGNOSIS — Z955 Presence of coronary angioplasty implant and graft: Secondary | ICD-10-CM | POA: Insufficient documentation

## 2020-08-18 HISTORY — PX: CATARACT EXTRACTION W/PHACO: SHX586

## 2020-08-18 HISTORY — DX: Presence of cardiac pacemaker: Z95.0

## 2020-08-18 SURGERY — PHACOEMULSIFICATION, CATARACT, WITH IOL INSERTION
Anesthesia: Monitor Anesthesia Care | Site: Eye | Laterality: Left

## 2020-08-18 MED ORDER — EPINEPHRINE PF 1 MG/ML IJ SOLN: INTRAOCULAR | Status: DC | PRN

## 2020-08-18 MED ORDER — ARMC OPHTHALMIC DILATING DROPS
1.0000 "application " | OPHTHALMIC | Status: DC | PRN
  Administered 2020-08-18 (×3): 1 via OPHTHALMIC

## 2020-08-18 MED ORDER — NEOMYCIN-POLYMYXIN-DEXAMETH 3.5-10000-0.1 OP OINT
TOPICAL_OINTMENT | OPHTHALMIC | Status: DC | PRN
  Administered 2020-08-18: 1 via OPHTHALMIC

## 2020-08-18 MED ORDER — LACTATED RINGERS IV SOLN: INTRAVENOUS | Status: DC

## 2020-08-18 MED ORDER — ACETAMINOPHEN 160 MG/5ML PO SOLN: 325.0000 mg | ORAL | Status: DC | PRN

## 2020-08-18 MED ORDER — MOXIFLOXACIN HCL 0.5 % OP SOLN
1.0000 [drp] | OPHTHALMIC | Status: DC | PRN
  Administered 2020-08-18 (×3): 1 [drp] via OPHTHALMIC

## 2020-08-18 MED ORDER — LIDOCAINE HCL (PF) 2 % IJ SOLN
INTRAOCULAR | Status: DC | PRN
  Administered 2020-08-18: 1 mL

## 2020-08-18 MED ORDER — BRIMONIDINE TARTRATE-TIMOLOL 0.2-0.5 % OP SOLN
OPHTHALMIC | Status: DC | PRN
  Administered 2020-08-18: 1 [drp] via OPHTHALMIC

## 2020-08-18 MED ORDER — ACETAMINOPHEN 325 MG PO TABS: 650.0000 mg | ORAL_TABLET | Freq: Once | ORAL | Status: DC | PRN

## 2020-08-18 MED ORDER — CEFUROXIME OPHTHALMIC INJECTION 1 MG/0.1 ML
INJECTION | OPHTHALMIC | Status: DC | PRN
  Administered 2020-08-18: 0.1 mL via INTRACAMERAL

## 2020-08-18 MED ORDER — MIDAZOLAM HCL 2 MG/2ML IJ SOLN
INTRAMUSCULAR | Status: DC | PRN
  Administered 2020-08-18: 2 mg via INTRAVENOUS

## 2020-08-18 MED ORDER — TETRACAINE HCL 0.5 % OP SOLN
1.0000 [drp] | OPHTHALMIC | Status: DC | PRN
  Administered 2020-08-18 (×3): 1 [drp] via OPHTHALMIC

## 2020-08-18 MED ORDER — NA HYALUR & NA CHOND-NA HYALUR 0.4-0.35 ML IO KIT
PACK | INTRAOCULAR | Status: DC | PRN
  Administered 2020-08-18: 1 mL via INTRAOCULAR

## 2020-08-18 MED ORDER — ONDANSETRON HCL 4 MG/2ML IJ SOLN: 4.0000 mg | Freq: Once | INTRAMUSCULAR | Status: DC | PRN

## 2020-08-18 MED ORDER — FENTANYL CITRATE (PF) 100 MCG/2ML IJ SOLN
INTRAMUSCULAR | Status: DC | PRN
Start: 2020-08-18 — End: 2020-08-18
  Administered 2020-08-18 (×2): 50 ug via INTRAVENOUS

## 2020-08-18 SURGICAL SUPPLY — 29 items
CANNULA ANT/CHMB 27G (MISCELLANEOUS) ×1 IMPLANT
CANNULA ANT/CHMB 27GA (MISCELLANEOUS) ×3 IMPLANT
GLOVE SURG LX 7.5 STRW (GLOVE) ×4
GLOVE SURG LX STRL 7.5 STRW (GLOVE) ×1 IMPLANT
GLOVE SURG TRIUMPH 8.0 PF LTX (GLOVE) ×3 IMPLANT
GOWN STRL REUS W/ TWL LRG LVL3 (GOWN DISPOSABLE) ×2 IMPLANT
GOWN STRL REUS W/TWL LRG LVL3 (GOWN DISPOSABLE) ×6
LENS IOL DIOP 22.0 (Intraocular Lens) ×3 IMPLANT
LENS IOL TECNIS MONO 22.0 (Intraocular Lens) IMPLANT
MARKER SKIN DUAL TIP RULER LAB (MISCELLANEOUS) ×3 IMPLANT
NDL CAPSULORHEX 25GA (NEEDLE) ×1 IMPLANT
NDL FILTER BLUNT 18X1 1/2 (NEEDLE) ×2 IMPLANT
NDL RETROBULBAR .5 NSTRL (NEEDLE) IMPLANT
NEEDLE CAPSULORHEX 25GA (NEEDLE) ×3 IMPLANT
NEEDLE FILTER BLUNT 18X 1/2SAF (NEEDLE) ×4
NEEDLE FILTER BLUNT 18X1 1/2 (NEEDLE) ×2 IMPLANT
PACK CATARACT BRASINGTON (MISCELLANEOUS) ×3 IMPLANT
PACK EYE AFTER SURG (MISCELLANEOUS) ×3 IMPLANT
PACK OPTHALMIC (MISCELLANEOUS) ×3 IMPLANT
RING MALYGIN 7.0 (MISCELLANEOUS) IMPLANT
SOLUTION OPHTHALMIC SALT (MISCELLANEOUS) ×3 IMPLANT
SUT ETHILON 10-0 CS-B-6CS-B-6 (SUTURE)
SUT VICRYL  9 0 (SUTURE)
SUT VICRYL 9 0 (SUTURE) IMPLANT
SUTURE EHLN 10-0 CS-B-6CS-B-6 (SUTURE) IMPLANT
SYR 3ML LL SCALE MARK (SYRINGE) ×6 IMPLANT
SYR TB 1ML LUER SLIP (SYRINGE) ×3 IMPLANT
WATER STERILE IRR 250ML POUR (IV SOLUTION) ×3 IMPLANT
WIPE NON LINTING 3.25X3.25 (MISCELLANEOUS) ×3 IMPLANT

## 2020-08-18 NOTE — Anesthesia Postprocedure Evaluation (Signed)
Anesthesia Post Note  Patient: [name]  Procedure(s) Performed: CATARACT EXTRACTION PHACO AND INTRAOCULAR LENS PLACEMENT (IOC) LEFT (Left Eye)     Patient location during evaluation: PACU Anesthesia Type: MAC Level of consciousness: awake and alert, oriented and patient cooperative Pain management: pain level controlled Vital Signs Assessment: post-procedure vital signs reviewed and stable Respiratory status: spontaneous breathing, nonlabored ventilation and respiratory function stable Cardiovascular status: blood pressure returned to baseline and stable Postop Assessment: adequate PO intake Anesthetic complications: no   No complications documented.  Darrin Nipper

## 2020-08-18 NOTE — Anesthesia Procedure Notes (Signed)
Procedure Name: MAC Performed by: Ante Arredondo, CRNA Pre-anesthesia Checklist: Patient identified, Emergency Drugs available, Suction available, Timeout performed and Patient being monitored Patient Re-evaluated:Patient Re-evaluated prior to induction Oxygen Delivery Method: Nasal cannula Placement Confirmation: positive ETCO2       

## 2020-08-18 NOTE — Transfer of Care (Signed)
Immediate Anesthesia Transfer of Care Note  Patient: [name]  Procedure(s) Performed: CATARACT EXTRACTION PHACO AND INTRAOCULAR LENS PLACEMENT (IOC) LEFT (Left Eye)  Patient Location: PACU  Anesthesia Type: MAC  Level of Consciousness: awake, alert  and patient cooperative  Airway and Oxygen Therapy: Patient Spontanous Breathing and Patient connected to supplemental oxygen  Post-op Assessment: Post-op Vital signs reviewed, Patient's Cardiovascular Status Stable, Respiratory Function Stable, Patent Airway and No signs of Nausea or vomiting  Post-op Vital Signs: Reviewed and stable  Complications: No complications documented.

## 2020-08-18 NOTE — H&P (Signed)

## 2020-08-18 NOTE — Op Note (Signed)
OPERATIVE NOTE  HEND MCCARRELL [id number] 08/18/2020   PREOPERATIVE DIAGNOSIS:  Nuclear sclerotic cataract left eye. H25.12   POSTOPERATIVE DIAGNOSIS:    Nuclear sclerotic cataract left eye.     PROCEDURE:  Phacoemusification with posterior chamber intraocular lens placement of the left eye  Ultrasound time: Procedure(s) with comments: CATARACT EXTRACTION PHACO AND INTRAOCULAR LENS PLACEMENT (IOC) LEFT (Left) - 4.87 0:57.0 8.6%  LENS:   Implant Name Type Inv. Item Serial No. Manufacturer Lot No. LRB No. Used Action  LENS IOL DIOP 22.0 - [id number] Intraocular Lens LENS IOL DIOP 22.0 [id number] JOHNSON   Left 1 Implanted      SURGEON:  Deirdre Evener, MD   ANESTHESIA:  Topical with tetracaine drops and 2% Xylocaine jelly, augmented with 1% preservative-free intracameral lidocaine.    COMPLICATIONS:  None.   DESCRIPTION OF PROCEDURE:  The patient was identified in the holding room and transported to the operating room and placed in the supine position under the operating microscope.  The left eye was identified as the operative eye and it was prepped and draped in the usual sterile ophthalmic fashion.   A 1 millimeter clear-corneal paracentesis was made at the 1:30 position.  0.5 ml of preservative-free 1% lidocaine was injected into the anterior chamber.  The anterior chamber was filled with Viscoat viscoelastic.  A 2.4 millimeter keratome was used to make a near-clear corneal incision at the 10:30 position.  .  A curvilinear capsulorrhexis was made with a cystotome and capsulorrhexis forceps.  Balanced salt solution was used to hydrodissect and hydrodelineate the nucleus.   Phacoemulsification was then used in stop and chop fashion to remove the lens nucleus and epinucleus.  The remaining cortex was then removed using the irrigation and aspiration handpiece. Provisc was then placed into the capsular bag to distend it for lens placement.  A lens was then injected into the  capsular bag.  The remaining viscoelastic was aspirated.   Wounds were hydrated with balanced salt solution.  The anterior chamber was inflated to a physiologic pressure with balanced salt solution.  No wound leaks were noted. Cefuroxime 0.1 ml of a 10mg /ml solution was injected into the anterior chamber for a dose of 1 mg of intracameral antibiotic at the completion of the case.   Timolol and Brimonidine drops and Maxitrol ointment were applied to the eye.  The patient was taken to the recovery room in stable condition without complications of anesthesia or surgery.  Alieah Brinton 08/18/2020, 1:08 PM

## 2020-09-07 ENCOUNTER — Other Ambulatory Visit: Payer: Self-pay | Admitting: Internal Medicine

## 2020-09-15 ENCOUNTER — Other Ambulatory Visit: Payer: Self-pay | Admitting: Internal Medicine

## 2020-09-15 DIAGNOSIS — Z1231 Encounter for screening mammogram for malignant neoplasm of breast: Secondary | ICD-10-CM

## 2020-10-19 ENCOUNTER — Ambulatory Visit (INDEPENDENT_AMBULATORY_CARE_PROVIDER_SITE_OTHER): Payer: Medicare PPO

## 2020-10-19 DIAGNOSIS — I428 Other cardiomyopathies: Secondary | ICD-10-CM

## 2020-10-19 LAB — CUP PACEART REMOTE DEVICE CHECK
Battery Remaining Longevity: 128 mo
Battery Voltage: 3 V
Brady Statistic RV Percent Paced: 0.01 %
Date Time Interrogation Session: 20211207061605
HighPow Impedance: 53 Ohm
HighPow Impedance: 66 Ohm
Implantable Lead Implant Date: 20110627
Implantable Lead Location: 753860
Implantable Lead Model: 6947
Implantable Pulse Generator Implant Date: 20200602
Lead Channel Impedance Value: 399 Ohm
Lead Channel Impedance Value: 494 Ohm
Lead Channel Pacing Threshold Amplitude: 0.75 V
Lead Channel Pacing Threshold Pulse Width: 0.4 ms
Lead Channel Sensing Intrinsic Amplitude: 8.875 mV
Lead Channel Sensing Intrinsic Amplitude: 8.875 mV
Lead Channel Setting Pacing Amplitude: 2.5 V
Lead Channel Setting Pacing Pulse Width: 0.4 ms
Lead Channel Setting Sensing Sensitivity: 0.3 mV

## 2020-10-28 NOTE — Progress Notes (Signed)
Remote ICD transmission.   

## 2020-11-11 ENCOUNTER — Other Ambulatory Visit: Payer: Self-pay

## 2020-11-11 ENCOUNTER — Ambulatory Visit
Admission: RE | Admit: 2020-11-11 | Discharge: 2020-11-11 | Disposition: A | Discharge status: Home / Self Care | Payer: Medicare PPO | Source: Ambulatory Visit | Attending: Internal Medicine | Admitting: Internal Medicine

## 2020-11-11 DIAGNOSIS — Z1231 Encounter for screening mammogram for malignant neoplasm of breast: Secondary | ICD-10-CM | POA: Insufficient documentation

## 2020-11-23 IMAGING — MG DIGITAL SCREENING BILAT W/ TOMO W/ CAD
6 of 10 series · 6 of 30 positions shown · non-contrast
Comparison: Previous exam(s).

ACR Breast Density Category a: The breast tissue is almost entirely
fatty.

CLINICAL DATA: Screening.

EXAM:
DIGITAL SCREENING BILATERAL MAMMOGRAM WITH TOMO AND CAD

[L MLO synth-2D]
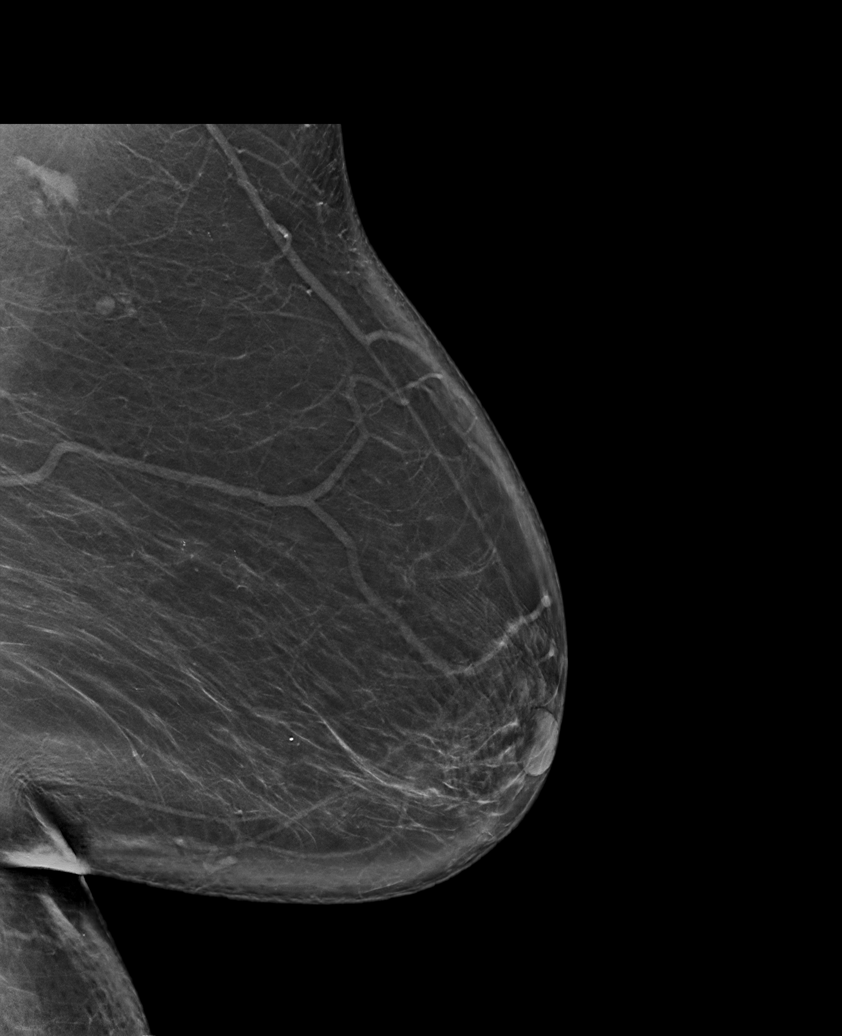

[R CC synth-2D]
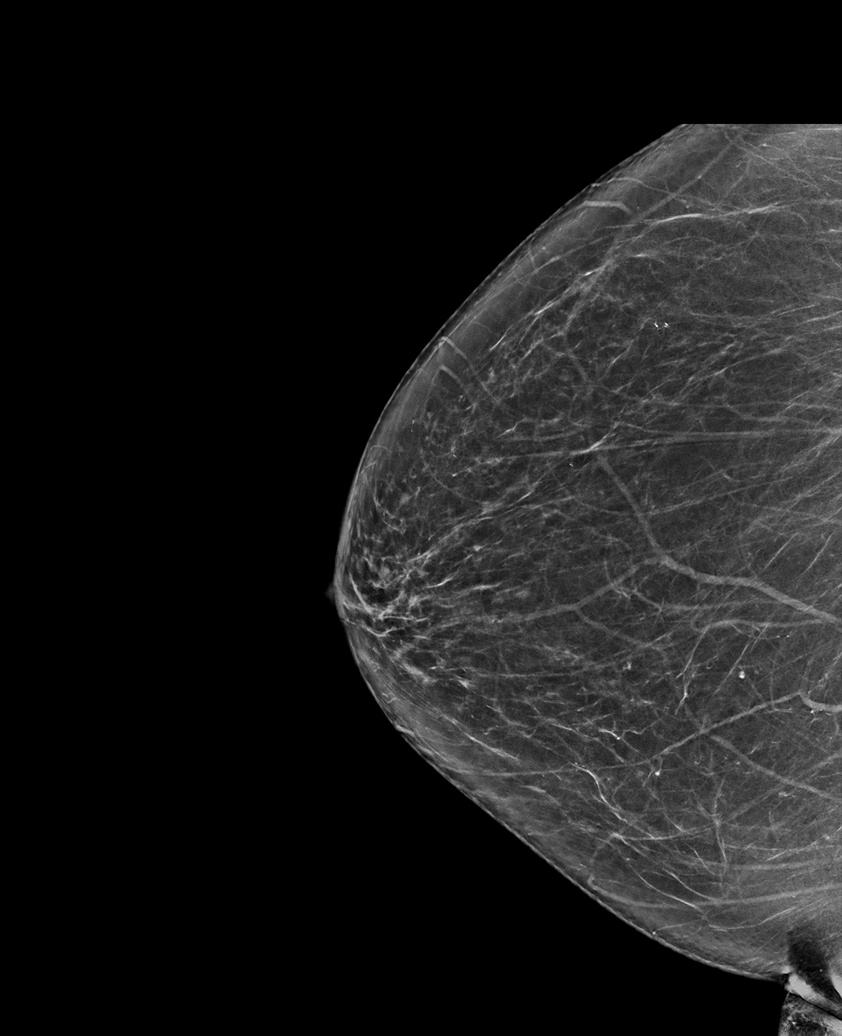

[R MLO synth-2D (1 of 2)]
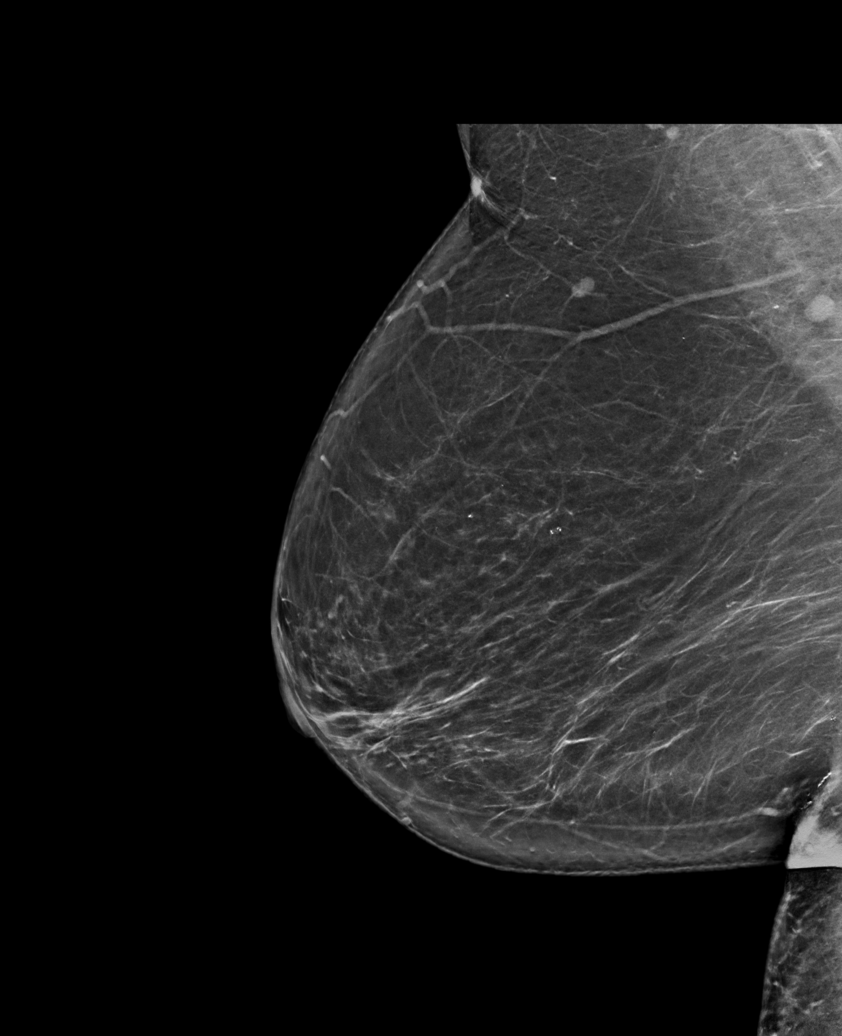

[L CC synth-2D]
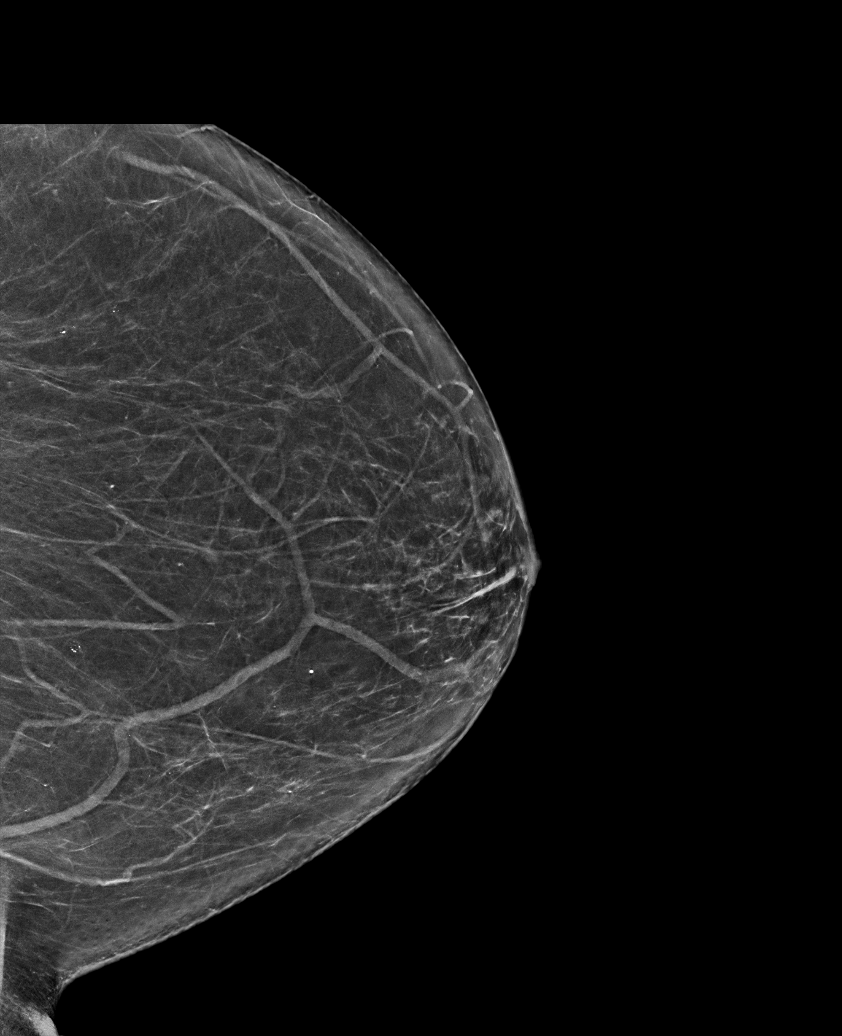

[R MLO synth-2D (2 of 2)]
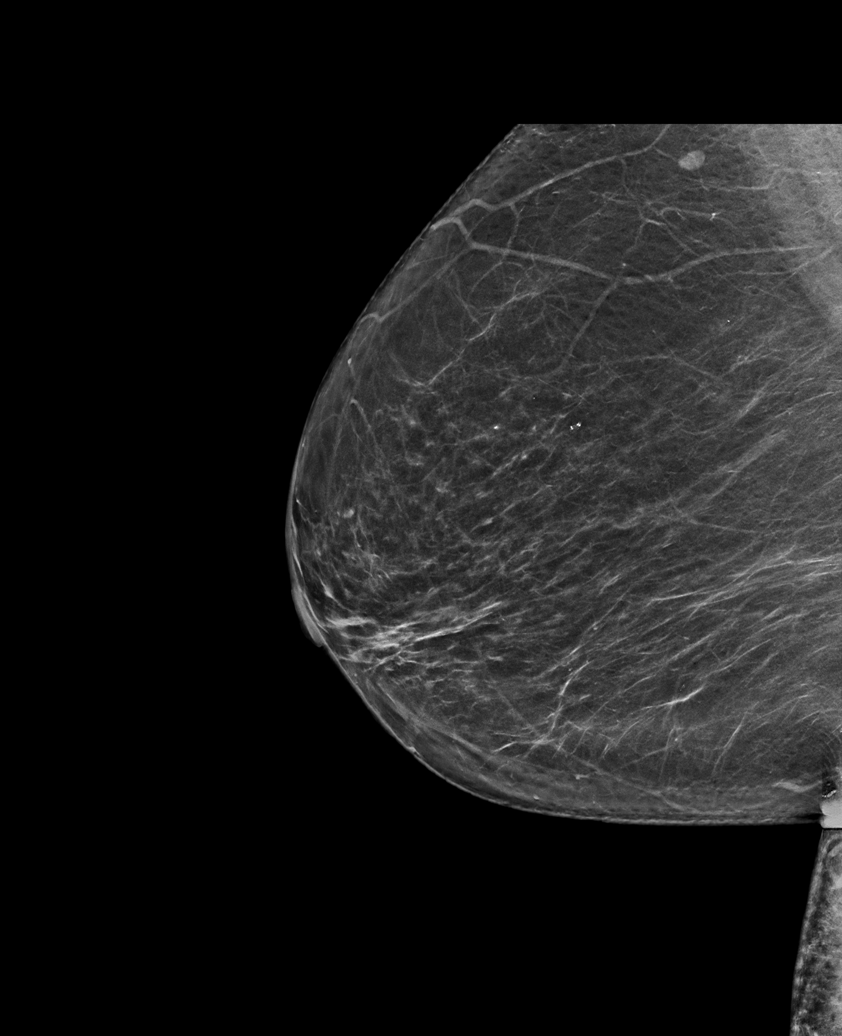

[R CC tomo · tomo slice 37/74.0]
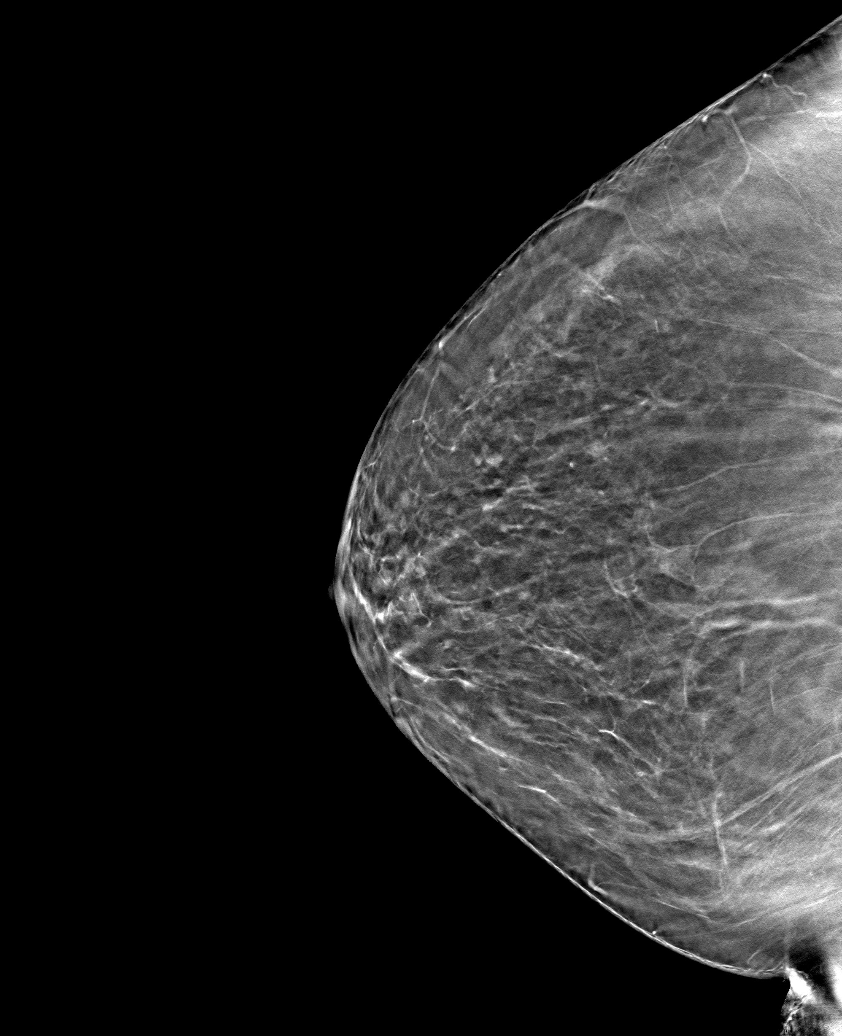

[6 of 30 positions shown; findings below may reference images not displayed]

FINDINGS: There are no findings suspicious for malignancy. Images were
processed with CAD.
IMPRESSION: No mammographic evidence of malignancy. A result letter of this
screening mammogram will be mailed directly to the patient.

RECOMMENDATION:
Screening mammogram in one year. (Code:[id number]-[id number])

BI-RADS CATEGORY  1: Negative.

## 2020-12-21 ENCOUNTER — Other Ambulatory Visit: Payer: Self-pay | Admitting: Cardiovascular Disease

## 2020-12-28 ENCOUNTER — Ambulatory Visit (INDEPENDENT_AMBULATORY_CARE_PROVIDER_SITE_OTHER): Payer: Medicare PPO | Admitting: Cardiovascular Disease

## 2020-12-28 ENCOUNTER — Other Ambulatory Visit: Payer: Self-pay

## 2020-12-28 ENCOUNTER — Encounter: Payer: Self-pay | Admitting: Cardiovascular Disease

## 2020-12-28 VITALS — BP 136/78 | HR 83 | Ht 71.0 in | Wt 209.4 lb

## 2020-12-28 DIAGNOSIS — I2510 Atherosclerotic heart disease of native coronary artery without angina pectoris: Secondary | ICD-10-CM

## 2020-12-28 DIAGNOSIS — I5042 Chronic combined systolic (congestive) and diastolic (congestive) heart failure: Secondary | ICD-10-CM

## 2020-12-28 LAB — LIPID PANEL
Chol/HDL Ratio: 2.7 ratio (ref 0.0–4.4)
Cholesterol, Total: 153 mg/dL (ref 100–199)
HDL: 56 mg/dL (ref >39)
LDL Chol Calc (NIH): 78 mg/dL (ref 0–99)
Triglycerides: 102 mg/dL (ref 0–149)
VLDL Cholesterol Cal: 19 mg/dL (ref 5–40)

## 2020-12-28 LAB — BASIC METABOLIC PANEL
BUN/Creatinine Ratio: 17 (ref 12–28)
BUN: 17 mg/dL (ref 8–27)
CO2: 25 mmol/L (ref 20–29)
Calcium: 10.2 mg/dL (ref 8.7–10.3)
Chloride: 100 mmol/L (ref 96–106)
Creatinine, Ser: 1 mg/dL (ref 0.57–1.00)
GFR calc Af Amer: 66 mL/min/{1.73_m2} (ref >59)
GFR calc non Af Amer: 58 mL/min/{1.73_m2} — ABNORMAL LOW (ref >59)
Glucose: 138 mg/dL — ABNORMAL HIGH (ref 65–99)
Potassium: 4.8 mmol/L (ref 3.5–5.2)
Sodium: 141 mmol/L (ref 134–144)

## 2020-12-28 LAB — ALT: ALT: 16 IU/L (ref 0–32)

## 2020-12-28 NOTE — Progress Notes (Signed)
Cardiology Office Note   Date:  12/28/2020   ID:  [name], DOB [birth year], MRN [id number]  PCP:  Gracelyn Nurse, MD  Cardiologist:   Kristeen Miss, MD   Chief Complaint  Patient presents with  . Hyperlipidemia  . Coronary Artery Disease   1. Coronary artery disease-status post anterior wall myocardial infarction. She status post stenting of her LAD in her Jan, 2010. We placed a 3.0 x 18 mm vision stent. Was post dilated with a 3.25 mm [place] Voyager 2. Chronic combined systolic and diastolic congestive heart failure: with EF of 30-35% associated with an apical aneurysm.   EF has improved to 45-50%.  3. Status post AICD placement  4. Paroxysmal atrial fibrillation 5. Dyslipidemia 6.   [name] is a 70 year old female with a history of coronary artery disease. She status post anterior wall myocardial infarction. She has congestive heart failure with an ejection fraction of around 35%.  She's done very well since I last saw her. He is exercising on a fairly regular basis. She's not had any episodes of chest or shortness breath. She has done well. - walks every day. No chest pain or dyspnea.   She has not been as careful with her diet recently. She admits that she needs to work on a good low-fat, low carbohydrate diet.  April 15, 2013:  [name] is doing well. No CP or dyspnea. Still walks every day ( 3/4 of a mile). She is due to have an echo.   Dec. 2, 2014:  [name] is doing well. No CP or dyspnea. Still walking (3 laps around the mall Or 30 minutes) Every other day. Echocardiogram in June, 2014 sutures a persistently reduced left ventricular systolic function with an EF of around 30%. She has mild pulmonary hypertension with estimated PA pressure of 40. Left ventricle: Akinesis septum. Severe hypokinesis of the apex. The cavity size was normal. Wall thickness was increased in a pattern of mild LVH. The estimated ejection fraction was 30%. Findings consistent with  left ventricular diastolic dysfunction. - Mitral valve: Mild regurgitation. - Left atrium: The atrium was mildly dilated. - Pulmonary arteries: PA peak pressure: 63mm Hg    April 22, 2014:  [name] feels nervous today. HR and BP are up. She denies any chest pain or shortness of breath. She has had atrial fibrillation before. EKG today reveals atrial fibrillation with a ventricular rate of 136.  July 01, 2014:  [name] is doing . She's not having episodes of chest pain or shortness of breath. She's able to get out and exercise on a fairly regular basis.    January 27, 2015:   [name] is a 70 y.o. female who presents for her CAD. Doing  Well.  Exercising regulalry.   No CP with exercise.  Has lost some weight.   20 labs.  Checks her BP regularly  - its ok at home.  Watches her salt  Sept. 14, 2016: Doing well.   No CP , no dyspnea.  Exercising regularly, watching her diet Her husband died of a cardiac arrest on 04/10/2023.  She is still upset   February 02, 2016:  Walking every day  No CP .  Has not been able to lose weight .   Sept. 20, 2017 Feeling well.  No CP or dyspnea Getting some exercise   March 21,2018:    Doing well.  No CP , feeling well  Is working out at [employer] now.  No  CP or dyspnea  Oct. 23, 2018:  No CP or dyspnea  No palpitations Feeling well No syncope or presyncope Working out regularly   March 12, 2018:  [name] is seen today for follow-up visit.  She has a history of coronary artery disease and congestive heart failure.  She also has paroxysmal atrial fibrillation.  She has a history of hyperlipidemia. Her sister diet recently at age 35.  No CP or dyspnea  Getting some exercise   BP has been up related to stress of sisters death     2019/11/24:  [name] is seen today for follow-up of her coronary artery disease, hyperlipidemia, congestive heart failure.  She also has a history of paroxysmal atrial fibrillation. Her last  echocardiogram was October, 2018.  At that time her left ventricular systolic function had improved to 45 to 50%.  She still has akinesis of the anteroseptal myocardium.  She has mild to moderate mitral regurgitation. She has moderate pulmonary hypertension with an estimated PA pressure of 44 mmHg. Staying healthy,  Still fairly active Wt is 225 lbs.  VS looks good.  still eating sweets  Discussed avoiding " white, wheat, and sweats"  Feb. 15, 2022:  Doing well.  No CP , no palpitations .  Had ICD change out in June, 2020 Getting some exercise  EF is 45 - 50 %   Past Medical History:  Diagnosis Date  . AICD (automatic cardioverter/defibrillator) present 05/09/2010   Medtronic Virtuoso  . CHF (congestive heart failure) (HCC)    EF 30-35% BY ECHO. SHE HAS AN APICAL ANEURYSM  . Coronary artery disease 12/12/2008   STATUS POST ANTERIOR WALL MYOCARDIAL INFARCTION. STATUS POST PTCA AND STENTING OF HER LAD  . Dyslipidemia   . Hypertension   . Myocardial infarction (HCC) 12/12/2008  . Presence of permanent cardiac pacemaker   . Transient atrial fibrillation or flutter     Past Surgical History:  Procedure Laterality Date  . CARDIAC CATHETERIZATION  12/12/2008   stent  . CARDIAC DEFIBRILLATOR PLACEMENT  05/09/2010   Medtronic Virtuoso  . CATARACT EXTRACTION W/PHACO Right 05/26/2020   Procedure: CATARACT EXTRACTION PHACO AND INTRAOCULAR LENS PLACEMENT (IOC) RIGHT;  Surgeon: Lockie Mola, MD;  Location: [facility];  Service: Ophthalmology;  Laterality: Right;  13.77 1:26.4 15.9%  . CATARACT EXTRACTION W/PHACO Left 08/18/2020   Procedure: CATARACT EXTRACTION PHACO AND INTRAOCULAR LENS PLACEMENT (IOC) LEFT;  Surgeon: Lockie Mola, MD;  Location: [facility];  Service: Ophthalmology;  Laterality: Left;  4.87 0:57.0 8.6%  . COLONOSCOPY    . ICD GENERATOR CHANGEOUT N/A 04/15/2019   Procedure: ICD GENERATOR CHANGEOUT;  Surgeon: Marinus Maw, MD;  Location:  [facility] INVASIVE CV LAB;  Service: Cardiovascular;  Laterality: N/A;     Current Outpatient Medications  Medication Sig Dispense Refill  . amiodarone (PACERONE) 200 MG tablet TAKE 1 TABLET BY MOUTH TWICE A WEEK 24 tablet 2  . atorvastatin (LIPITOR) 80 MG tablet Take 1 tablet by mouth once daily 90 tablet 3  . Calcium Carbonate-Vitamin D (CALCIUM 600+D PO) Take by mouth daily.    . carvedilol (COREG) 25 MG tablet TAKE 1 TABLET BY MOUTH TWICE DAILY WITH MEALS 180 tablet 2  . ELIQUIS 5 MG TABS tablet Take 1 tablet by mouth twice daily 180 tablet 1  . furosemide (LASIX) 40 MG tablet Take 1 tablet by mouth once daily 90 tablet 3  . lisinopril (ZESTRIL) 20 MG tablet Take 1 tablet by mouth once daily 90 tablet  3  . Multiple Vitamin (MULTIVITAMIN WITH MINERALS) TABS tablet Take 1 tablet by mouth daily.    . potassium chloride (KLOR-CON) 10 MEQ tablet Take 1 tablet by mouth once daily 90 tablet 2  . spironolactone (ALDACTONE) 25 MG tablet Take 1 tablet by mouth once daily 90 tablet 2   No current facility-administered medications for this visit.    Allergies:   Patient has no known allergies.    Social History:  The patient  reports that she quit smoking about 12 years ago. She has never used smokeless tobacco. She reports that she does not drink alcohol and does not use drugs.   Family History:  The patient's family history includes Heart disease in her brother and sister; Hypertension in her mother.    ROS:  Noted in current history.  Otherwise review of systems is negative  Physical Exam: Blood pressure 136/78, pulse 83, height 5\' 11"  (1.803 m), weight 209 lb 6.4 oz (95 kg), SpO2 98 %.  GEN:  Well nourished, well developed in no acute distress HEENT: Normal NECK: No JVD; No carotid bruits LYMPHATICS: No lymphadenopathy CARDIAC: RRR , no murmurs, rubs, gallops RESPIRATORY:  Clear to auscultation without rales, wheezing or rhonchi  ABDOMEN: Soft, non-tender, non-distended MUSCULOSKELETAL:   No edema; No deformity  SKIN: Warm and dry NEUROLOGIC:  Alert and oriented x 3    EKG:    Recent Labs: 06/25/2020: Hemoglobin 12.7; Platelets 218; TSH 3.210    Lipid Panel    Component Value Date/Time   CHOL 137 12/23/2019 0953   TRIG 109 12/23/2019 0953   HDL 50 12/23/2019 0953   CHOLHDL 2.7 12/23/2019 0953   CHOLHDL 2.4 05/10/2016 0743   VLDL 18 05/10/2016 0743   LDLCALC 67 12/23/2019 0953      Wt Readings from Last 3 Encounters:  12/28/20 209 lb 6.4 oz (95 kg)  08/18/20 215 lb (97.5 kg)  07/22/20 220 lb (99.8 kg)      Other studies Reviewed: Additional studies/ records that were reviewed today include: . Review of the above records demonstrates:    ASSESSMENT AND PLAN:  1. Coronary artery disease-    She denies any angina.  Cont current meds  2. Congestive heart failure :   Ejection fraction has improved.  Her last EF was 45 to 50%.  I encouraged her to continue with diet, exercise weight loss.  She needs to avoid eating salty foods.  3. Status post AICD placement -  Managed by Dr. 09/21/20   4. Paroxysmal atrial fibrillation -   continue Eliquis.  5. Dyslipidemia -    continue current medications.  Check lipids and ALT today.    Current medicines are reviewed at length with the patient today.  The patient does not have concerns regarding medicines.  The following changes have been made:  no change  Labs/ tests ordered today include:   Orders Placed This Encounter  Procedures  . ALT  . Basic metabolic panel  . Lipid panel     Disposition:   FU with me in 1 year     Signed, Ladona Ridgel, MD  12/28/2020 9:52 AM    [facility] [address] [place], [place], [place]  [place] Phone: [phone]; Fax: [phone]

## 2020-12-28 NOTE — Patient Instructions (Signed)
Medication Instructions:  Your physician recommends that you continue on your current medications as directed. Please refer to the Current Medication list given to you today.  *If you need a refill on your cardiac medications before your next appointment, please call your pharmacy*   Lab Work: TODAY: LIPIDS, ALT, BMET If you have labs (blood work) drawn today and your tests are completely normal, you will receive your results only by: . MyChart Message (if you have MyChart) OR . A paper copy in the mail If you have any lab test that is abnormal or we need to change your treatment, we will call you to review the results.   Testing/Procedures: none   Follow-Up: At [facility], you and your health needs are our priority.  As part of our continuing mission to provide you with exceptional heart care, we have created designated Provider Care Teams.  These Care Teams include your primary Cardiologist (physician) and Advanced Practice Providers (APPs -  Physician Assistants and Nurse Practitioners) who all work together to provide you with the care you need, when you need it.  We recommend signing up for the patient portal called "MyChart".  Sign up information is provided on this After Visit Summary.  MyChart is used to connect with patients for Virtual Visits (Telemedicine).  Patients are able to view lab/test results, encounter notes, upcoming appointments, etc.  Non-urgent messages can be sent to your provider as well.   To learn more about what you can do with MyChart, go to [email or web address].    Your next appointment:   1 year(s)  The format for your next appointment:   In Person  Provider:   You may see Philip Nahser, MD or one of the following Advanced Practice Providers on your designated Care Team:    Scott Weaver, PA-C  Vin Bhagat, PA-C    

## 2021-01-04 ENCOUNTER — Other Ambulatory Visit: Payer: Self-pay | Admitting: Cardiovascular Disease

## 2021-01-18 ENCOUNTER — Ambulatory Visit (INDEPENDENT_AMBULATORY_CARE_PROVIDER_SITE_OTHER): Payer: Medicare PPO

## 2021-01-18 ENCOUNTER — Other Ambulatory Visit: Payer: Self-pay | Admitting: Cardiovascular Disease

## 2021-01-18 DIAGNOSIS — I428 Other cardiomyopathies: Secondary | ICD-10-CM | POA: Diagnosis not present

## 2021-01-18 LAB — CUP PACEART REMOTE DEVICE CHECK
Battery Remaining Longevity: 126 mo
Battery Voltage: 2.99 V
Brady Statistic RV Percent Paced: 0.01 %
Date Time Interrogation Session: 20220308042405
HighPow Impedance: 51 Ohm
HighPow Impedance: 60 Ohm
Implantable Lead Implant Date: 20110627
Implantable Lead Location: 753860
Implantable Lead Model: 6947
Implantable Pulse Generator Implant Date: 20200602
Lead Channel Impedance Value: 380 Ohm
Lead Channel Impedance Value: 437 Ohm
Lead Channel Pacing Threshold Amplitude: 0.75 V
Lead Channel Pacing Threshold Pulse Width: 0.4 ms
Lead Channel Sensing Intrinsic Amplitude: 7.625 mV
Lead Channel Sensing Intrinsic Amplitude: 7.625 mV
Lead Channel Setting Pacing Amplitude: 2.5 V
Lead Channel Setting Pacing Pulse Width: 0.4 ms
Lead Channel Setting Sensing Sensitivity: 0.3 mV

## 2021-01-18 NOTE — Telephone Encounter (Signed)
Prescription refill request for Eliquis received. Indication: PAF Last office visit: 12/28/20 Scr: 1.00 on 12/28/20 Age: 70 Weight: 95kg  Based on above findings Eliquis 5mg  twice daily is the appropriate dose.  Refill approved.

## 2021-01-26 NOTE — Progress Notes (Signed)
Remote ICD transmission.   

## 2021-02-01 ENCOUNTER — Other Ambulatory Visit: Payer: Self-pay | Admitting: Cardiovascular Disease

## 2021-02-01 DIAGNOSIS — I2583 Coronary atherosclerosis due to lipid rich plaque: Secondary | ICD-10-CM

## 2021-02-01 DIAGNOSIS — I2510 Atherosclerotic heart disease of native coronary artery without angina pectoris: Secondary | ICD-10-CM

## 2021-04-19 ENCOUNTER — Ambulatory Visit (INDEPENDENT_AMBULATORY_CARE_PROVIDER_SITE_OTHER): Payer: Medicare PPO

## 2021-04-19 DIAGNOSIS — I428 Other cardiomyopathies: Secondary | ICD-10-CM | POA: Diagnosis not present

## 2021-04-19 LAB — CUP PACEART REMOTE DEVICE CHECK
Battery Remaining Longevity: 124 mo
Battery Voltage: 3 V
Brady Statistic RV Percent Paced: 0.01 %
Date Time Interrogation Session: 20220607053327
HighPow Impedance: 54 Ohm
HighPow Impedance: 74 Ohm
Implantable Lead Implant Date: 20110627
Implantable Lead Location: 753860
Implantable Lead Model: 6947
Implantable Pulse Generator Implant Date: 20200602
Lead Channel Impedance Value: 399 Ohm
Lead Channel Impedance Value: 456 Ohm
Lead Channel Pacing Threshold Amplitude: 0.75 V
Lead Channel Pacing Threshold Pulse Width: 0.4 ms
Lead Channel Sensing Intrinsic Amplitude: 8.625 mV
Lead Channel Sensing Intrinsic Amplitude: 8.625 mV
Lead Channel Setting Pacing Amplitude: 2.5 V
Lead Channel Setting Pacing Pulse Width: 0.4 ms
Lead Channel Setting Sensing Sensitivity: 0.3 mV

## 2021-05-11 ENCOUNTER — Other Ambulatory Visit: Payer: Self-pay | Admitting: Cardiovascular Disease

## 2021-05-11 DIAGNOSIS — I2510 Atherosclerotic heart disease of native coronary artery without angina pectoris: Secondary | ICD-10-CM

## 2021-05-11 NOTE — Progress Notes (Signed)
Remote ICD transmission.   

## 2021-07-19 ENCOUNTER — Other Ambulatory Visit: Payer: Self-pay | Admitting: Cardiovascular Disease

## 2021-07-19 ENCOUNTER — Ambulatory Visit (INDEPENDENT_AMBULATORY_CARE_PROVIDER_SITE_OTHER): Payer: Medicare PPO

## 2021-07-19 DIAGNOSIS — I428 Other cardiomyopathies: Secondary | ICD-10-CM | POA: Diagnosis not present

## 2021-07-19 LAB — CUP PACEART REMOTE DEVICE CHECK
Battery Remaining Longevity: 122 mo
Battery Voltage: 2.99 V
Brady Statistic RV Percent Paced: 0.01 %
Date Time Interrogation Session: 20220906012503
HighPow Impedance: 55 Ohm
HighPow Impedance: 72 Ohm
Implantable Lead Implant Date: 20110627
Implantable Lead Location: 753860
Implantable Lead Model: 6947
Implantable Pulse Generator Implant Date: 20200602
Lead Channel Impedance Value: 380 Ohm
Lead Channel Impedance Value: 456 Ohm
Lead Channel Pacing Threshold Amplitude: 0.75 V
Lead Channel Pacing Threshold Pulse Width: 0.4 ms
Lead Channel Sensing Intrinsic Amplitude: 9.5 mV
Lead Channel Sensing Intrinsic Amplitude: 9.5 mV
Lead Channel Setting Pacing Amplitude: 2.5 V
Lead Channel Setting Pacing Pulse Width: 0.4 ms
Lead Channel Setting Sensing Sensitivity: 0.3 mV

## 2021-07-19 NOTE — Telephone Encounter (Signed)
Prescription refill request for Eliquis received.  Indication: afib  Last office visit: Nahser, 12/28/2020 Scr: 1.0, 01/17/2021 Age: 70 yo  Weight: 95 kg   Refill sent.

## 2021-07-27 NOTE — Progress Notes (Signed)
Remote ICD transmission.   

## 2021-08-03 ENCOUNTER — Other Ambulatory Visit: Payer: Self-pay

## 2021-08-03 ENCOUNTER — Encounter: Payer: Self-pay | Admitting: Internal Medicine

## 2021-08-03 ENCOUNTER — Ambulatory Visit (INDEPENDENT_AMBULATORY_CARE_PROVIDER_SITE_OTHER): Payer: Medicare PPO | Admitting: Internal Medicine

## 2021-08-03 VITALS — BP 130/72 | HR 68 | Ht 69.0 in | Wt 209.2 lb

## 2021-08-03 DIAGNOSIS — I480 Paroxysmal atrial fibrillation: Secondary | ICD-10-CM

## 2021-08-03 NOTE — Progress Notes (Signed)
HPI [name] returns today for followup. She is a pleasant 70 yo woman with an ICM, chronic systolic heart failure, PAF, and s/p ICD insertion. She underwent gen change out 2 years ago. She denies chest pain or sob but she notes that she has not been quite as active and has gained a few pounds.  No Known Allergies   Current Outpatient Medications  Medication Sig Dispense Refill   amiodarone (PACERONE) 200 MG tablet TAKE 1 TABLET BY MOUTH TWICE A WEEK 24 tablet 3   apixaban (ELIQUIS) 5 MG TABS tablet Take 1 tablet by mouth twice daily 180 tablet 1   atorvastatin (LIPITOR) 80 MG tablet Take 1 tablet by mouth once daily 90 tablet 2   Calcium Carbonate-Vitamin D (CALCIUM 600+D PO) Take by mouth daily.     carvedilol (COREG) 25 MG tablet TAKE 1 TABLET BY MOUTH TWICE DAILY WITH MEALS 180 tablet 2   furosemide (LASIX) 40 MG tablet Take 1 tablet by mouth once daily 90 tablet 2   lisinopril (ZESTRIL) 20 MG tablet Take 1 tablet by mouth once daily 90 tablet 3   Multiple Vitamin (MULTIVITAMIN WITH MINERALS) TABS tablet Take 1 tablet by mouth daily.     potassium chloride (KLOR-CON) 10 MEQ tablet Take 1 tablet by mouth once daily 90 tablet 2   spironolactone (ALDACTONE) 25 MG tablet Take 1 tablet by mouth once daily 90 tablet 2   No current facility-administered medications for this visit.     Past Medical History:  Diagnosis Date   AICD (automatic cardioverter/defibrillator) present 05/09/2010   Medtronic Virtuoso   CHF (congestive heart failure) (HCC)    EF 30-35% BY ECHO. SHE HAS AN APICAL ANEURYSM   Coronary artery disease 12/12/2008   STATUS POST ANTERIOR WALL MYOCARDIAL INFARCTION. STATUS POST PTCA AND STENTING OF HER LAD   Dyslipidemia    Hypertension    Myocardial infarction (HCC) 12/12/2008   Presence of permanent cardiac pacemaker    Transient atrial fibrillation or flutter     ROS:   All systems reviewed and negative except as noted in the HPI.   Past Surgical  History:  Procedure Laterality Date   CARDIAC CATHETERIZATION  12/12/2008   stent   CARDIAC DEFIBRILLATOR PLACEMENT  05/09/2010   Medtronic Virtuoso   CATARACT EXTRACTION W/PHACO Right 05/26/2020   Procedure: CATARACT EXTRACTION PHACO AND INTRAOCULAR LENS PLACEMENT (IOC) RIGHT;  Surgeon: Lockie Mola, MD;  Location: [facility];  Service: Ophthalmology;  Laterality: Right;  13.77 1:26.4 15.9%   CATARACT EXTRACTION W/PHACO Left 08/18/2020   Procedure: CATARACT EXTRACTION PHACO AND INTRAOCULAR LENS PLACEMENT (IOC) LEFT;  Surgeon: Lockie Mola, MD;  Location: [facility];  Service: Ophthalmology;  Laterality: Left;  4.87 0:57.0 8.6%   COLONOSCOPY     ICD GENERATOR CHANGEOUT N/A 04/15/2019   Procedure: ICD GENERATOR CHANGEOUT;  Surgeon: Marinus Maw, MD;  Location: [facility] INVASIVE CV LAB;  Service: Cardiovascular;  Laterality: N/A;     Family History  Problem Relation Age of Onset   Hypertension Mother    Heart disease Sister    Heart disease Brother    Breast cancer Neg Hx      Social History   Socioeconomic History   Marital status: Widowed    Spouse name: Not on file   Number of children: Not on file   Years of education: Not on file   Highest education level: Not on file  Occupational History   Not on file  Tobacco Use   Smoking status: Former    Types: Cigarettes    Quit date: 12/14/2008    Years since quitting: 12.6   Smokeless tobacco: Never  Vaping Use   Vaping Use: Never used  Substance and Sexual Activity   Alcohol use: No   Drug use: No   Sexual activity: Not on file  Other Topics Concern   Not on file  Social History Narrative   Not on file   Social Determinants of Health   Financial Resource Strain: Not on file  Food Insecurity: Not on file  Transportation Needs: Not on file  Physical Activity: Not on file  Stress: Not on file  Social Connections: Not on file  Intimate Partner Violence: Not on file     There were no  vitals taken for this visit.  Physical Exam:  Well appearing NAD HEENT: Unremarkable Neck:  No JVD, no thyromegally Lymphatics:  No adenopathy Back:  No CVA tenderness Lungs:  Clear HEART:  Regular rate rhythm, no murmurs, no rubs, no clicks Abd:  soft, positive bowel sounds, no organomegally, no rebound, no guarding Ext:  2 plus pulses, no edema, no cyanosis, no clubbing Skin:  No rashes no nodules Neuro:  CN II through XII intact, motor grossly intact  EKG - nsr with AS MI  DEVICE  Normal device function.  See PaceArt for details.   Assess/Plan:  1. Chronic systolic heart failure -her symptoms are class 2. She is encouraged to increase her physical activity. 2. HTN - her bp is a little high today. I asked her to try and lose weight. 3. ICD - her Medtronic DDD ICD is working normally. We will recheck in several months. 4. PAF - she is maintaining NSR. She will continue her current meds. I encouraged her to lose weight.   Leonia Reeves.D.

## 2021-08-03 NOTE — Patient Instructions (Signed)

## 2021-08-23 ENCOUNTER — Other Ambulatory Visit: Payer: Self-pay | Admitting: Internal Medicine

## 2021-09-13 ENCOUNTER — Other Ambulatory Visit: Payer: Self-pay | Admitting: Cardiovascular Disease

## 2021-09-20 ENCOUNTER — Other Ambulatory Visit: Payer: Self-pay | Admitting: Internal Medicine

## 2021-09-20 DIAGNOSIS — Z1231 Encounter for screening mammogram for malignant neoplasm of breast: Secondary | ICD-10-CM

## 2021-09-27 ENCOUNTER — Other Ambulatory Visit: Payer: Self-pay | Admitting: Cardiovascular Disease

## 2021-10-18 ENCOUNTER — Ambulatory Visit (INDEPENDENT_AMBULATORY_CARE_PROVIDER_SITE_OTHER): Payer: Medicare PPO

## 2021-10-18 DIAGNOSIS — I255 Ischemic cardiomyopathy: Secondary | ICD-10-CM

## 2021-10-18 LAB — CUP PACEART REMOTE DEVICE CHECK
Battery Remaining Longevity: 120 mo
Battery Voltage: 2.98 V
Brady Statistic RV Percent Paced: 0.01 %
Date Time Interrogation Session: 20221206012505
HighPow Impedance: 54 Ohm
HighPow Impedance: 73 Ohm
Implantable Lead Implant Date: 20110627
Implantable Lead Location: 753860
Implantable Lead Model: 6947
Implantable Pulse Generator Implant Date: 20200602
Lead Channel Impedance Value: 399 Ohm
Lead Channel Impedance Value: 494 Ohm
Lead Channel Pacing Threshold Amplitude: 0.75 V
Lead Channel Pacing Threshold Pulse Width: 0.4 ms
Lead Channel Sensing Intrinsic Amplitude: 9.5 mV
Lead Channel Sensing Intrinsic Amplitude: 9.5 mV
Lead Channel Setting Pacing Amplitude: 2.5 V
Lead Channel Setting Pacing Pulse Width: 0.4 ms
Lead Channel Setting Sensing Sensitivity: 0.3 mV

## 2021-10-26 NOTE — Progress Notes (Signed)
Remote ICD transmission.   

## 2021-11-15 ENCOUNTER — Ambulatory Visit
Admission: RE | Admit: 2021-11-15 | Discharge: 2021-11-15 | Disposition: A | Discharge status: Home / Self Care | Payer: Medicare PPO | Source: Ambulatory Visit | Attending: Internal Medicine | Admitting: Internal Medicine

## 2021-11-15 ENCOUNTER — Other Ambulatory Visit: Payer: Self-pay

## 2021-11-15 DIAGNOSIS — Z1231 Encounter for screening mammogram for malignant neoplasm of breast: Secondary | ICD-10-CM | POA: Diagnosis present

## 2021-11-29 ENCOUNTER — Other Ambulatory Visit: Payer: Self-pay | Admitting: Cardiovascular Disease

## 2021-12-27 ENCOUNTER — Other Ambulatory Visit: Payer: Self-pay | Admitting: Cardiovascular Disease

## 2022-01-17 ENCOUNTER — Ambulatory Visit (INDEPENDENT_AMBULATORY_CARE_PROVIDER_SITE_OTHER): Payer: Medicare PPO

## 2022-01-17 ENCOUNTER — Other Ambulatory Visit: Payer: Self-pay | Admitting: Cardiovascular Disease

## 2022-01-17 DIAGNOSIS — I2583 Coronary atherosclerosis due to lipid rich plaque: Secondary | ICD-10-CM

## 2022-01-17 DIAGNOSIS — I255 Ischemic cardiomyopathy: Secondary | ICD-10-CM | POA: Diagnosis not present

## 2022-01-17 DIAGNOSIS — I2510 Atherosclerotic heart disease of native coronary artery without angina pectoris: Secondary | ICD-10-CM

## 2022-01-17 LAB — CUP PACEART REMOTE DEVICE CHECK
Battery Remaining Longevity: 118 mo
Battery Voltage: 2.98 V
Brady Statistic RV Percent Paced: 0.14 %
Date Time Interrogation Session: 20230307012204
HighPow Impedance: 58 Ohm
HighPow Impedance: 78 Ohm
Implantable Lead Implant Date: 20110627
Implantable Lead Location: 753860
Implantable Lead Model: 6947
Implantable Pulse Generator Implant Date: 20200602
Lead Channel Impedance Value: 399 Ohm
Lead Channel Impedance Value: 494 Ohm
Lead Channel Pacing Threshold Amplitude: 0.75 V
Lead Channel Pacing Threshold Pulse Width: 0.4 ms
Lead Channel Sensing Intrinsic Amplitude: 8.5 mV
Lead Channel Sensing Intrinsic Amplitude: 8.5 mV
Lead Channel Setting Pacing Amplitude: 2.5 V
Lead Channel Setting Pacing Pulse Width: 0.4 ms
Lead Channel Setting Sensing Sensitivity: 0.3 mV

## 2022-01-17 NOTE — Telephone Encounter (Signed)
Pt last saw Dr Ladona Ridgel 08/03/21, last labs 07/26/21 Creat 1.0, age 71, weight 94.9kg, based on specified criteria pt is on appropriate dosage of Eliquis 5mg  BID for afib.  Will refill rx.  ?

## 2022-01-30 NOTE — Progress Notes (Signed)
Remote ICD transmission.   

## 2022-02-07 ENCOUNTER — Other Ambulatory Visit: Payer: Self-pay | Admitting: Cardiovascular Disease

## 2022-03-03 ENCOUNTER — Encounter: Payer: Self-pay | Admitting: Cardiovascular Disease

## 2022-03-03 ENCOUNTER — Ambulatory Visit (INDEPENDENT_AMBULATORY_CARE_PROVIDER_SITE_OTHER): Payer: Medicare PPO | Admitting: Cardiovascular Disease

## 2022-03-03 VITALS — BP 140/78 | HR 65 | Ht 71.0 in | Wt 217.8 lb

## 2022-03-03 DIAGNOSIS — I2583 Coronary atherosclerosis due to lipid rich plaque: Secondary | ICD-10-CM | POA: Diagnosis not present

## 2022-03-03 DIAGNOSIS — I2510 Atherosclerotic heart disease of native coronary artery without angina pectoris: Secondary | ICD-10-CM | POA: Diagnosis not present

## 2022-03-03 DIAGNOSIS — E782 Mixed hyperlipidemia: Secondary | ICD-10-CM

## 2022-03-03 DIAGNOSIS — I480 Paroxysmal atrial fibrillation: Secondary | ICD-10-CM | POA: Diagnosis not present

## 2022-03-03 MED ORDER — ATORVASTATIN CALCIUM 80 MG PO TABS: 80.0000 mg | ORAL_TABLET | Freq: Every day | ORAL | 3 refills | Status: DC

## 2022-03-03 NOTE — Progress Notes (Signed)
? ?Cardiology Office Note ? ? ?Date:  03/03/2022  ? ?ID:  [name], DOB 05/21/1951, MRN [id number] ? ?PCP:  Gracelyn Nurse, MD  ?Cardiologist:   Kristeen Miss, MD  ? ?Chief Complaint  ?Patient presents with  ? Coronary Artery Disease  ?   ?  ? Hyperlipidemia  ? Congestive Heart Failure  ? ?1. Coronary artery disease-status post anterior wall myocardial infarction. She status post stenting of her LAD in her Jan, 2010. We placed a 3.0 x 18 mm vision stent. Was post dilated with a 3.25 mm Olivet Voyager ?2. Chronic combined systolic and diastolic congestive heart failure: with EF of 30-35% associated with  an apical aneurysm.   EF has improved to 45-50%.  ?3. Status post AICD placement    ?4. Paroxysmal  atrial fibrillation ?5. Dyslipidemia ?6. ? ? ?[name] is a 71 year old female with a history of coronary artery disease. She status post anterior wall myocardial infarction. She has congestive heart failure with an ejection fraction of around 35%. ? ?She's done very well since I last saw her. He is exercising on a fairly regular basis. She's not had any episodes of chest or shortness breath.  She has done well.  - walks every day.  No chest pain or dyspnea.  ? ?She has not been as careful with her diet recently. She admits that she needs to work on a good low-fat, low carbohydrate diet. ? ?April 15, 2013: ? ?[name] is doing well.  No CP or dyspnea.  Still walks every day ( 3/4 of a mile).  She is due to have an echo.  ? ?Dec. 2, 2014: ? ?[name] is doing well.  No CP or dyspnea.    Still walking (3 laps around the mall  Or 30 minutes)  Every other day.  Echocardiogram in June, 2014 sutures a persistently reduced left ventricular systolic function with an EF of around 30%. She has mild pulmonary hypertension with estimated PA pressure of 40. ?Left ventricle: Akinesis septum. Severe hypokinesis of the ?apex. The cavity size was normal. Wall thickness was ?increased in a pattern of mild LVH. The estimated ejection ?fraction  was 30%. Findings consistent with left ?ventricular diastolic dysfunction. ?- Mitral valve: Mild regurgitation. ?- Left atrium: The atrium was mildly dilated. ?- Pulmonary arteries: PA peak pressure: 79mm Hg  ? ? ?April 22, 2014: ? ?[name] feels nervous today.  HR and BP are up. She denies any chest pain or shortness of breath. She has had atrial fibrillation before. EKG today reveals atrial fibrillation with a ventricular rate of 136. ? ?July 01, 2014: ? ?[name] is doing . She's not having episodes of chest pain or shortness of breath.  She's able to get out and exercise on a fairly regular basis. ? ?  ?January 27, 2015:   ?[name] is a 71 y.o. female who presents for her CAD. ?Doing  Well.  Exercising regulalry.   No CP with exercise.  Has lost some weight.   20 labs.  ?Checks her BP regularly  - its ok at home.  ?Watches her salt ? ?Sept. 14, 2016: ?Doing well.  ? ?No CP , no dyspnea.  ?Exercising regularly, watching her diet ?Her husband died of a cardiac arrest on 04-15-23.  She is still upset  ? ?February 02, 2016: ? ?Walking every day  ?No CP .  Has not been able to lose weight .  ? ?Sept. 20, 2017 ?Feeling well.  ?No CP or  dyspnea ?Getting some exercise  ? ?March 21,2018:   ? ?Doing well.  No CP , feeling well  ?Is working out at [employer] now.  No CP or dyspnea ? ?Oct. 23, 2018: ? ?No CP or dyspnea  ?No palpitations ?Feeling well ?No syncope or presyncope ?Working out regularly  ? ?March 12, 2018: ? ?[name] is seen today for follow-up visit.  She has a history of coronary artery disease and congestive heart failure.  She also has paroxysmal atrial fibrillation.  She has a history of hyperlipidemia. ?Her sister diet recently at age 67.  ?No CP or dyspnea  ?Getting some exercise  ? ?BP has been up related to stress of sisters death   ? ? ?2019/11/27: ? ?[name] is seen today for follow-up of her coronary artery disease, hyperlipidemia, congestive heart failure.  She also has a history of paroxysmal atrial  fibrillation. ?Her last echocardiogram was October, 2018.  At that time her left ventricular systolic function had improved to 45 to 50%.  She still has akinesis of the anteroseptal myocardium.  She has mild to moderate mitral regurgitation. ?She has moderate pulmonary hypertension with an estimated PA pressure of 44 mmHg. ?Staying healthy,  Still fairly active ?Wt is 225 lbs.  ?VS looks good. ? still eating sweets  ?Discussed avoiding " white, wheat, and sweats" ? ?Feb. 15, 2022: ? ?Doing well.  No CP , no palpitations .  ?Had ICD change out in June, 2020 ?Getting some exercise  ?EF is 45 - 50 %  ? ?March 03, 2022: ?[name] is seen today for follow-up visit.  She has a history of congestive heart failure, coronary artery disease, hyperlipidemia.  She also has  paroxysmal atrial fibrillation. ? ?She checks her bp at home - typically is ok  ?Wt is 217 lbs ( down 7 lbs )  ? ?Past Medical History:  ?Diagnosis Date  ? AICD (automatic cardioverter/defibrillator) present 05/09/2010  ? Medtronic Virtuoso  ? CHF (congestive heart failure) (HCC)   ? EF 30-35% BY ECHO. SHE HAS AN APICAL ANEURYSM  ? Coronary artery disease 12/12/2008  ? STATUS POST ANTERIOR WALL MYOCARDIAL INFARCTION. STATUS POST PTCA AND STENTING OF HER LAD  ? Dyslipidemia   ? Hypertension   ? Myocardial infarction (HCC) 12/12/2008  ? Presence of permanent cardiac pacemaker   ? Transient atrial fibrillation or flutter   ? ? ?Past Surgical History:  ?Procedure Laterality Date  ? CARDIAC CATHETERIZATION  12/12/2008  ? stent  ? CARDIAC DEFIBRILLATOR PLACEMENT  05/09/2010  ? Medtronic Virtuoso  ? CATARACT EXTRACTION W/PHACO Right 05/26/2020  ? Procedure: CATARACT EXTRACTION PHACO AND INTRAOCULAR LENS PLACEMENT (IOC) RIGHT;  Surgeon: Lockie Mola, MD;  Location: [facility];  Service: Ophthalmology;  Laterality: Right;  13.77 ?1:26.4 ?15.9%  ? CATARACT EXTRACTION W/PHACO Left 08/18/2020  ? Procedure: CATARACT EXTRACTION PHACO AND INTRAOCULAR LENS  PLACEMENT (IOC) LEFT;  Surgeon: Lockie Mola, MD;  Location: [facility];  Service: Ophthalmology;  Laterality: Left;  4.87 ?0:57.0 ?8.6%  ? COLONOSCOPY    ? ICD GENERATOR CHANGEOUT N/A 04/15/2019  ? Procedure: ICD GENERATOR CHANGEOUT;  Surgeon: Marinus Maw, MD;  Location: [facility] INVASIVE CV LAB;  Service: Cardiovascular;  Laterality: N/A;  ? ? ? ?Current Outpatient Medications  ?Medication Sig Dispense Refill  ? amiodarone (PACERONE) 200 MG tablet TAKE 1 TABLET BY MOUTH TWICE A WEEK 24 tablet 2  ? apixaban (ELIQUIS) 5 MG TABS tablet Take 1 tablet by mouth twice daily 180 tablet  1  ? Calcium Carbonate-Vitamin D (CALCIUM 600+D PO) Take by mouth daily.    ? carvedilol (COREG) 25 MG tablet TAKE 1 TABLET BY MOUTH TWICE DAILY WITH MEALS 180 tablet 3  ? furosemide (LASIX) 40 MG tablet Take 1 tablet by mouth once daily 90 tablet 1  ? lisinopril (ZESTRIL) 20 MG tablet Take 1 tablet by mouth once daily 90 tablet 3  ? Multiple Vitamin (MULTIVITAMIN WITH MINERALS) TABS tablet Take 1 tablet by mouth daily.    ? potassium chloride (KLOR-CON) 10 MEQ tablet Take 1 tablet by mouth daily 90 tablet 02  ? spironolactone (ALDACTONE) 25 MG tablet Take 1 tablet by mouth once daily 90 tablet 3  ? atorvastatin (LIPITOR) 80 MG tablet Take 1 tablet (80 mg total) by mouth daily. 90 tablet 3  ? ?No current facility-administered medications for this visit.  ? ? ?Allergies:   Patient has no known allergies.  ? ? ?Social History:  The patient  reports that she quit smoking about 13 years ago. Her smoking use included cigarettes. She has never used smokeless tobacco. She reports that she does not drink alcohol and does not use drugs.  ? ?Family History:  The patient's family history includes Heart disease in her brother and sister; Hypertension in her mother.  ? ? ?ROS:  ?Noted in current history.  Otherwise review of systems is negative ? ?Physical Exam: ?Blood pressure 140/78, pulse 65, height 5\' 11"  (1.803 m), weight 217 lb 12.8  oz (98.8 kg), SpO2 97 %. ? ?GEN:  Well nourished, well developed in no acute distress ?HEENT: Normal ?NECK: No JVD; No carotid bruits ?LYMPHATICS: No lymphadenopathy ?CARDIAC: RRR   ?RESPIRATORY:  Clear to auscu

## 2022-03-03 NOTE — Patient Instructions (Signed)
Medication Instructions:  ?Your physician recommends that you continue on your current medications as directed. Please refer to the Current Medication list given to you today. ? ?*If you need a refill on your cardiac medications before your next appointment, please call your pharmacy* ? ?Lab Work: ?TODAY: CBC, ALT, Lipids, BMP ?If you have labs (blood work) drawn today and your tests are completely normal, you will receive your results only by: ?MyChart Message (if you have MyChart) OR ?A paper copy in the mail ?If you have any lab test that is abnormal or we need to change your treatment, we will call you to review the results. ? ?Testing/Procedures: ?NONE ? ?Follow-Up: ?At [facility], you and your health needs are our priority.  As part of our continuing mission to provide you with exceptional heart care, we have created designated Provider Care Teams.  These Care Teams include your primary Cardiologist (physician) and Advanced Practice Providers (APPs -  Physician Assistants and Nurse Practitioners) who all work together to provide you with the care you need, when you need it. ? ?We recommend signing up for the patient portal called "MyChart".  Sign up information is provided on this After Visit Summary.  MyChart is used to connect with patients for Virtual Visits (Telemedicine).  Patients are able to view lab/test results, encounter notes, upcoming appointments, etc.  Non-urgent messages can be sent to your provider as well.   ?To learn more about what you can do with MyChart, go to [email or web address].   ? ?Your next appointment:   ?1 year(s) ? ?The format for your next appointment:   ?In Person ? ?Provider:   ?Mertie Moores, MD  or Christen Bame, NP      ? ? ?Important Information About Sugar ? ? ? ? ?  ?

## 2022-03-08 LAB — BASIC METABOLIC PANEL
BUN/Creatinine Ratio: 12 (ref 12–28)
BUN: 12 mg/dL (ref 8–27)
CO2: 26 mmol/L (ref 20–29)
Calcium: 10 mg/dL (ref 8.7–10.3)
Chloride: 101 mmol/L (ref 96–106)
Creatinine, Ser: 1.01 mg/dL — ABNORMAL HIGH (ref 0.57–1.00)
Glucose: 124 mg/dL — ABNORMAL HIGH (ref 70–99)
Potassium: 4.7 mmol/L (ref 3.5–5.2)
Sodium: 143 mmol/L (ref 134–144)
eGFR: 60 mL/min/{1.73_m2} (ref >59)

## 2022-03-08 LAB — LIPID PANEL
Chol/HDL Ratio: 2.5 ratio (ref 0.0–4.4)
Cholesterol, Total: 149 mg/dL (ref 100–199)
HDL: 60 mg/dL (ref >39)
LDL Chol Calc (NIH): 66 mg/dL (ref 0–99)
Triglycerides: 130 mg/dL (ref 0–149)
VLDL Cholesterol Cal: 23 mg/dL (ref 5–40)

## 2022-03-08 LAB — CBC
Hematocrit: 38.8 % (ref 34.0–46.6)
Hemoglobin: 12.8 g/dL (ref 11.1–15.9)
MCH: 28.3 pg (ref 26.6–33.0)
MCHC: 33 g/dL (ref 31.5–35.7)
MCV: 86 fL (ref 79–97)
Platelets: 189 10*3/uL (ref 150–450)
RBC: 4.52 x10E6/uL (ref 3.77–5.28)
RDW: 12.9 % (ref 11.7–15.4)
WBC: 5.6 10*3/uL (ref 3.4–10.8)

## 2022-03-08 LAB — ALT: ALT: 22 IU/L (ref 0–32)

## 2022-03-14 ENCOUNTER — Telehealth: Payer: Self-pay | Admitting: *Deleted

## 2022-03-14 NOTE — Telephone Encounter (Signed)
? ?  Pre-operative Risk Assessment  ?  ?Patient Name: [name]  ?DOB: [birth year] ?MRN: [id number]  ? ?  ? ?Request for Surgical Clearance   ? ?Procedure:   COLONOSCOPY ? ?Date of Surgery:  Clearance 06/13/22                              ?   ?Surgeon:  MD NOT LISTED ?Surgeon's Group or Practice Name:  [facility];GI DEPT ?Phone number:  [phone] ?Fax number:  [phone] ?  ?Type of Clearance Requested:   ?- Medical  ?- Pharmacy:  Hold Apixaban (Eliquis) PER ASGE GUIDELINES ?  ?Type of Anesthesia:   NOT LISTED; PROPOFOL?  ?  ?Additional requests/questions:   ? ?Signed, ?Danielle Rankin   ?03/14/2022, 3:37 PM  ? ?

## 2022-03-15 NOTE — Telephone Encounter (Signed)
? ?  Patient Name: [name]  ?DOB: [birth year] ?MRN: [id number] ? ?Primary Cardiologist: Mertie Moores, MD ? ?Chart reviewed as part of pre-operative protocol coverage. Last OV from 02/2022 reviewed. Reached out to patient to ensure no clinical changes from that visit. The patient affirms she has been doing well without any new cardiac symptoms. Therefore would be at acceptable risk for the planned procedure without further cardiovascular testing. ? ?Per office protocol, patient can hold Eliquis for 1-2 days prior to procedure.   ? ?The patient was advised that if she develops new symptoms prior to surgery to contact our office to arrange for a follow-up visit, and she verbalized understanding. ? ?Will route this bundled recommendation to requesting provider via Epic fax function. Please call with questions. ? ? ?Charlie Pitter, PA-C ?03/15/2022, 3:03 PM ? ? ?

## 2022-03-15 NOTE — Telephone Encounter (Signed)
Will route to pharm for anticoag. Last OV 02/2022. ?

## 2022-03-15 NOTE — Telephone Encounter (Signed)
Patient with diagnosis of afib on Eliquis for anticoagulation.   ? ?Procedure: colonoscopy ?Date of procedure: 06/13/22 ? ?CHA2DS2-VASc Score = 5  ?This indicates a 7.2% annual risk of stroke. ?The patient's score is based upon: ?CHF History: 1 ?HTN History: 1 ?Diabetes History: 0 ?Stroke History: 0 ?Vascular Disease History: 1 ?Age Score: 1 ?Gender Score: 1 ?  ?CrCl 47mL/min using adjusted body weight ?Platelet count 189K ? ?Per office protocol, patient can hold Eliquis for 1-2 days prior to procedure.   ?

## 2022-03-17 ENCOUNTER — Telehealth: Payer: Self-pay

## 2022-03-17 NOTE — Telephone Encounter (Signed)
? ?  Pre-operative Risk Assessment  ?  ?Patient Name: [name]  ?DOB: [birth year] ?MRN: [id number]  ? ?  ? ?Request for Surgical Clearance   ? ?Procedure:   Colonoscopy ? ?Date of Surgery:  Clearance 06/13/22                              ?   ?Surgeon:  Dr Eather Colas ?Surgeon's Group or Practice Name:  [facility] ?Phone number:  [phone] ?Fax number:  [phone] ?  ?Type of Clearance Requested:   ?- Pharmacy:  Hold Apixaban (Eliquis)   ?  ?Type of Anesthesia:   Propofol ?  ?Additional requests/questions:   n/a ? ?Signed, ?Kandice Robinsons T   ?03/17/2022, 2:38 PM  ?

## 2022-03-17 NOTE — Telephone Encounter (Signed)
Duplicate request. This was already addressed earlier this week on 03/15/2022 and was faxed back to requesting office. Will remove from pre-op pool. ? ?Corrin Parker, PA-C ?03/17/2022 2:48 PM ? ?

## 2022-04-18 ENCOUNTER — Ambulatory Visit (INDEPENDENT_AMBULATORY_CARE_PROVIDER_SITE_OTHER): Payer: Medicare PPO

## 2022-04-18 DIAGNOSIS — I428 Other cardiomyopathies: Secondary | ICD-10-CM | POA: Diagnosis not present

## 2022-04-18 LAB — CUP PACEART REMOTE DEVICE CHECK
Battery Remaining Longevity: 115 mo
Battery Voltage: 2.96 V
Brady Statistic RV Percent Paced: 0.63 %
Date Time Interrogation Session: 20230606042305
HighPow Impedance: 55 Ohm
HighPow Impedance: 65 Ohm
Implantable Lead Implant Date: 20110627
Implantable Lead Location: 753860
Implantable Lead Model: 6947
Implantable Pulse Generator Implant Date: 20200602
Lead Channel Impedance Value: 380 Ohm
Lead Channel Impedance Value: 437 Ohm
Lead Channel Pacing Threshold Amplitude: 0.5 V
Lead Channel Pacing Threshold Pulse Width: 0.4 ms
Lead Channel Sensing Intrinsic Amplitude: 9.5 mV
Lead Channel Sensing Intrinsic Amplitude: 9.5 mV
Lead Channel Setting Pacing Amplitude: 2.5 V
Lead Channel Setting Pacing Pulse Width: 0.4 ms
Lead Channel Setting Sensing Sensitivity: 0.3 mV

## 2022-05-02 NOTE — Progress Notes (Signed)
Remote ICD transmission.   

## 2022-06-13 ENCOUNTER — Encounter
Admission: RE | Disposition: A | Discharge status: Home / Self Care | Payer: Self-pay | Source: Ambulatory Visit | Attending: Gastroenterology

## 2022-06-13 ENCOUNTER — Encounter: Payer: Self-pay | Admitting: *Deleted

## 2022-06-13 ENCOUNTER — Ambulatory Visit: Payer: Medicare PPO | Admitting: General Practice

## 2022-06-13 ENCOUNTER — Ambulatory Visit
Admission: RE | Admit: 2022-06-13 | Discharge: 2022-06-13 | Disposition: A | Discharge status: Home / Self Care | Payer: Medicare PPO | Source: Ambulatory Visit | Attending: Gastroenterology | Admitting: Gastroenterology

## 2022-06-13 DIAGNOSIS — K6389 Other specified diseases of intestine: Secondary | ICD-10-CM | POA: Insufficient documentation

## 2022-06-13 DIAGNOSIS — I2510 Atherosclerotic heart disease of native coronary artery without angina pectoris: Secondary | ICD-10-CM | POA: Diagnosis not present

## 2022-06-13 DIAGNOSIS — K640 First degree hemorrhoids: Secondary | ICD-10-CM | POA: Insufficient documentation

## 2022-06-13 DIAGNOSIS — I5042 Chronic combined systolic (congestive) and diastolic (congestive) heart failure: Secondary | ICD-10-CM | POA: Diagnosis not present

## 2022-06-13 DIAGNOSIS — Z1211 Encounter for screening for malignant neoplasm of colon: Secondary | ICD-10-CM | POA: Insufficient documentation

## 2022-06-13 DIAGNOSIS — I252 Old myocardial infarction: Secondary | ICD-10-CM | POA: Diagnosis not present

## 2022-06-13 DIAGNOSIS — Z95810 Presence of automatic (implantable) cardiac defibrillator: Secondary | ICD-10-CM | POA: Insufficient documentation

## 2022-06-13 DIAGNOSIS — E785 Hyperlipidemia, unspecified: Secondary | ICD-10-CM | POA: Diagnosis not present

## 2022-06-13 DIAGNOSIS — I4891 Unspecified atrial fibrillation: Secondary | ICD-10-CM | POA: Insufficient documentation

## 2022-06-13 DIAGNOSIS — Z955 Presence of coronary angioplasty implant and graft: Secondary | ICD-10-CM | POA: Insufficient documentation

## 2022-06-13 DIAGNOSIS — K5730 Diverticulosis of large intestine without perforation or abscess without bleeding: Secondary | ICD-10-CM | POA: Diagnosis not present

## 2022-06-13 DIAGNOSIS — I110 Hypertensive heart disease with heart failure: Secondary | ICD-10-CM | POA: Diagnosis not present

## 2022-06-13 DIAGNOSIS — Z87891 Personal history of nicotine dependence: Secondary | ICD-10-CM | POA: Diagnosis not present

## 2022-06-13 HISTORY — PX: COLONOSCOPY WITH PROPOFOL: SHX5780

## 2022-06-13 SURGERY — COLONOSCOPY WITH PROPOFOL
Anesthesia: General

## 2022-06-13 MED ORDER — LIDOCAINE HCL (CARDIAC) PF 100 MG/5ML IV SOSY
PREFILLED_SYRINGE | INTRAVENOUS | Status: DC | PRN
  Administered 2022-06-13: 50 mg via INTRAVENOUS

## 2022-06-13 MED ORDER — PROPOFOL 500 MG/50ML IV EMUL
INTRAVENOUS | Status: DC | PRN
  Administered 2022-06-13: 100 ug/kg/min via INTRAVENOUS

## 2022-06-13 MED ORDER — SODIUM CHLORIDE 0.9 % IV SOLN
INTRAVENOUS | Status: DC
  Administered 2022-06-13: 1000 mL via INTRAVENOUS

## 2022-06-13 MED ORDER — PROPOFOL 10 MG/ML IV BOLUS
INTRAVENOUS | Status: DC | PRN
  Administered 2022-06-13: 30 mg via INTRAVENOUS
  Administered 2022-06-13: 70 mg via INTRAVENOUS

## 2022-06-13 NOTE — Anesthesia Preprocedure Evaluation (Signed)
Anesthesia Evaluation  Patient identified by MRN, date of birth, ID band Patient awake    Reviewed: Allergy & Precautions, H&P , NPO status , Patient's Chart, lab work & pertinent test results  History of Anesthesia Complications Negative for: history of anesthetic complications  Airway Mallampati: III  TM Distance: >3 FB Neck ROM: full    Dental no notable dental hx. (+) Chipped   Pulmonary neg pulmonary ROS, former smoker,    Pulmonary exam normal breath sounds clear to auscultation       Cardiovascular Exercise Tolerance: Good hypertension, + CAD, + Past MI, + Cardiac Stents (on Eliquis) and +CHF  negative cardio ROS Normal cardiovascular exam+ dysrhythmias Atrial Fibrillation + Cardiac Defibrillator  Rhythm:Regular Rate:Normal  EF 30-35% with apical aneurysm Transient A fib/flutter, NSR in preop today   Neuro/Psych negative neurological ROS  negative psych ROS   GI/Hepatic negative GI ROS, Neg liver ROS,   Endo/Other  negative endocrine ROS  Renal/GU negative Renal ROS  negative genitourinary   Musculoskeletal   Abdominal   Peds  Hematology negative hematology ROS (+)   Anesthesia Other Findings Past Medical History: 05/09/2010: AICD (automatic cardioverter/defibrillator) present     Comment:  Medtronic Virtuoso No date: CHF (congestive heart failure) (HCC)     Comment:  EF 30-35% BY ECHO. SHE HAS AN APICAL ANEURYSM 12/12/2008: Coronary artery disease     Comment:  STATUS POST ANTERIOR WALL MYOCARDIAL INFARCTION. STATUS               POST PTCA AND STENTING OF HER LAD No date: Dyslipidemia No date: Hypertension 12/12/2008: Myocardial infarction [facility]) No date: Presence of permanent cardiac pacemaker No date: Transient atrial fibrillation or flutter  Past Surgical History: 12/12/2008: CARDIAC CATHETERIZATION     Comment:  stent 05/09/2010: CARDIAC DEFIBRILLATOR PLACEMENT     Comment:  Medtronic  Virtuoso 05/26/2020: CATARACT EXTRACTION W/PHACO; Right     Comment:  Procedure: CATARACT EXTRACTION PHACO AND INTRAOCULAR               LENS PLACEMENT (IOC) RIGHT;  Surgeon: Lockie Mola, MD;  Location: [facility];  Service:               Ophthalmology;  Laterality: Right;  13.77 1:26.4 15.9% 08/18/2020: CATARACT EXTRACTION W/PHACO; Left     Comment:  Procedure: CATARACT EXTRACTION PHACO AND INTRAOCULAR               LENS PLACEMENT (IOC) LEFT;  Surgeon: Lockie Mola, MD;  Location: [facility];  Service:               Ophthalmology;  Laterality: Left;  4.87 0:57.0 8.6% No date: COLONOSCOPY 04/15/2019: ICD GENERATOR CHANGEOUT; N/A     Comment:  Procedure: ICD GENERATOR CHANGEOUT;  Surgeon: Marinus Maw, MD;  Location: [facility] INVASIVE CV LAB;  Service:               Cardiovascular;  Laterality: N/A;  BMI    Body Mass Index: 27.95 kg/m      Reproductive/Obstetrics negative OB ROS                             Anesthesia Physical  Anesthesia  Plan  ASA: 3  Anesthesia Plan: General   Post-op Pain Management: Minimal or no pain anticipated   Induction: Intravenous  PONV Risk Score and Plan: 2 and Propofol infusion and TIVA  Airway Management Planned: Nasal Cannula and Natural Airway  Additional Equipment: None  Intra-op Plan:   Post-operative Plan:   Informed Consent: I have reviewed the patients History and Physical, chart, labs and discussed the procedure including the risks, benefits and alternatives for the proposed anesthesia with the patient or authorized representative who has indicated his/her understanding and acceptance.     Dental advisory given  Plan Discussed with: CRNA  Anesthesia Plan Comments: (Discussed risks of anesthesia with patient, including possibility of difficulty with spontaneous ventilation under anesthesia necessitating airway intervention, PONV,  and rare risks such as cardiac or respiratory or neurological events, and allergic reactions. Discussed the role of CRNA in patient's perioperative care. Patient understands.)        Anesthesia Quick Evaluation

## 2022-06-13 NOTE — Transfer of Care (Signed)
Immediate Anesthesia Transfer of Care Note  Patient: [name]  Procedure(s) Performed: COLONOSCOPY WITH PROPOFOL  Patient Location: PACU  Anesthesia Type:MAC  Level of Consciousness: awake, alert  and oriented  Airway & Oxygen Therapy: Patient Spontanous Breathing  Post-op Assessment: Report given to RN and Post -op Vital signs reviewed and stable  Post vital signs: stable  Last Vitals:  Vitals Value Taken Time  BP 100/50 06/13/22 1338  Temp    Pulse 67 06/13/22 1338  Resp 18 06/13/22 1338  SpO2 99 % 06/13/22 1338    Last Pain:  Vitals:   06/13/22 1338  TempSrc:   PainSc: 0-No pain         Complications: No notable events documented.

## 2022-06-13 NOTE — Interval H&P Note (Signed)
History and Physical Interval Note:  06/13/2022 1:08 PM  [name]  has presented today for surgery, with the diagnosis of Colon Cancer Screening.  The various methods of treatment have been discussed with the patient and family. After consideration of risks, benefits and other options for treatment, the patient has consented to  Procedure(s) with comments: COLONOSCOPY WITH PROPOFOL (N/A) - Please, call her cell # [phone] for her time. as a surgical intervention.  The patient's history has been reviewed, patient examined, no change in status, stable for surgery.  I have reviewed the patient's chart and labs.  Questions were answered to the patient's satisfaction.     Regis Bill  Ok to proceed with colonoscopy

## 2022-06-13 NOTE — Op Note (Signed)
[facility] Patient Name: [name] Procedure Date: 06/13/2022 1:05 PM MRN: [id number] Account #: [id number] Date of Birth: 26-Oct-1951 Admit Type: Outpatient Age: 71 Room: [facility] ENDO ROOM 1 Gender: Female Note Status: Finalized Instrument Name: Jasper Riling [id number] Procedure:             Colonoscopy Indications:           Screening for colorectal malignant neoplasm Providers:             Andrey Farmer MD, MD Referring MD:          Baxter Hire, MD (Referring MD) Medicines:             Monitored Anesthesia Care Complications:         No immediate complications. Estimated blood loss:                         Minimal. Procedure:             Pre-Anesthesia Assessment:                        - Prior to the procedure, a History and Physical was                         performed, and patient medications and allergies were                         reviewed. The patient is competent. The risks and                         benefits of the procedure and the sedation options and                         risks were discussed with the patient. All questions                         were answered and informed consent was obtained.                         Patient identification and proposed procedure were                         verified by the physician, the nurse, the                         anesthesiologist, the anesthetist and the technician                         in the endoscopy suite. Mental Status Examination:                         alert and oriented. Airway Examination: normal                         oropharyngeal airway and neck mobility. Respiratory                         Examination: clear to auscultation. CV Examination:  normal. Prophylactic Antibiotics: The patient does not                         require prophylactic antibiotics. Prior                         Anticoagulants: The patient has taken Eliquis                          (apixaban), last dose was 2 days prior to procedure.                         ASA Grade Assessment: III - A patient with severe                         systemic disease. After reviewing the risks and                         benefits, the patient was deemed in satisfactory                         condition to undergo the procedure. The anesthesia                         plan was to use monitored anesthesia care (MAC).                         Immediately prior to administration of medications,                         the patient was re-assessed for adequacy to receive                         sedatives. The heart rate, respiratory rate, oxygen                         saturations, blood pressure, adequacy of pulmonary                         ventilation, and response to care were monitored                         throughout the procedure. The physical status of the                         patient was re-assessed after the procedure.                        After obtaining informed consent, the colonoscope was                         passed under direct vision. Throughout the procedure,                         the patient's blood pressure, pulse, and oxygen                         saturations were monitored continuously. The  Colonoscope was introduced through the anus and                         advanced to the the cecum, identified by appendiceal                         orifice and ileocecal valve. The colonoscopy was                         performed without difficulty. The patient tolerated                         the procedure well. The quality of the bowel                         preparation was good. Findings:      The perianal and digital rectal examinations were normal.      A 2 mm polyp was found in the sigmoid colon. The polyp was sessile. The       polyp was removed with a jumbo cold forceps. Resection and retrieval       were complete. Estimated  blood loss was minimal.      Scattered small and large-mouthed diverticula were found in the sigmoid       colon, descending colon and ascending colon.      Internal hemorrhoids were found during retroflexion. The hemorrhoids       were Grade I (internal hemorrhoids that do not prolapse).      The exam was otherwise without abnormality on direct and retroflexion       views. Impression:            - One 2 mm polyp in the sigmoid colon, removed with a                         jumbo cold forceps. Resected and retrieved.                        - Diverticulosis in the sigmoid colon, in the                         descending colon and in the ascending colon.                        - Internal hemorrhoids.                        - The examination was otherwise normal on direct and                         retroflexion views. Recommendation:        - Discharge patient to home.                        - Resume previous diet.                        - Continue present medications.                        - Await pathology results.                        -  Repeat colonoscopy for surveillance based on                         pathology results.                        - Return to referring physician as previously                         scheduled. Procedure Code(s):     --- Professional ---                        [phone], Colonoscopy, flexible; with biopsy, single or                         multiple Diagnosis Code(s):     --- Professional ---                        Z12.11, Encounter for screening for malignant neoplasm                         of colon                        K63.5, Polyp of colon                        K64.0, First degree hemorrhoids                        K57.30, Diverticulosis of large intestine without                         perforation or abscess without bleeding CPT copyright 2019 American Medical Association. All rights reserved. The codes documented in this report are preliminary  and upon coder review may  be revised to meet current compliance requirements. Andrey Farmer MD, MD 06/13/2022 1:36:30 PM Number of Addenda: 0 Note Initiated On: 06/13/2022 1:05 PM Scope Withdrawal Time: 0 hours 9 minutes 57 seconds  Total Procedure Duration: 0 hours 16 minutes 9 seconds  Estimated Blood Loss:  Estimated blood loss was minimal.      [facility]

## 2022-06-13 NOTE — Anesthesia Postprocedure Evaluation (Signed)
Anesthesia Post Note  Patient: [name]  Procedure(s) Performed: COLONOSCOPY WITH PROPOFOL  Patient location during evaluation: Endoscopy Anesthesia Type: General Level of consciousness: awake and alert Pain management: pain level controlled Vital Signs Assessment: post-procedure vital signs reviewed and stable Respiratory status: spontaneous breathing, nonlabored ventilation, respiratory function stable and patient connected to nasal cannula oxygen Cardiovascular status: blood pressure returned to baseline and stable Postop Assessment: no apparent nausea or vomiting Anesthetic complications: no   No notable events documented.   Last Vitals:  Vitals:   06/13/22 1238 06/13/22 1338  BP: (!) 156/80 (!) 100/50  Pulse: 73 67  Resp: 18 18  Temp: (!) 35.9 C   SpO2: 100% 99%    Last Pain:  Vitals:   06/13/22 1338  TempSrc:   PainSc: 0-No pain                 Stephanie Coup

## 2022-06-13 NOTE — H&P (Signed)
Outpatient short stay form Pre-procedure 06/13/2022  Regis Bill, MD  Primary Physician: Gracelyn Nurse, MD  Reason for visit:  Screening  History of present illness:    71 y/o lady with history of hypertension, CAD, a. Fib on eliquis with  last dose 2 days ago, and history of both systolic and diastolic heart failure here for index screening colonoscopy. No family history of GI malignancies. No significant abdominal surgeries.    Current Facility-Administered Medications:    0.9 %  sodium chloride infusion, , Intravenous, Continuous, Hellon Vaccarella, Rossie Muskrat, MD, Last Rate: 20 mL/hr at 06/13/22 1305, Continued from Pre-op at 06/13/22 1305  Medications Prior to Admission  Medication Sig Dispense Refill Last Dose   amiodarone (PACERONE) 200 MG tablet TAKE 1 TABLET BY MOUTH TWICE A WEEK 24 tablet 2 06/12/2022   atorvastatin (LIPITOR) 80 MG tablet Take 1 tablet (80 mg total) by mouth daily. 90 tablet 3 06/12/2022   Calcium Carbonate-Vitamin D (CALCIUM 600+D PO) Take by mouth daily.   06/12/2022   carvedilol (COREG) 25 MG tablet TAKE 1 TABLET BY MOUTH TWICE DAILY WITH MEALS 180 tablet 3 06/12/2022   furosemide (LASIX) 40 MG tablet Take 1 tablet by mouth once daily 90 tablet 1 06/12/2022   lisinopril (ZESTRIL) 20 MG tablet Take 1 tablet by mouth once daily 90 tablet 3 06/12/2022   Multiple Vitamin (MULTIVITAMIN WITH MINERALS) TABS tablet Take 1 tablet by mouth daily.   06/12/2022   potassium chloride (KLOR-CON) 10 MEQ tablet Take 1 tablet by mouth daily 90 tablet 02 06/12/2022   spironolactone (ALDACTONE) 25 MG tablet Take 1 tablet by mouth once daily 90 tablet 3 06/12/2022   apixaban (ELIQUIS) 5 MG TABS tablet Take 1 tablet by mouth twice daily 180 tablet 1 06/10/2022     No Known Allergies   Past Medical History:  Diagnosis Date   AICD (automatic cardioverter/defibrillator) present 05/09/2010   Medtronic Virtuoso   CHF (congestive heart failure) (HCC)    EF 30-35% BY ECHO. SHE HAS AN  APICAL ANEURYSM   Coronary artery disease 12/12/2008   STATUS POST ANTERIOR WALL MYOCARDIAL INFARCTION. STATUS POST PTCA AND STENTING OF HER LAD   Dyslipidemia    Hypertension    Myocardial infarction (HCC) 12/12/2008   Presence of permanent cardiac pacemaker    Transient atrial fibrillation or flutter     Review of systems:  Otherwise negative.    Physical Exam  Gen: Alert, oriented. Appears stated age.  HEENT: PERRLA. Lungs: No respiratory distress CV: RRR Abd: soft, benign, no masses Ext: No edema    Planned procedures: Proceed with colonoscopy. The patient understands the nature of the planned procedure, indications, risks, alternatives and potential complications including but not limited to bleeding, infection, perforation, damage to internal organs and possible oversedation/side effects from anesthesia. The patient agrees and gives consent to proceed.  Please refer to procedure notes for findings, recommendations and patient disposition/instructions.     Regis Bill, MD [facility]

## 2022-06-14 ENCOUNTER — Encounter: Payer: Self-pay | Admitting: Gastroenterology

## 2022-06-15 LAB — SURGICAL PATHOLOGY

## 2022-07-18 ENCOUNTER — Other Ambulatory Visit: Payer: Self-pay | Admitting: Internal Medicine

## 2022-07-18 ENCOUNTER — Ambulatory Visit (INDEPENDENT_AMBULATORY_CARE_PROVIDER_SITE_OTHER): Payer: Medicare PPO

## 2022-07-18 DIAGNOSIS — I428 Other cardiomyopathies: Secondary | ICD-10-CM

## 2022-07-18 NOTE — Telephone Encounter (Signed)
Prescription refill request for Eliquis received. Indication:Afib Last office visit:4/23 Scr:1.0 Age: 71 Weight:90.9 kg  Prescription refilled

## 2022-07-21 LAB — CUP PACEART REMOTE DEVICE CHECK
Battery Remaining Longevity: 112 mo
Battery Voltage: 2.96 V
Brady Statistic RV Percent Paced: 0.56 %
Date Time Interrogation Session: 20230905012504
HighPow Impedance: 57 Ohm
HighPow Impedance: 77 Ohm
Implantable Lead Implant Date: 20110627
Implantable Lead Location: 753860
Implantable Lead Model: 6947
Implantable Pulse Generator Implant Date: 20200602
Lead Channel Impedance Value: 399 Ohm
Lead Channel Impedance Value: 494 Ohm
Lead Channel Pacing Threshold Amplitude: 0.625 V
Lead Channel Pacing Threshold Pulse Width: 0.4 ms
Lead Channel Sensing Intrinsic Amplitude: 8 mV
Lead Channel Sensing Intrinsic Amplitude: 8 mV
Lead Channel Setting Pacing Amplitude: 2.5 V
Lead Channel Setting Pacing Pulse Width: 0.4 ms
Lead Channel Setting Sensing Sensitivity: 0.3 mV

## 2022-08-07 ENCOUNTER — Other Ambulatory Visit: Payer: Self-pay | Admitting: Internal Medicine

## 2022-08-07 ENCOUNTER — Other Ambulatory Visit: Payer: Self-pay | Admitting: Cardiovascular Disease

## 2022-08-09 NOTE — Progress Notes (Signed)
Remote ICD transmission.   

## 2022-08-21 ENCOUNTER — Other Ambulatory Visit: Payer: Self-pay | Admitting: Internal Medicine

## 2022-08-24 NOTE — Progress Notes (Signed)
Electrophysiology Office Note Date: 08/25/2022  ID:  [name], DOB [birth year], MRN [id number]  PCP: Baxter Hire, MD Primary Cardiologist: Mertie Moores, MD Electrophysiologist: Cristopher Peru, MD   CC: Routine ICD follow-up  [name] is a 71 y.o. female seen today for Cristopher Peru, MD for routine electrophysiology followup. Since last being seen in our clinic the patient reports doing very well.  she denies chest pain, palpitations, dyspnea, PND, orthopnea, nausea, vomiting, dizziness, syncope, edema, weight gain, or early satiety.   He has not had ICD shocks.   Device History: Medtronic Single Chamber ICD implanted 2011, gen change 04/2019 for CHF  Past Medical History:  Diagnosis Date   AICD (automatic cardioverter/defibrillator) present 05/09/2010   Medtronic Virtuoso   CHF (congestive heart failure) (HCC)    EF 30-35% BY ECHO. SHE HAS AN APICAL ANEURYSM   Coronary artery disease 12/12/2008   STATUS POST ANTERIOR WALL MYOCARDIAL INFARCTION. STATUS POST PTCA AND STENTING OF HER LAD   Dyslipidemia    Hypertension    Myocardial infarction ([place]) 12/12/2008   Presence of permanent cardiac pacemaker    Transient atrial fibrillation or flutter    Past Surgical History:  Procedure Laterality Date   CARDIAC CATHETERIZATION  12/12/2008   stent   CARDIAC DEFIBRILLATOR PLACEMENT  05/09/2010   Medtronic Virtuoso   CATARACT EXTRACTION W/PHACO Right 05/26/2020   Procedure: CATARACT EXTRACTION PHACO AND INTRAOCULAR LENS PLACEMENT ([place]) RIGHT;  Surgeon: Leandrew Koyanagi, MD;  Location: [place];  Service: Ophthalmology;  Laterality: Right;  13.77 1:26.4 15.9%   CATARACT EXTRACTION W/PHACO Left 08/18/2020   Procedure: CATARACT EXTRACTION PHACO AND INTRAOCULAR LENS PLACEMENT ([place]) LEFT;  Surgeon: Leandrew Koyanagi, MD;  Location: [place];  Service: Ophthalmology;  Laterality: Left;  4.87 0:57.0 8.6%   COLONOSCOPY     COLONOSCOPY WITH  PROPOFOL N/A 06/13/2022   Procedure: COLONOSCOPY WITH PROPOFOL;  Surgeon: Lesly Rubenstein, MD;  Location: [facility] ENDOSCOPY;  Service: Endoscopy;  Laterality: N/A;  Please, call her cell # [phone] for her time.   ICD GENERATOR CHANGEOUT N/A 04/15/2019   Procedure: ICD GENERATOR CHANGEOUT;  Surgeon: Evans Lance, MD;  Location: [place] [facility];  Service: Cardiovascular;  Laterality: N/A;    Current Outpatient Medications  Medication Sig Dispense Refill   amiodarone (PACERONE) 200 MG tablet TAKE 1 TABLET BY MOUTH TWICE A WEEK 24 tablet 0   apixaban (ELIQUIS) 5 MG TABS tablet Take 1 tablet by mouth twice daily 180 tablet 1   atorvastatin (LIPITOR) 80 MG tablet Take 1 tablet (80 mg total) by mouth daily. 90 tablet 3   Calcium Carbonate-Vitamin D (CALCIUM 600+D PO) Take by mouth daily.     carvedilol (COREG) 25 MG tablet TAKE 1 TABLET BY MOUTH TWICE DAILY WITH MEALS 180 tablet 3   furosemide (LASIX) 40 MG tablet Take 1 tablet by mouth once daily 90 tablet 2   lisinopril (ZESTRIL) 20 MG tablet Take 1 tablet by mouth once daily 30 tablet 0   Multiple Vitamin (MULTIVITAMIN WITH MINERALS) TABS tablet Take 1 tablet by mouth daily.     potassium chloride (KLOR-CON) 10 MEQ tablet Take 1 tablet by mouth daily 90 tablet 02   spironolactone (ALDACTONE) 25 MG tablet Take 1 tablet by mouth once daily 90 tablet 3   No current facility-administered medications for this visit.    Allergies:   Patient has no known allergies.   Social History: Social History   Socioeconomic History  Marital status: Widowed    Spouse name: Not on file   Number of children: Not on file   Years of education: Not on file   Highest education level: Not on file  Occupational History   Not on file  Tobacco Use   Smoking status: Former    Types: Cigarettes    Quit date: 12/14/2008    Years since quitting: 13.7   Smokeless tobacco: Never  Vaping Use   Vaping Use: Never used  Substance and Sexual Activity    Alcohol use: No   Drug use: No   Sexual activity: Not on file  Other Topics Concern   Not on file  Social History Narrative   Not on file   Social Determinants of Health   Financial Resource Strain: Not on file  Food Insecurity: Not on file  Transportation Needs: Not on file  Physical Activity: Not on file  Stress: Not on file  Social Connections: Not on file  Intimate Partner Violence: Not on file    Family History: Family History  Problem Relation Age of Onset   Hypertension Mother    Heart disease Sister    Heart disease Brother    Breast cancer Neg Hx     Review of Systems: All other systems reviewed and are otherwise negative except as noted above.  Physical Exam: Vitals:   08/25/22 0801  BP: 122/80  Pulse: 78  Weight: 214 lb (97.1 kg)  Height: 5\' 11"  (1.803 m)     GEN- The patient is well appearing, alert and oriented x 3 today.   HEENT: normocephalic, atraumatic; sclera clear, conjunctiva pink; hearing intact; oropharynx clear; neck supple, no JVP Lymph- no cervical lymphadenopathy Lungs- Clear to ausculation bilaterally, normal work of breathing.  No wheezes, rales, rhonchi Heart- Regular rate and rhythm, no murmurs, rubs or gallops, PMI not laterally displaced GI- soft, non-tender, non-distended, bowel sounds present, no hepatosplenomegaly Extremities- no clubbing or cyanosis. No peripheral edema; DP/PT/radial pulses 2+ bilaterally MS- no significant deformity or atrophy Skin- warm and dry, no rash or lesion; ICD pocket well healed Psych- euthymic mood, full affect Neuro- strength and sensation are intact  ICD interrogation- reviewed in detail today,  See PACEART report  EKG:  EKG is ordered today. Personal review of EKG ordered today shows NSR 78 bpm  Recent Labs: 03/03/2022: ALT 22; BUN 12; Creatinine, Ser 1.01; Hemoglobin 12.8; Platelets 189; Potassium 4.7; Sodium 143   Wt Readings from Last 3 Encounters:  08/25/22 214 lb (97.1 kg)  06/13/22  200 lb 6.4 oz (90.9 kg)  03/03/22 217 lb 12.8 oz (98.8 kg)     Other studies Reviewed: Additional studies/ records that were reviewed today include: Previous EP office notes.   Assessment and Plan:  1.  Chronic systolic dysfunction s/p Medtronic dual chamber ICD  euvolemic today Stable on an appropriate medical regimen Normal ICD function See Pace Art report Made AF detection less sensitive with some NSR ectopy episodes.   2. Paroxysmal atrial fibrillation EKG today shows NSR Continue amiodarone 200 mg twice weekly. Labs today Continue eliquis 5 mg BID [place] ~6.6% but as above some NSR with ectopy.   Current medicines are reviewed at length with the patient today.     Labs/ tests ordered today include:  Orders Placed This Encounter  Procedures   Comprehensive metabolic panel   TSH   T4, free   EKG 12-Lead    Disposition:   Follow up with Dr. 03/05/22 in 12 months  Dustin Flock, PA-C  08/25/2022 8:16 AM  [facility] [address] [place] [address] [phone] (office) [phone] (fax)

## 2022-08-25 ENCOUNTER — Encounter: Payer: Self-pay | Admitting: Student

## 2022-08-25 ENCOUNTER — Ambulatory Visit: Payer: Medicare PPO | Attending: Student | Admitting: Student

## 2022-08-25 VITALS — BP 122/80 | HR 78 | Ht 71.0 in | Wt 214.0 lb

## 2022-08-25 DIAGNOSIS — I428 Other cardiomyopathies: Secondary | ICD-10-CM

## 2022-08-25 DIAGNOSIS — I2583 Coronary atherosclerosis due to lipid rich plaque: Secondary | ICD-10-CM | POA: Diagnosis not present

## 2022-08-25 DIAGNOSIS — I2510 Atherosclerotic heart disease of native coronary artery without angina pectoris: Secondary | ICD-10-CM | POA: Diagnosis not present

## 2022-08-25 LAB — COMPREHENSIVE METABOLIC PANEL
ALT: 24 IU/L (ref 0–32)
AST: 20 IU/L (ref 0–40)
Albumin/Globulin Ratio: 1.7 (ref 1.2–2.2)
Albumin: 4.7 g/dL (ref 3.9–4.9)
Alkaline Phosphatase: 119 IU/L (ref 44–121)
BUN/Creatinine Ratio: 18 (ref 12–28)
BUN: 19 mg/dL (ref 8–27)
Bilirubin Total: 0.5 mg/dL (ref 0.0–1.2)
CO2: 25 mmol/L (ref 20–29)
Calcium: 9.9 mg/dL (ref 8.7–10.3)
Chloride: 102 mmol/L (ref 96–106)
Creatinine, Ser: 1.06 mg/dL — ABNORMAL HIGH (ref 0.57–1.00)
Globulin, Total: 2.7 g/dL (ref 1.5–4.5)
Glucose: 117 mg/dL — ABNORMAL HIGH (ref 70–99)
Potassium: 4.4 mmol/L (ref 3.5–5.2)
Sodium: 140 mmol/L (ref 134–144)
Total Protein: 7.4 g/dL (ref 6.0–8.5)
eGFR: 57 mL/min/{1.73_m2} — ABNORMAL LOW (ref >59)

## 2022-08-25 LAB — T4, FREE: Free T4: 1.24 ng/dL (ref 0.82–1.77)

## 2022-08-25 LAB — CUP PACEART INCLINIC DEVICE CHECK
Battery Remaining Longevity: 109 mo
Battery Voltage: 3.01 V
Brady Statistic RV Percent Paced: 0.34 %
Date Time Interrogation Session: 20231013081410
HighPow Impedance: 56 Ohm
HighPow Impedance: 77 Ohm
Implantable Lead Implant Date: 20110627
Implantable Lead Location: 753860
Implantable Lead Model: 6947
Implantable Pulse Generator Implant Date: 20200602
Lead Channel Impedance Value: 380 Ohm
Lead Channel Impedance Value: 513 Ohm
Lead Channel Pacing Threshold Amplitude: 0.625 V
Lead Channel Pacing Threshold Pulse Width: 0.4 ms
Lead Channel Sensing Intrinsic Amplitude: 12 mV
Lead Channel Sensing Intrinsic Amplitude: 14.5 mV
Lead Channel Setting Pacing Amplitude: 2.5 V
Lead Channel Setting Pacing Pulse Width: 0.4 ms
Lead Channel Setting Sensing Sensitivity: 0.3 mV

## 2022-08-25 LAB — TSH: TSH: 4.24 u[IU]/mL (ref 0.450–4.500)

## 2022-08-25 NOTE — Patient Instructions (Signed)
Medication Instructions:  Your physician recommends that you continue on your current medications as directed. Please refer to the Current Medication list given to you today.  *If you need a refill on your cardiac medications before your next appointment, please call your pharmacy*   Lab Work: TODAY: CMET, TSH, FreeT4  If you have labs (blood work) drawn today and your tests are completely normal, you will receive your results only by: [place] (if you have MyChart) OR A paper copy in the mail If you have any lab test that is abnormal or we need to change your treatment, we will call you to review the results.   Follow-Up: At [facility], you and your health needs are our priority.  As part of our continuing mission to provide you with exceptional heart care, we have created designated Provider Care Teams.  These Care Teams include your primary Cardiologist (physician) and Advanced Practice Providers (APPs -  Physician Assistants and Nurse Practitioners) who all work together to provide you with the care you need, when you need it.  We recommend signing up for the patient portal called "MyChart".  Sign up information is provided on this After Visit Summary.  MyChart is used to connect with patients for Virtual Visits (Telemedicine).  Patients are able to view lab/test results, encounter notes, upcoming appointments, etc.  Non-urgent messages can be sent to your provider as well.   To learn more about what you can do with MyChart, go to [email or web address].    Your next appointment:   1 year(s)  The format for your next appointment:   In Person  Provider:   Cristopher Peru, MD    Important Information About Sugar

## 2022-09-12 ENCOUNTER — Other Ambulatory Visit: Payer: Self-pay | Admitting: Cardiovascular Disease

## 2022-09-25 ENCOUNTER — Other Ambulatory Visit: Payer: Self-pay | Admitting: Internal Medicine

## 2022-10-09 ENCOUNTER — Other Ambulatory Visit: Payer: Self-pay | Admitting: Internal Medicine

## 2022-10-09 DIAGNOSIS — Z1231 Encounter for screening mammogram for malignant neoplasm of breast: Secondary | ICD-10-CM

## 2022-10-17 ENCOUNTER — Ambulatory Visit (INDEPENDENT_AMBULATORY_CARE_PROVIDER_SITE_OTHER): Payer: Medicare PPO

## 2022-10-17 DIAGNOSIS — I428 Other cardiomyopathies: Secondary | ICD-10-CM

## 2022-10-17 LAB — CUP PACEART REMOTE DEVICE CHECK
Battery Remaining Longevity: 108 mo
Battery Voltage: 2.94 V
Brady Statistic RV Percent Paced: 0.01 %
Date Time Interrogation Session: 20231205044223
HighPow Impedance: 54 Ohm
HighPow Impedance: 67 Ohm
Implantable Lead Connection Status: 753985
Implantable Lead Implant Date: 20110627
Implantable Lead Location: 753860
Implantable Lead Model: 6947
Implantable Pulse Generator Implant Date: 20200602
Lead Channel Impedance Value: 323 Ohm
Lead Channel Impedance Value: 437 Ohm
Lead Channel Pacing Threshold Amplitude: 1 V
Lead Channel Pacing Threshold Pulse Width: 0.4 ms
Lead Channel Sensing Intrinsic Amplitude: 9.375 mV
Lead Channel Sensing Intrinsic Amplitude: 9.375 mV
Lead Channel Setting Pacing Amplitude: 2.5 V
Lead Channel Setting Pacing Pulse Width: 0.4 ms
Lead Channel Setting Sensing Sensitivity: 0.3 mV
Zone Setting Status: 755011
Zone Setting Status: 755011

## 2022-10-24 ENCOUNTER — Other Ambulatory Visit: Payer: Self-pay | Admitting: Cardiovascular Disease

## 2022-11-14 NOTE — Progress Notes (Signed)
Remote ICD transmission.   

## 2022-11-29 ENCOUNTER — Ambulatory Visit
Admission: RE | Admit: 2022-11-29 | Discharge: 2022-11-29 | Disposition: A | Discharge status: Home / Self Care | Payer: Medicare PPO | Source: Ambulatory Visit | Attending: Internal Medicine | Admitting: Internal Medicine

## 2022-11-29 DIAGNOSIS — Z1231 Encounter for screening mammogram for malignant neoplasm of breast: Secondary | ICD-10-CM | POA: Insufficient documentation

## 2023-01-08 ENCOUNTER — Other Ambulatory Visit: Payer: Self-pay | Admitting: Internal Medicine

## 2023-01-08 DIAGNOSIS — I480 Paroxysmal atrial fibrillation: Secondary | ICD-10-CM

## 2023-01-08 NOTE — Telephone Encounter (Signed)
Prescription refill request for Eliquis received. Indication: Afib  Last office visit: 08/25/22 ([place])  Scr: 1.06 (08/25/22)  Age: 72 Weight: 97.1kg  Appropriate dose. Refill sent.

## 2023-01-16 ENCOUNTER — Ambulatory Visit (INDEPENDENT_AMBULATORY_CARE_PROVIDER_SITE_OTHER): Payer: Medicare PPO

## 2023-01-16 ENCOUNTER — Other Ambulatory Visit: Payer: Self-pay | Admitting: Cardiology

## 2023-01-16 DIAGNOSIS — I428 Other cardiomyopathies: Secondary | ICD-10-CM

## 2023-01-17 LAB — CUP PACEART REMOTE DEVICE CHECK
Battery Remaining Longevity: 105 mo
Battery Voltage: 2.93 V
Brady Statistic RV Percent Paced: 0.02 %
Date Time Interrogation Session: 20240305043624
HighPow Impedance: 54 Ohm
HighPow Impedance: 70 Ohm
Implantable Lead Connection Status: 753985
Implantable Lead Implant Date: 20110627
Implantable Lead Location: 753860
Implantable Lead Model: 6947
Implantable Pulse Generator Implant Date: 20200602
Lead Channel Impedance Value: 342 Ohm
Lead Channel Impedance Value: 456 Ohm
Lead Channel Pacing Threshold Amplitude: 1 V
Lead Channel Pacing Threshold Pulse Width: 0.4 ms
Lead Channel Sensing Intrinsic Amplitude: 8.375 mV
Lead Channel Sensing Intrinsic Amplitude: 8.375 mV
Lead Channel Setting Pacing Amplitude: 2.5 V
Lead Channel Setting Pacing Pulse Width: 0.4 ms
Lead Channel Setting Sensing Sensitivity: 0.3 mV
Zone Setting Status: 755011
Zone Setting Status: 755011

## 2023-02-21 NOTE — Progress Notes (Signed)
Remote ICD transmission.   

## 2023-03-11 NOTE — Progress Notes (Unsigned)
Office Visit    Patient Name: [name] Date of Encounter: 03/12/2023  PCP:  Gracelyn Nurse, MD   [facility]  Cardiologist:  Kristeen Miss, MD  Advanced Practice Provider:  No care team member to display Electrophysiologist:  [name], MD   HPI    [name] is a 72 y.o. female with a past medical history of coronary artery disease status post anterior wall myocardial infarction with LAD stenting January 2010, chronic combined systolic and diastolic CHF, status post AICD placement, paroxysmal atrial fibrillation, dyslipidemia presents today for follow-up appointment.   The patient has a history of CAD status post anterior wall myocardial infarction.  Also has congestive heart failure with ejection fraction around 35% status post AICD placement.  Done very well.  Exercising regularly.  No episodes of chest pain or shortness of breath.  Done well.  Walks every day.  She has been seen by Dr. Elease Hashimoto since April 15, 2013.  She was last seen March 03, 2022 and she was checking her blood pressure at home which was typically okay.  Her weight was down about 7 pounds.  Today, she feels okay from a cardiac standpoint.  She is not having any shortness of breath or chest pain.  She has not experienced any lower extremity edema.  She is tolerating all her medications without any issues.  She is due for some lab work so we discussed adding on a lipid panel and LFTs.  She did have some recent lab work done through her primary care which we have reviewed today.  Otherwise, doing okay from a CV standpoint.  Reports no shortness of breath nor dyspnea on exertion. Reports no chest pain, pressure, or tightness. No edema, orthopnea, PND. Reports no palpitations.   Past Medical History    Past Medical History:  Diagnosis Date   AICD (automatic cardioverter/defibrillator) present 05/09/2010   Medtronic Virtuoso   CHF (congestive heart failure) (HCC)    EF 30-35% BY  ECHO. SHE HAS AN APICAL ANEURYSM   Coronary artery disease 12/12/2008   STATUS POST ANTERIOR WALL MYOCARDIAL INFARCTION. STATUS POST PTCA AND STENTING OF HER LAD   Dyslipidemia    Hypertension    Myocardial infarction (HCC) 12/12/2008   Presence of permanent cardiac pacemaker    Transient atrial fibrillation or flutter    Past Surgical History:  Procedure Laterality Date   CARDIAC CATHETERIZATION  12/12/2008   stent   CARDIAC DEFIBRILLATOR PLACEMENT  05/09/2010   Medtronic Virtuoso   CATARACT EXTRACTION W/PHACO Right 05/26/2020   Procedure: CATARACT EXTRACTION PHACO AND INTRAOCULAR LENS PLACEMENT (IOC) RIGHT;  Surgeon: Lockie Mola, MD;  Location: [facility];  Service: Ophthalmology;  Laterality: Right;  13.77 1:26.4 15.9%   CATARACT EXTRACTION W/PHACO Left 08/18/2020   Procedure: CATARACT EXTRACTION PHACO AND INTRAOCULAR LENS PLACEMENT (IOC) LEFT;  Surgeon: Lockie Mola, MD;  Location: [facility];  Service: Ophthalmology;  Laterality: Left;  4.87 0:57.0 8.6%   COLONOSCOPY     COLONOSCOPY WITH PROPOFOL N/A 06/13/2022   Procedure: COLONOSCOPY WITH PROPOFOL;  Surgeon: Regis Bill, MD;  Location: [facility];  Service: Endoscopy;  Laterality: N/A;  Please, call her cell # [phone] for her time.   ICD GENERATOR CHANGEOUT N/A 04/15/2019   Procedure: ICD GENERATOR CHANGEOUT;  Surgeon: Marinus Maw, MD;  Location: [facility] INVASIVE CV LAB;  Service: Cardiovascular;  Laterality: N/A;    Allergies  No Known Allergies   EKGs/Labs/Other Studies Reviewed:  The following studies were reviewed today: Cardiac Studies & Procedures       ECHOCARDIOGRAM  ECHOCARDIOGRAM COMPLETE 09/05/2017  Narrative *[facility]* [address] [place], [place] [address] [phone]  ------------------------------------------------------------------- Transthoracic Echocardiography  Patient:    Kenniya, Westrich MR #:       [id number] Study Date:  09/05/2017 Gender:     F Age:        66 Height:     180.3 cm Weight:     106.1 kg BSA:        2.34 m^2 Pt. Status: Room:  ATTENDING    Kristeen Miss, M.D. REFERRING    Kristeen Miss, M.D. SONOGRAPHER  Randa Evens, Will PERFORMING   [facility], Outpatient ORDERING     Nahser, Jr REFERRING    Nahser, Jr  cc:  ------------------------------------------------------------------- LV EF: 45% -   50%  ------------------------------------------------------------------- Indications:      (I50.22).  ------------------------------------------------------------------- History:   PMH:  AICD. Acquired from the patient and from the patient&'s chart.  Atrial fibrillation.  Coronary artery disease. Congestive heart failure.  PMH:   Myocardial infarction.  Risk factors:  Dyslipidemia.  ------------------------------------------------------------------- Study Conclusions  - Left ventricle: The cavity size was normal. Wall thickness was normal. Systolic function was mildly reduced. The estimated ejection fraction was in the range of 45% to 50%. Akinesis of the anteroseptal myocardium. Features are consistent with a pseudonormal left ventricular filling pattern, with concomitant abnormal relaxation and increased filling pressure (grade 2 diastolic dysfunction). - Mitral valve: There was mild to moderate regurgitation. - Right atrium: The atrium was mildly dilated. - Pulmonary arteries: Systolic pressure was moderately increased. PA peak pressure: 44 mm Hg (S).  ------------------------------------------------------------------- Study data:  Comparison was made to the study of 04/15/2013.  Study status:  Routine.  Procedure:  The patient reported no pain pre or post test. Transthoracic echocardiography for left ventricular function evaluation. Image quality was adequate.  Study completion: There were no complications.          Transthoracic echocardiography.  M-mode, complete 2D, spectral Doppler,  and color Doppler.  Birthdate:  Patient birthdate: [birth year].  Age:  Patient is 72 yr old.  Sex:  Gender: female.    BMI: 32.7 kg/m^2.  Blood pressure:     126/68  Patient status:  Outpatient.  Study date: Study date: 09/05/2017. Study time: 09:50 AM.  Location:  [facility]  -------------------------------------------------------------------  ------------------------------------------------------------------- Left ventricle:  The cavity size was normal. Wall thickness was normal. Systolic function was mildly reduced. The estimated ejection fraction was in the range of 45% to 50%.  Regional wall motion abnormalities:   Akinesis of the anteroseptal myocardium. Features are consistent with a pseudonormal left ventricular filling pattern, with concomitant abnormal relaxation and increased filling pressure (grade 2 diastolic dysfunction).  ------------------------------------------------------------------- Aortic valve:   Structurally normal valve.   Cusp separation was normal.  Doppler:  Transvalvular velocity was within the normal range. There was no stenosis. There was no regurgitation.  ------------------------------------------------------------------- Aorta:  Aortic root: The aortic root was normal in size. Ascending aorta: The ascending aorta was normal in size.  ------------------------------------------------------------------- Mitral valve:   Structurally normal valve.   Leaflet separation was normal.  Doppler:  Transvalvular velocity was within the normal range. There was no evidence for stenosis. There was mild to moderate regurgitation.    Peak gradient (D): 5 mm Hg.  ------------------------------------------------------------------- Left atrium:  The atrium was normal in size.  ------------------------------------------------------------------- Right ventricle:  The cavity  size was normal. Pacer wire or catheter noted in right ventricle. Systolic function was  normal.  ------------------------------------------------------------------- Pulmonic valve:    Structurally normal valve.   Cusp separation was normal.  Doppler:  Transvalvular velocity was within the normal range. There was no regurgitation.  ------------------------------------------------------------------- Tricuspid valve:   Structurally normal valve.   Leaflet separation was normal.  Doppler:  Transvalvular velocity was within the normal range. There was trivial regurgitation.  ------------------------------------------------------------------- Pulmonary artery:   Systolic pressure was moderately increased.  ------------------------------------------------------------------- Right atrium:  The atrium was mildly dilated.  ------------------------------------------------------------------- Pericardium:  There was no pericardial effusion.  ------------------------------------------------------------------- Measurements  Left ventricle                           Value        Reference LV ID, ED, PLAX chordal          (H)     59.8  mm     43 - 52 LV ID, ES, PLAX chordal          (H)     47.1  mm     23 - 38 LV fx shortening, PLAX chordal   (L)     21    %      >=29 LV PW thickness, ED                      11    mm     --------- IVS/LV PW ratio, ED                      0.89         <=1.3 Stroke volume, 2D                        74    ml     --------- Stroke volume/bsa, 2D                    32    ml/m^2 --------- LV ejection fraction, 1-p A4C            40    %      --------- LV end-diastolic volume, 2-p             123   ml     --------- LV end-systolic volume, 2-p              74    ml     --------- LV ejection fraction, 2-p                40    %      --------- Stroke volume, 2-p                       49    ml     --------- LV end-diastolic volume/bsa, 2-p         53    ml/m^2 --------- LV end-systolic volume/bsa, 2-p          32    ml/m^2 --------- Stroke volume/bsa, 2-p                    21    ml/m^2 --------- LV e&', lateral                           9.55  cm/s   --------- LV E/e&', lateral                         11.52        --------- LV e&', medial                            4.58  cm/s   --------- LV E/e&', medial                          24.02        --------- LV e&', average                           7.07  cm/s   --------- LV E/e&', average                         15.57        ---------  Ventricular septum                       Value        Reference IVS thickness, ED                        9.84  mm     ---------  LVOT                                     Value        Reference LVOT ID, S                               21    mm     --------- LVOT area                                3.46  cm^2   --------- LVOT ID                                  21    mm     --------- LVOT peak velocity, S                    86.5  cm/s   --------- LVOT mean velocity, S                    59.8  cm/s   --------- LVOT VTI, S                              21.5  cm     --------- LVOT peak gradient, S                    3     mm Hg  --------- Stroke volume (SV), LVOT DP              74.5  ml     --------- Stroke index (SV/bsa), LVOT DP           31.9  ml/m^2 ---------  Aorta                                    Value        Reference Aortic root ID, ED                       31    mm     --------- Ascending aorta ID, A-P, S               35    mm     ---------  Left atrium                              Value        Reference LA ID, A-P, ES                           41    mm     --------- LA ID/bsa, A-P                           1.75  cm/m^2 <=2.2 LA volume, S                             66    ml     --------- LA volume/bsa, S                         28.2  ml/m^2 --------- LA volume, ES, 1-p A4C                   58    ml     --------- LA volume/bsa, ES, 1-p A4C               24.8  ml/m^2 --------- LA volume, ES, 1-p A2C                   71    ml     --------- LA volume/bsa,  ES, 1-p A2C               30.4  ml/m^2 ---------  Mitral valve                             Value        Reference Mitral E-wave peak velocity              110   cm/s   --------- Mitral A-wave peak velocity              42.4  cm/s   --------- Mitral deceleration time                 197   ms     150 - 230 Mitral peak gradient, D                  5     mm Hg  --------- Mitral E/A ratio, peak                   2.6          ---------  Pulmonary arteries  Value        Reference PA pressure, S, DP               (H)     44    mm Hg  <=30  Tricuspid valve                          Value        Reference Tricuspid regurg peak velocity           321   cm/s   --------- Tricuspid peak RV-RA gradient            41    mm Hg  ---------  Systemic veins                           Value        Reference Estimated CVP                            3     mm Hg  ---------  Right ventricle                          Value        Reference RV pressure, S, DP               (H)     44    mm Hg  <=30 RV s&', lateral, S                        11.6  cm/s   ---------  Legend: (L)  and  (H)  mark values outside specified reference range.  ------------------------------------------------------------------- Prepared and Electronically Authenticated by  Kristeen Miss, M.D. 2018-10-24T11:14:20              EKG:  EKG is not ordered today.    Recent Labs: 08/25/2022: ALT 24; BUN 19; Creatinine, Ser 1.06; Potassium 4.4; Sodium 140; TSH 4.240  Recent Lipid Panel    Component Value Date/Time   CHOL 149 03/03/2022 1044   TRIG 130 03/03/2022 1044   HDL 60 03/03/2022 1044   CHOLHDL 2.5 03/03/2022 1044   CHOLHDL 2.4 05/10/2016 0743   VLDL 18 05/10/2016 0743   LDLCALC 66 03/03/2022 1044    Home Medications   Current Meds  Medication Sig   amiodarone (PACERONE) 200 MG tablet TAKE 1 TABLET BY MOUTH TWICE A WEEK   apixaban (ELIQUIS) 5 MG TABS tablet Take 1 tablet by mouth twice daily    atorvastatin (LIPITOR) 80 MG tablet Take 1 tablet (80 mg total) by mouth daily.   Calcium Carbonate-Vitamin D (CALCIUM 600+D PO) Take by mouth daily.   carvedilol (COREG) 25 MG tablet Take 1 tablet (25 mg total) by mouth 2 (two) times daily with a meal.   furosemide (LASIX) 40 MG tablet Take 1 tablet by mouth once daily   lisinopril (ZESTRIL) 20 MG tablet Take 1 tablet (20 mg total) by mouth daily.   Multiple Vitamin (MULTIVITAMIN WITH MINERALS) TABS tablet Take 1 tablet by mouth daily.   potassium chloride (KLOR-CON) 10 MEQ tablet Take 1 tablet by mouth once daily   spironolactone (ALDACTONE) 25 MG tablet Take 1 tablet (25 mg total) by mouth daily.     Review of Systems      All other systems reviewed  and are otherwise negative except as noted above.  Physical Exam    VS:  BP 138/60   Pulse 73   Ht 5\' 11"  (1.803 m)   Wt 220 lb 3.2 oz (99.9 kg)   SpO2 98%   BMI 30.71 kg/m  , BMI Body mass index is 30.71 kg/m.  Wt Readings from Last 3 Encounters:  03/12/23 220 lb 3.2 oz (99.9 kg)  08/25/22 214 lb (97.1 kg)  06/13/22 200 lb 6.4 oz (90.9 kg)     GEN: Well nourished, well developed, in no acute distress. HEENT: normal. Neck: Supple, no JVD, carotid bruits, or masses. Cardiac: RRR, no murmurs, rubs, or gallops. No clubbing, cyanosis, edema.  Radials/PT 2+ and equal bilaterally.  Respiratory:  Respirations regular and unlabored, clear to auscultation bilaterally. GI: Soft, nontender, nondistended. MS: No deformity or atrophy. Skin: Warm and dry, no rash. Neuro:  Strength and sensation are intact. Psych: Normal affect.  Assessment & Plan    CAD status post anterior wall myocardial infarction with stenting of LAD January 2010 (E 3 x 18 mm vision stent.  Post dilation with 3.25 mm [place] [employer]) -she is without chest pain or SOB today -Continue current medications which include amiodarone 200 mg twice a week, Eliquis 5 mg twice a day, Lipitor 80 mg daily, carvedilol 25 mg twice a  day, Lasix 40 mg daily, lisinopril 20 mg daily, potassium chloride 10 mEq daily, spironolactone 25 mg daily  Chronic combined systolic and diastolic CHF (EF improved to 45 to 50%) -update echo -no LE edema -no SOB  Placement of AICD -She follows with Dr. Ladona Ridgel and does remote check-in's -No issues lately  Paroxysmal atrial fibrillation -Normal sinus rhythm today -She has had no issues with her Eliquis and amiodarone -Continue carvedilol  Dyslipidemia -We have ordered an updated lipid panel and LFTs today -For now continue Lipitor 80 mg daily    Disposition: Follow up 1 year with Kristeen Miss, MD or APP.  Signed, Sharlene Dory, PA-C 03/12/2023, 11:32 AM  [facility]

## 2023-03-12 ENCOUNTER — Ambulatory Visit: Payer: Medicare PPO | Attending: Nurse Practitioner | Admitting: Physician Assistant

## 2023-03-12 ENCOUNTER — Ambulatory Visit: Payer: Medicare PPO | Admitting: Nurse Practitioner

## 2023-03-12 ENCOUNTER — Encounter: Payer: Self-pay | Admitting: Physician Assistant

## 2023-03-12 VITALS — BP 138/60 | HR 73 | Ht 71.0 in | Wt 220.2 lb

## 2023-03-12 DIAGNOSIS — I428 Other cardiomyopathies: Secondary | ICD-10-CM

## 2023-03-12 DIAGNOSIS — I2510 Atherosclerotic heart disease of native coronary artery without angina pectoris: Secondary | ICD-10-CM

## 2023-03-12 DIAGNOSIS — I255 Ischemic cardiomyopathy: Secondary | ICD-10-CM | POA: Diagnosis not present

## 2023-03-12 DIAGNOSIS — E782 Mixed hyperlipidemia: Secondary | ICD-10-CM

## 2023-03-12 DIAGNOSIS — I480 Paroxysmal atrial fibrillation: Secondary | ICD-10-CM

## 2023-03-12 DIAGNOSIS — I5042 Chronic combined systolic (congestive) and diastolic (congestive) heart failure: Secondary | ICD-10-CM

## 2023-03-12 DIAGNOSIS — I2583 Coronary atherosclerosis due to lipid rich plaque: Secondary | ICD-10-CM

## 2023-03-12 DIAGNOSIS — Z95810 Presence of automatic (implantable) cardiac defibrillator: Secondary | ICD-10-CM

## 2023-03-12 NOTE — Patient Instructions (Addendum)
Medication Instructions:   Your physician recommends that you continue on your current medications as directed. Please refer to the Current Medication list given to you today.  *If you need a refill on your cardiac medications before your next appointment, please call your pharmacy*   Lab Work:  LFT AND LIPIDS TODAY   If you have labs (blood work) drawn today and your tests are completely normal, you will receive your results only by: MyChart Message (if you have MyChart) OR A paper copy in the mail If you have any lab test that is abnormal or we need to change your treatment, we will call you to review the results.   Testing/Procedures:  NONE ORDERED  TODAY    Follow-Up: At [facility], you and your health needs are our priority.  As part of our continuing mission to provide you with exceptional heart care, we have created designated Provider Care Teams.  These Care Teams include your primary Cardiologist (physician) and Advanced Practice Providers (APPs -  Physician Assistants and Nurse Practitioners) who all work together to provide you with the care you need, when you need it.  We recommend signing up for the patient portal called "MyChart".  Sign up information is provided on this After Visit Summary.  MyChart is used to connect with patients for Virtual Visits (Telemedicine).  Patients are able to view lab/test results, encounter notes, upcoming appointments, etc.  Non-urgent messages can be sent to your provider as well.   To learn more about what you can do with MyChart, go to [email or web address].    Your next appointment:   1 year(s)  Provider:   Elease Hashimoto  MD / Asa Lente   Other Instructions

## 2023-03-13 ENCOUNTER — Other Ambulatory Visit: Payer: Self-pay | Admitting: Cardiovascular Disease

## 2023-03-13 DIAGNOSIS — I2510 Atherosclerotic heart disease of native coronary artery without angina pectoris: Secondary | ICD-10-CM

## 2023-03-13 DIAGNOSIS — E782 Mixed hyperlipidemia: Secondary | ICD-10-CM

## 2023-03-13 LAB — HEPATIC FUNCTION PANEL
ALT: 21 IU/L (ref 0–32)
AST: 22 IU/L (ref 0–40)
Albumin: 4.6 g/dL (ref 3.8–4.8)
Alkaline Phosphatase: 125 IU/L — ABNORMAL HIGH (ref 44–121)
Bilirubin Total: 0.6 mg/dL (ref 0.0–1.2)
Bilirubin, Direct: 0.2 mg/dL (ref 0.00–0.40)
Total Protein: 7.3 g/dL (ref 6.0–8.5)

## 2023-03-13 LAB — LIPID PANEL
Chol/HDL Ratio: 2.2 ratio (ref 0.0–4.4)
Cholesterol, Total: 123 mg/dL (ref 100–199)
HDL: 57 mg/dL (ref >39)
LDL Chol Calc (NIH): 47 mg/dL (ref 0–99)
Triglycerides: 105 mg/dL (ref 0–149)
VLDL Cholesterol Cal: 19 mg/dL (ref 5–40)

## 2023-04-17 ENCOUNTER — Ambulatory Visit (INDEPENDENT_AMBULATORY_CARE_PROVIDER_SITE_OTHER): Payer: Medicare PPO

## 2023-04-17 DIAGNOSIS — I428 Other cardiomyopathies: Secondary | ICD-10-CM | POA: Diagnosis not present

## 2023-04-17 DIAGNOSIS — I5042 Chronic combined systolic (congestive) and diastolic (congestive) heart failure: Secondary | ICD-10-CM

## 2023-04-17 LAB — CUP PACEART REMOTE DEVICE CHECK
Battery Remaining Longevity: 103 mo
Battery Voltage: 3.01 V
Brady Statistic RV Percent Paced: 0.01 %
Date Time Interrogation Session: 20240604033322
HighPow Impedance: 54 Ohm
HighPow Impedance: 69 Ohm
Implantable Lead Connection Status: 753985
Implantable Lead Implant Date: 20110627
Implantable Lead Location: 753860
Implantable Lead Model: 6947
Implantable Pulse Generator Implant Date: 20200602
Lead Channel Impedance Value: 342 Ohm
Lead Channel Impedance Value: 437 Ohm
Lead Channel Pacing Threshold Amplitude: 0.875 V
Lead Channel Pacing Threshold Pulse Width: 0.4 ms
Lead Channel Sensing Intrinsic Amplitude: 8.75 mV
Lead Channel Sensing Intrinsic Amplitude: 8.75 mV
Lead Channel Setting Pacing Amplitude: 2.5 V
Lead Channel Setting Pacing Pulse Width: 0.4 ms
Lead Channel Setting Sensing Sensitivity: 0.3 mV
Zone Setting Status: 755011
Zone Setting Status: 755011

## 2023-05-07 ENCOUNTER — Other Ambulatory Visit: Payer: Self-pay | Admitting: Cardiovascular Disease

## 2023-05-10 NOTE — Progress Notes (Signed)
Remote ICD transmission.   

## 2023-06-27 ENCOUNTER — Other Ambulatory Visit: Payer: Self-pay | Admitting: Cardiovascular Disease

## 2023-07-10 ENCOUNTER — Other Ambulatory Visit: Payer: Self-pay | Admitting: Cardiovascular Disease

## 2023-07-10 DIAGNOSIS — I480 Paroxysmal atrial fibrillation: Secondary | ICD-10-CM

## 2023-07-10 NOTE — Telephone Encounter (Signed)
Prescription refill request for Eliquis received. Indication:afib Last office visit:4/24 Scr:0.9  3/24 Age: 72 Weight:99.9  kg  Prescription refilled

## 2023-07-17 ENCOUNTER — Ambulatory Visit (INDEPENDENT_AMBULATORY_CARE_PROVIDER_SITE_OTHER): Payer: Medicare PPO

## 2023-07-17 DIAGNOSIS — I428 Other cardiomyopathies: Secondary | ICD-10-CM

## 2023-07-17 DIAGNOSIS — I5042 Chronic combined systolic (congestive) and diastolic (congestive) heart failure: Secondary | ICD-10-CM

## 2023-07-17 LAB — CUP PACEART REMOTE DEVICE CHECK
Battery Remaining Longevity: 100 mo
Battery Voltage: 3 V
Brady Statistic RV Percent Paced: 0.01 %
Date Time Interrogation Session: 20240903042302
HighPow Impedance: 55 Ohm
HighPow Impedance: 70 Ohm
Implantable Lead Connection Status: 753985
Implantable Lead Implant Date: 20110627
Implantable Lead Location: 753860
Implantable Lead Model: 6947
Implantable Pulse Generator Implant Date: 20200602
Lead Channel Impedance Value: 342 Ohm
Lead Channel Impedance Value: 456 Ohm
Lead Channel Pacing Threshold Amplitude: 0.75 V
Lead Channel Pacing Threshold Pulse Width: 0.4 ms
Lead Channel Sensing Intrinsic Amplitude: 9.875 mV
Lead Channel Sensing Intrinsic Amplitude: 9.875 mV
Lead Channel Setting Pacing Amplitude: 2.5 V
Lead Channel Setting Pacing Pulse Width: 0.4 ms
Lead Channel Setting Sensing Sensitivity: 0.3 mV
Zone Setting Status: 755011
Zone Setting Status: 755011

## 2023-07-25 NOTE — Progress Notes (Signed)
Remote ICD transmission.   

## 2023-08-28 ENCOUNTER — Other Ambulatory Visit: Payer: Self-pay | Admitting: Internal Medicine

## 2023-08-28 ENCOUNTER — Other Ambulatory Visit: Payer: Self-pay | Admitting: Cardiovascular Disease

## 2023-09-17 ENCOUNTER — Ambulatory Visit: Payer: Medicare PPO | Attending: Internal Medicine | Admitting: Internal Medicine

## 2023-09-17 ENCOUNTER — Encounter: Payer: Self-pay | Admitting: Internal Medicine

## 2023-09-17 VITALS — BP 142/72 | HR 74 | Ht 71.0 in | Wt 214.0 lb

## 2023-09-17 DIAGNOSIS — I480 Paroxysmal atrial fibrillation: Secondary | ICD-10-CM | POA: Diagnosis not present

## 2023-09-17 LAB — CUP PACEART INCLINIC DEVICE CHECK
Date Time Interrogation Session: 20241104204807
Implantable Lead Connection Status: 753985
Implantable Lead Implant Date: 20110627
Implantable Lead Location: 753860
Implantable Lead Model: 6947
Implantable Pulse Generator Implant Date: 20200602

## 2023-09-17 NOTE — Progress Notes (Signed)
HPI [name] returns today for followup. She is a pleasant 72 yo woman with an ICM, chronic systolic heart failure, PAF, and s/p ICD insertion. She underwent gen change out over 4 years ago. She denies chest pain or sob but she notes that she has not been quite as active and has gained a few pounds.   No Known Allergies   Current Outpatient Medications  Medication Sig Dispense Refill   amiodarone (PACERONE) 200 MG tablet TAKE 1 TABLET BY MOUTH TWICE A WEEK 24 tablet 2   apixaban (ELIQUIS) 5 MG TABS tablet Take 1 tablet by mouth twice daily 180 tablet 1   atorvastatin (LIPITOR) 80 MG tablet Take 1 tablet by mouth once daily 90 tablet 3   Calcium Carbonate-Vitamin D (CALCIUM 600+D PO) Take by mouth daily.     carvedilol (COREG) 25 MG tablet TAKE 1 TABLET BY MOUTH TWICE DAILY WITH A MEAL 180 tablet 1   furosemide (LASIX) 40 MG tablet Take 1 tablet by mouth once daily 90 tablet 3   lisinopril (ZESTRIL) 20 MG tablet Take 1 tablet by mouth once daily 90 tablet 3   Multiple Vitamin (MULTIVITAMIN WITH MINERALS) TABS tablet Take 1 tablet by mouth daily.     potassium chloride (KLOR-CON) 10 MEQ tablet Take 1 tablet by mouth once daily 90 tablet 1   spironolactone (ALDACTONE) 25 MG tablet Take 1 tablet by mouth once daily 90 tablet 1   No current facility-administered medications for this visit.     Past Medical History:  Diagnosis Date   AICD (automatic cardioverter/defibrillator) present 05/09/2010   Medtronic Virtuoso   CHF (congestive heart failure) (HCC)    EF 30-35% BY ECHO. SHE HAS AN APICAL ANEURYSM   Coronary artery disease 12/12/2008   STATUS POST ANTERIOR WALL MYOCARDIAL INFARCTION. STATUS POST PTCA AND STENTING OF HER LAD   Dyslipidemia    Hypertension    Myocardial infarction (HCC) 12/12/2008   Presence of permanent cardiac pacemaker    Transient atrial fibrillation or flutter     ROS:   All systems reviewed and negative except as noted in the HPI.   Past  Surgical History:  Procedure Laterality Date   CARDIAC CATHETERIZATION  12/12/2008   stent   CARDIAC DEFIBRILLATOR PLACEMENT  05/09/2010   Medtronic Virtuoso   CATARACT EXTRACTION W/PHACO Right 05/26/2020   Procedure: CATARACT EXTRACTION PHACO AND INTRAOCULAR LENS PLACEMENT (IOC) RIGHT;  Surgeon: Lockie Mola, MD;  Location: [facility];  Service: Ophthalmology;  Laterality: Right;  13.77 1:26.4 15.9%   CATARACT EXTRACTION W/PHACO Left 08/18/2020   Procedure: CATARACT EXTRACTION PHACO AND INTRAOCULAR LENS PLACEMENT (IOC) LEFT;  Surgeon: Lockie Mola, MD;  Location: [facility];  Service: Ophthalmology;  Laterality: Left;  4.87 0:57.0 8.6%   COLONOSCOPY     COLONOSCOPY WITH PROPOFOL N/A 06/13/2022   Procedure: COLONOSCOPY WITH PROPOFOL;  Surgeon: Regis Bill, MD;  Location: [facility];  Service: Endoscopy;  Laterality: N/A;  Please, call her cell # [phone] for her time.   ICD GENERATOR CHANGEOUT N/A 04/15/2019   Procedure: ICD GENERATOR CHANGEOUT;  Surgeon: Marinus Maw, MD;  Location: [facility] INVASIVE CV LAB;  Service: Cardiovascular;  Laterality: N/A;     Family History  Problem Relation Age of Onset   Hypertension Mother    Heart disease Sister    Heart disease Brother    Breast cancer Neg Hx      Social History   Socioeconomic History   Marital  status: Widowed    Spouse name: Not on file   Number of children: Not on file   Years of education: Not on file   Highest education level: Not on file  Occupational History   Not on file  Tobacco Use   Smoking status: Former    Current packs/day: 0.00    Types: Cigarettes    Quit date: 12/14/2008    Years since quitting: 14.7   Smokeless tobacco: Never  Vaping Use   Vaping status: Never Used  Substance and Sexual Activity   Alcohol use: No   Drug use: No   Sexual activity: Not on file  Other Topics Concern   Not on file  Social History Narrative   Not on file   Social  Determinants of Health   Financial Resource Strain: Not on file  Food Insecurity: Not on file  Transportation Needs: Not on file  Physical Activity: Not on file  Stress: Not on file  Social Connections: Not on file  Intimate Partner Violence: Not on file     BP (!) 142/72   Pulse 74   Ht 5\' 11"  (1.803 m)   Wt 214 lb (97.1 kg)   SpO2 98%   BMI 29.85 kg/m   Physical Exam:  Well appearing NAD HEENT: Unremarkable Neck:  No JVD, no thyromegally Lymphatics:  No adenopathy Back:  No CVA tenderness Lungs:  Clear HEART:  Regular rate rhythm, no murmurs, no rubs, no clicks Abd:  soft, positive bowel sounds, no organomegally, no rebound, no guarding Ext:  2 plus pulses, no edema, no cyanosis, no clubbing Skin:  No rashes no nodules Neuro:  CN II through XII intact, motor grossly intact  EKG - nsr   DEVICE  Normal device function.  See PaceArt for details.   Assess/Plan: 1. Chronic systolic heart failure -her symptoms are class 2. She is encouraged to increase her physical activity. 2. HTN - her bp is a little high today. I asked her to try and lose weight. 3. ICD - her Medtronic DDD ICD is working normally. We will recheck in several months. 4. PAF - she is maintaining NSR. She will continue her current meds. I encouraged her to lose weight. Also, I asked her to increase her dose of amiodarone to 3 times a week.   Leonia Reeves.D.

## 2023-09-17 NOTE — Patient Instructions (Signed)
Medication Instructions:  Your physician recommends that you continue on your current medications as directed. Please refer to the Current Medication list given to you today.  *If you need a refill on your cardiac medications before your next appointment, please call your pharmacy*  Lab Work: None ordered.  If you have labs (blood work) drawn today and your tests are completely normal, you will receive your results only by: MyChart Message (if you have MyChart) OR A paper copy in the mail If you have any lab test that is abnormal or we need to change your treatment, we will call you to review the results.  Testing/Procedures: None ordered.  Follow-Up: At [facility], you and your health needs are our priority.  As part of our continuing mission to provide you with exceptional heart care, we have created designated Provider Care Teams.  These Care Teams include your primary Cardiologist (physician) and Advanced Practice Providers (APPs -  Physician Assistants and Nurse Practitioners) who all work together to provide you with the care you need, when you need it.  We recommend signing up for the patient portal called "MyChart".  Sign up information is provided on this After Visit Summary.  MyChart is used to connect with patients for Virtual Visits (Telemedicine).  Patients are able to view lab/test results, encounter notes, upcoming appointments, etc.  Non-urgent messages can be sent to your provider as well.   To learn more about what you can do with MyChart, go to [email or web address].    Your next appointment:   1 year(s)  The format for your next appointment:   In Person  Provider:   [name], MD{or one of the following Advanced Practice Providers on your designated Care Team:   Francis Dowse, [place] Casimiro Needle "Mardelle Matte" [place], [place] Earnest Rosier, NP  Remote monitoring is used to monitor your Pacemaker/ ICD from home. This monitoring reduces the number of office visits required  to check your device to one time per year. It allows [place] to keep an eye on the functioning of your device to ensure it is working properly.   Important Information About Sugar

## 2023-09-18 ENCOUNTER — Other Ambulatory Visit: Payer: Self-pay | Admitting: Internal Medicine

## 2023-09-18 ENCOUNTER — Telehealth: Payer: Self-pay | Admitting: Internal Medicine

## 2023-09-18 MED ORDER — AMIODARONE HCL 200 MG PO TABS: ORAL_TABLET | ORAL | 3 refills | Status: DC

## 2023-09-18 NOTE — Telephone Encounter (Signed)
Per Dr. Lubertha Basque last office note,  I encouraged her to lose weight. Also, I asked her to increase her dose of amiodarone to 3 times a week.   Gregg Taylor,M.D. pt's medication was resent to pt's pharmacy as stated. Confirmation received

## 2023-09-18 NOTE — Telephone Encounter (Signed)
*  STAT* If patient is at the pharmacy, call can be transferred to refill team.   1. Which medications need to be refilled? (please list name of each medication and dose if known)   amiodarone (PACERONE) 200 MG tablet    2. Which pharmacy/location (including street and city if local pharmacy) is medication to be sent to? [facility] [address], [place] - [address]    3. Do they need a 30 day or 90 day supply? 90  Almost out, states was increased to tid

## 2023-10-16 ENCOUNTER — Ambulatory Visit (INDEPENDENT_AMBULATORY_CARE_PROVIDER_SITE_OTHER): Payer: Medicare PPO

## 2023-10-16 DIAGNOSIS — I428 Other cardiomyopathies: Secondary | ICD-10-CM

## 2023-10-16 LAB — CUP PACEART REMOTE DEVICE CHECK
Battery Remaining Longevity: 96 mo
Battery Voltage: 3 V
Brady Statistic RV Percent Paced: 0 %
Date Time Interrogation Session: 20241203033527
HighPow Impedance: 56 Ohm
HighPow Impedance: 70 Ohm
Implantable Lead Connection Status: 753985
Implantable Lead Implant Date: 20110627
Implantable Lead Location: 753860
Implantable Lead Model: 6947
Implantable Pulse Generator Implant Date: 20200602
Lead Channel Impedance Value: 342 Ohm
Lead Channel Impedance Value: 437 Ohm
Lead Channel Pacing Threshold Amplitude: 1 V
Lead Channel Pacing Threshold Pulse Width: 0.4 ms
Lead Channel Sensing Intrinsic Amplitude: 9.875 mV
Lead Channel Sensing Intrinsic Amplitude: 9.875 mV
Lead Channel Setting Pacing Amplitude: 2.5 V
Lead Channel Setting Pacing Pulse Width: 0.4 ms
Lead Channel Setting Sensing Sensitivity: 0.3 mV
Zone Setting Status: 755011
Zone Setting Status: 755011

## 2023-10-22 ENCOUNTER — Other Ambulatory Visit: Payer: Self-pay | Admitting: Internal Medicine

## 2023-10-22 DIAGNOSIS — Z1231 Encounter for screening mammogram for malignant neoplasm of breast: Secondary | ICD-10-CM

## 2023-12-11 ENCOUNTER — Ambulatory Visit
Admission: RE | Admit: 2023-12-11 | Discharge: 2023-12-11 | Disposition: A | Discharge status: Home / Self Care | Payer: Medicare PPO | Source: Ambulatory Visit | Attending: Internal Medicine | Admitting: Internal Medicine

## 2023-12-11 DIAGNOSIS — Z1231 Encounter for screening mammogram for malignant neoplasm of breast: Secondary | ICD-10-CM | POA: Insufficient documentation

## 2023-12-28 ENCOUNTER — Other Ambulatory Visit: Payer: Self-pay | Admitting: Cardiovascular Disease

## 2023-12-28 DIAGNOSIS — I480 Paroxysmal atrial fibrillation: Secondary | ICD-10-CM

## 2023-12-28 NOTE — Telephone Encounter (Signed)
Prescription refill request for Eliquis received. Indication:afib Last office visit:11/24 Scr:1.0  9/24 Age: 73 Weight:97.1  kg  Prescription refilled

## 2024-01-15 ENCOUNTER — Ambulatory Visit (INDEPENDENT_AMBULATORY_CARE_PROVIDER_SITE_OTHER): Payer: Medicare PPO

## 2024-01-15 DIAGNOSIS — I5042 Chronic combined systolic (congestive) and diastolic (congestive) heart failure: Secondary | ICD-10-CM

## 2024-01-15 DIAGNOSIS — I428 Other cardiomyopathies: Secondary | ICD-10-CM | POA: Diagnosis not present

## 2024-01-21 LAB — CUP PACEART REMOTE DEVICE CHECK
Battery Remaining Longevity: 92 mo
Battery Voltage: 3 V
Brady Statistic RV Percent Paced: 0 %
Date Time Interrogation Session: 20250304042306
HighPow Impedance: 55 Ohm
HighPow Impedance: 75 Ohm
Implantable Lead Connection Status: 753985
Implantable Lead Implant Date: 20110627
Implantable Lead Location: 753860
Implantable Lead Model: 6947
Implantable Pulse Generator Implant Date: 20200602
Lead Channel Impedance Value: 342 Ohm
Lead Channel Impedance Value: 437 Ohm
Lead Channel Pacing Threshold Amplitude: 1 V
Lead Channel Pacing Threshold Pulse Width: 0.4 ms
Lead Channel Sensing Intrinsic Amplitude: 9.125 mV
Lead Channel Sensing Intrinsic Amplitude: 9.125 mV
Lead Channel Setting Pacing Amplitude: 2.5 V
Lead Channel Setting Pacing Pulse Width: 0.4 ms
Lead Channel Setting Sensing Sensitivity: 0.3 mV
Zone Setting Status: 755011
Zone Setting Status: 755011

## 2024-02-15 ENCOUNTER — Other Ambulatory Visit: Payer: Self-pay | Admitting: Cardiovascular Disease

## 2024-02-15 DIAGNOSIS — E782 Mixed hyperlipidemia: Secondary | ICD-10-CM

## 2024-02-15 DIAGNOSIS — I2510 Atherosclerotic heart disease of native coronary artery without angina pectoris: Secondary | ICD-10-CM

## 2024-02-19 NOTE — Addendum Note (Signed)
 Addended by: Geralyn Flash D on: 02/19/2024 09:20 AM   Modules accepted: Orders

## 2024-02-19 NOTE — Progress Notes (Signed)
 Remote ICD transmission.

## 2024-03-06 ENCOUNTER — Other Ambulatory Visit: Payer: Self-pay | Admitting: Cardiovascular Disease

## 2024-03-13 NOTE — Progress Notes (Signed)
 Cardiology Office Note   Date:  03/14/2024   ID:  [name], DOB 11/16/1950, MRN [id number]  PCP:  Little Riff, MD  Cardiologist:   Ahmad Alert, MD   Chief Complaint  Patient presents with   Atrial Fibrillation        Coronary Artery Disease   1. Coronary artery disease-status post anterior wall myocardial infarction. She status post stenting of her LAD in her Jan, 2010. We placed a 3.0 x 18 mm vision stent. Was post dilated with a 3.25 mm [place] Voyager 2. Chronic combined systolic and diastolic congestive heart failure: with EF of 30-35% associated with  an apical aneurysm.   EF has improved to 45-50%.  3. Status post AICD placement    4. Paroxysmal  atrial fibrillation 5. Dyslipidemia 6.   [name] is a 73 year old female with a history of coronary artery disease. She status post anterior wall myocardial infarction. She has congestive heart failure with an ejection fraction of around 35%.  She's done very well since I last saw her. He is exercising on a fairly regular basis. She's not had any episodes of chest or shortness breath.  She has done well.  - walks every day.  No chest pain or dyspnea.   She has not been as careful with her diet recently. She admits that she needs to work on a good low-fat, low carbohydrate diet.  April 15, 2013:  [name] is doing well.  No CP or dyspnea.  Still walks every day ( 3/4 of a mile).  She is due to have an echo.   Dec. 2, 2014:  [name] is doing well.  No CP or dyspnea.    Still walking (3 laps around the mall  Or 30 minutes)  Every other day.  Echocardiogram in June, 2014 sutures a persistently reduced left ventricular systolic function with an EF of around 30%. She has mild pulmonary hypertension with estimated PA pressure of 40. Left ventricle: Akinesis septum. Severe hypokinesis of the apex. The cavity size was normal. Wall thickness was increased in a pattern of mild LVH. The estimated ejection fraction was 30%. Findings  consistent with left ventricular diastolic dysfunction. - Mitral valve: Mild regurgitation. - Left atrium: The atrium was mildly dilated. - Pulmonary arteries: PA peak pressure: 40mm Hg    April 22, 2014:  [name] feels nervous today.  HR and BP are up. She denies any chest pain or shortness of breath. She has had atrial fibrillation before. EKG today reveals atrial fibrillation with a ventricular rate of 136.  July 01, 2014:  Sepideh is doing . She's not having episodes of chest pain or shortness of breath.  She's able to get out and exercise on a fairly regular basis.    January 27, 2015:   [name] is a 73 y.o. female who presents for her CAD. Doing  Well.  Exercising regulalry.   No CP with exercise.  Has lost some weight.   20 labs.  Checks her BP regularly  - its ok at home.  Watches her salt  Sept. 14, 2016: Doing well.   No CP , no dyspnea.  Exercising regularly, watching her diet Her husband died of a cardiac arrest on 2024-04-12.  She is still upset   February 02, 2016:  Walking every day  No CP .  Has not been able to lose weight .   Sept. 20, 2017 Feeling well.  No CP or dyspnea Getting some exercise  March 21,2018:    Doing well.  No CP , feeling well  Is working out at [employer] now.  No CP or dyspnea  Oct. 23, 2018:  No CP or dyspnea  No palpitations Feeling well No syncope or presyncope Working out regularly   March 12, 2018:  [name] is seen today for follow-up visit.  She has a history of coronary artery disease and congestive heart failure.  She also has paroxysmal atrial fibrillation.  She has a history of hyperlipidemia. Her sister diet recently at age 42.  No CP or dyspnea  Getting some exercise   BP has been up related to stress of sisters death     12-05-2019:  [name] is seen today for follow-up of her coronary artery disease, hyperlipidemia, congestive heart failure.  She also has a history of paroxysmal atrial fibrillation. Her  last echocardiogram was October, 2018.  At that time her left ventricular systolic function had improved to 45 to 50%.  She still has akinesis of the anteroseptal myocardium.  She has mild to moderate mitral regurgitation. She has moderate pulmonary hypertension with an estimated PA pressure of 44 mmHg. Staying healthy,  Still fairly active Wt is 225 lbs.  VS looks good.  still eating sweets  Discussed avoiding " white, wheat, and sweats"  Feb. 15, 2022:  Doing well.  No CP , no palpitations .  Had ICD change out in June, 2020 Getting some exercise  EF is 45 - 50 %   March 03, 2022: [name] is seen today for follow-up visit.  She has a history of congestive heart failure, coronary artery disease, hyperlipidemia.  She also has  paroxysmal atrial fibrillation.  She checks her bp at home - typically is ok  Wt is 217 lbs ( down 7 lbs )   Mar 14, 2024 [name] is seen for follow up . Hx of CHF, CAD, HLD  , obesity  PAF , no CP , no dyspnea   Labs from April 2024  LDL is 47 Trig = 105    Past Medical History:  Diagnosis Date   AICD (automatic cardioverter/defibrillator) present 05/09/2010   Medtronic Virtuoso   CHF (congestive heart failure) (HCC)    EF 30-35% BY ECHO. SHE HAS AN APICAL ANEURYSM   Coronary artery disease 12/12/2008   STATUS POST ANTERIOR WALL MYOCARDIAL INFARCTION. STATUS POST PTCA AND STENTING OF HER LAD   Dyslipidemia    Hypertension    Myocardial infarction (HCC) 12/12/2008   Presence of permanent cardiac pacemaker    Transient atrial fibrillation or flutter     Past Surgical History:  Procedure Laterality Date   CARDIAC CATHETERIZATION  12/12/2008   stent   CARDIAC DEFIBRILLATOR PLACEMENT  05/09/2010   Medtronic Virtuoso   CATARACT EXTRACTION W/PHACO Right 05/26/2020   Procedure: CATARACT EXTRACTION PHACO AND INTRAOCULAR LENS PLACEMENT (IOC) RIGHT;  Surgeon: Annell Kidney, MD;  Location: [facility];  Service: Ophthalmology;  Laterality:  Right;  13.77 1:26.4 15.9%   CATARACT EXTRACTION W/PHACO Left 08/18/2020   Procedure: CATARACT EXTRACTION PHACO AND INTRAOCULAR LENS PLACEMENT (IOC) LEFT;  Surgeon: Annell Kidney, MD;  Location: [facility];  Service: Ophthalmology;  Laterality: Left;  4.87 0:57.0 8.6%   COLONOSCOPY     COLONOSCOPY WITH PROPOFOL  N/A 06/13/2022   Procedure: COLONOSCOPY WITH PROPOFOL ;  Surgeon: Shane Darling, MD;  Location: [facility];  Service: Endoscopy;  Laterality: N/A;  Please, call her cell # [phone] for her time.   ICD  GENERATOR CHANGEOUT N/A 04/15/2019   Procedure: ICD GENERATOR CHANGEOUT;  Surgeon: Tammie Fall, MD;  Location: [facility] INVAS[facility] CV LAB;  Service: Cardiovascular;  Laterality: N/A;     Current Outpatient Medications  Medication Sig Dispense Refill   amiodarone  (PACERONE ) 200 MG tablet TAKE 1 TABLET BY MOUTH THREE A WEEK 36 tablet 3   apixaban  (ELIQUIS ) 5 MG TABS tablet Take 1 tablet by mouth twice daily 180 tablet 1   Calcium  Carbonate-Vitamin D (CALCIUM  600+D PO) Take by mouth daily.     furosemide  (LASIX ) 40 MG tablet Take 1 tablet by mouth once daily 90 tablet 3   lisinopril  (ZESTRIL ) 20 MG tablet Take 1 tablet by mouth once daily 90 tablet 3   Multiple Vitamin (MULTIVITAMIN WITH MINERALS) TABS tablet Take 1 tablet by mouth daily.     NEOMYCIN -POLYMYXIN-HYDROCORTISONE (CORTISPORIN) 1 % SOLN OTIC solution Place 3 drops in ear(s).     atorvastatin  (LIPITOR) 80 MG tablet Take 1 tablet (80 mg total) by mouth daily. 90 tablet 3   carvedilol  (COREG ) 25 MG tablet Take 1 tablet (25 mg total) by mouth 2 (two) times daily with a meal. 180 tablet 3   potassium chloride  (KLOR-CON ) 10 MEQ tablet Take 1 tablet (10 mEq total) by mouth daily. 90 tablet 3   spironolactone  (ALDACTONE ) 25 MG tablet Take 1 tablet (25 mg total) by mouth daily. 90 tablet 3   No current facility-administered medications for this visit.    Allergies:   Patient has no known allergies.    Social  History:  The patient  reports that she quit smoking about 15 years ago. Her smoking use included cigarettes. She has never used smokeless tobacco. She reports that she does not drink alcohol and does not use drugs.   Family History:  The patient's family history includes Heart disease in her brother and sister; Hypertension in her mother.    ROS:  Noted in current history.  Otherwise review of systems is negative   Physical Exam: Blood pressure 132/76, pulse 71, height 5\' 11"  (1.803 m), weight 210 lb (95.3 kg), SpO2 97%.       GEN:  Well nourished, well developed in no acute distress HEENT: Normal NECK: No JVD; No carotid bruits LYMPHATICS: No lymphadenopathy CARDIAC: RRR , no murmurs, rubs, gallops RESPIRATORY:  Clear to auscultation without rales, wheezing or rhonchi  ABDOMEN: Soft, non-tender, non-distended MUSCULOSKELETAL:  No edema; No deformity  SKIN: Warm and dry NEUROLOGIC:  Alert and oriented x 3     EKG:          Recent Labs: No results found for requested labs within last 365 days.    Lipid Panel    Component Value Date/Time   CHOL 123 03/12/2023 1103   TRIG 105 03/12/2023 1103   HDL 57 03/12/2023 1103   CHOLHDL 2.2 03/12/2023 1103   CHOLHDL 2.4 05/10/2016 0743   VLDL 18 05/10/2016 0743   LDLCALC 47 03/12/2023 1103      Wt Readings from Last 3 Encounters:  03/14/24 210 lb (95.3 kg)  09/17/23 214 lb (97.1 kg)  03/12/23 220 lb 3.2 oz (99.9 kg)      Other studies Reviewed: Additional studies/ records that were reviewed today include: . Review of the above records demonstrates:    ASSESSMENT AND PLAN:  1. Coronary artery disease-     she denies any episodes of chest pain.  She has done well following her stent placement in 2010.  2. Congestive heart failure :  Continue current medications.  She is not having any shortness of breath.  Labs have been okay.  Will check basic metabolic profile today.  3. Status post AICD placement   -follows  with electrophysiology.  4. Paroxysmal  atrial fibrillation -     she is on Eliquis .  Amiodarone  200 mg 3 days a week.  Will check TSH today.  5. Dyslipidemia -      Check lipids, ALT, basic metabolic profile today.   Will have her see us  in 1 year.   Current medicines are reviewed at length with the patient today.  The patient does not have concerns regarding medicines.  The following changes have been made:  no change  Labs/ tests ordered today include:   Orders Placed This Encounter  Procedures   Lipid panel   ALT   Basic metabolic panel with GFR   CBC with Differential/Platelet   TSH     Disposition:        Signed, Ahmad Alert, MD  03/14/2024 8:29 AM    [facility] [address] [place], [place], [place]  [address] Phone: [phone]; Fax: [phone]

## 2024-03-14 ENCOUNTER — Encounter: Payer: Self-pay | Admitting: Cardiovascular Disease

## 2024-03-14 ENCOUNTER — Other Ambulatory Visit: Payer: Self-pay | Admitting: *Deleted

## 2024-03-14 ENCOUNTER — Ambulatory Visit: Payer: Medicare PPO | Attending: Cardiovascular Disease | Admitting: Cardiovascular Disease

## 2024-03-14 VITALS — BP 132/76 | HR 71 | Ht 71.0 in | Wt 210.0 lb

## 2024-03-14 DIAGNOSIS — I5022 Chronic systolic (congestive) heart failure: Secondary | ICD-10-CM | POA: Diagnosis not present

## 2024-03-14 DIAGNOSIS — I480 Paroxysmal atrial fibrillation: Secondary | ICD-10-CM

## 2024-03-14 DIAGNOSIS — Z79899 Other long term (current) drug therapy: Secondary | ICD-10-CM

## 2024-03-14 DIAGNOSIS — I2510 Atherosclerotic heart disease of native coronary artery without angina pectoris: Secondary | ICD-10-CM | POA: Diagnosis not present

## 2024-03-14 DIAGNOSIS — E782 Mixed hyperlipidemia: Secondary | ICD-10-CM

## 2024-03-14 DIAGNOSIS — I2583 Coronary atherosclerosis due to lipid rich plaque: Secondary | ICD-10-CM

## 2024-03-14 DIAGNOSIS — E785 Hyperlipidemia, unspecified: Secondary | ICD-10-CM

## 2024-03-14 LAB — CBC WITH DIFFERENTIAL/PLATELET

## 2024-03-14 MED ORDER — ATORVASTATIN CALCIUM 80 MG PO TABS: 80.0000 mg | ORAL_TABLET | Freq: Every day | ORAL | 3 refills | Status: AC

## 2024-03-14 MED ORDER — POTASSIUM CHLORIDE ER 10 MEQ PO TBCR: 10.0000 meq | EXTENDED_RELEASE_TABLET | Freq: Every day | ORAL | 3 refills | Status: AC

## 2024-03-14 MED ORDER — CARVEDILOL 25 MG PO TABS: 25.0000 mg | ORAL_TABLET | Freq: Two times a day (BID) | ORAL | 3 refills | Status: AC

## 2024-03-14 MED ORDER — SPIRONOLACTONE 25 MG PO TABS: 25.0000 mg | ORAL_TABLET | Freq: Every day | ORAL | 3 refills | Status: AC

## 2024-03-14 NOTE — Patient Instructions (Signed)
 Medication Instructions:  Your physician recommends that you continue on your current medications as directed. Please refer to the Current Medication list given to you today.  *If you need a refill on your cardiac medications before your next appointment, please call your pharmacy*  Lab Work: Your physician recommends that you have lab work today- BMET, CBC, TSH, ALT, and Lipid Panel If you have labs (blood work) drawn today and your tests are completely normal, you will receive your results only by: MyChart Message (if you have MyChart) OR A paper copy in the mail If you have any lab test that is abnormal or we need to change your treatment, we will call you to review the results.  Testing/Procedures: None ordered today.  Follow-Up: At [facility], you and your health needs are our priority.  As part of our continuing mission to provide you with exceptional heart care, our providers are all part of one team.  This team includes your primary Cardiologist (physician) and Advanced Practice Providers or APPs (Physician Assistants and Nurse Practitioners) who all work together to provide you with the care you need, when you need it.  Your next appointment:   1 year(s)  Provider:   General Cardiologist    We recommend signing up for the patient portal called "MyChart".  Sign up information is provided on this After Visit Summary.  MyChart is used to connect with patients for Virtual Visits (Telemedicine).  Patients are able to view lab/test results, encounter notes, upcoming appointments, etc.  Non-urgent messages can be sent to your provider as well.   To learn more about what you can do with MyChart, go to [email or web address].

## 2024-03-15 LAB — ALT: ALT: 19 IU/L (ref 0–32)

## 2024-03-15 LAB — CBC WITH DIFFERENTIAL/PLATELET
Basos: 1 %
EOS (ABSOLUTE): 0 10*3/uL (ref 0.0–0.2)
Eos: 3 %
Hematocrit: 38.7 % (ref 34.0–46.6)
Hemoglobin: 12.7 g/dL (ref 11.1–15.9)
Immature Granulocytes: 0 %
Immature Granulocytes: 0 10*3/uL (ref 0.0–0.1)
Lymphs: 23 %
MCH: 28.9 pg (ref 26.6–33.0)
MCHC: 32.8 g/dL (ref 31.5–35.7)
MCV: 88 fL (ref 79–97)
Monocytes Absolute: 0.1 10*3/uL (ref 0.0–0.4)
Monocytes Absolute: 0.5 10*3/uL (ref 0.1–0.9)
Monocytes: 10 %
Neutrophils Absolute: 1.2 10*3/uL (ref 0.7–3.1)
Neutrophils Absolute: 3.3 10*3/uL (ref 1.4–7.0)
Neutrophils: 63 %
Platelets: 178 10*3/uL (ref 150–450)
RBC: 4.39 x10E6/uL (ref 3.77–5.28)
RDW: 13.2 % (ref 11.7–15.4)
WBC: 5.2 10*3/uL (ref 3.4–10.8)

## 2024-03-15 LAB — BASIC METABOLIC PANEL WITH GFR
BUN/Creatinine Ratio: 18 (ref 12–28)
BUN: 22 mg/dL (ref 8–27)
CO2: 23 mmol/L (ref 20–29)
Calcium: 9.6 mg/dL (ref 8.7–10.3)
Chloride: 100 mmol/L (ref 96–106)
Creatinine, Ser: 1.23 mg/dL — ABNORMAL HIGH (ref 0.57–1.00)
Glucose: 113 mg/dL — ABNORMAL HIGH (ref 70–99)
Potassium: 4.7 mmol/L (ref 3.5–5.2)
Sodium: 139 mmol/L (ref 134–144)
eGFR: 47 mL/min/{1.73_m2} — ABNORMAL LOW (ref >59)

## 2024-03-15 LAB — LIPID PANEL
Chol/HDL Ratio: 2.5 ratio (ref 0.0–4.4)
Cholesterol, Total: 135 mg/dL (ref 100–199)
HDL: 55 mg/dL (ref >39)
LDL Chol Calc (NIH): 63 mg/dL (ref 0–99)
Triglycerides: 88 mg/dL (ref 0–149)
VLDL Cholesterol Cal: 17 mg/dL (ref 5–40)

## 2024-03-15 LAB — TSH: TSH: 3.92 u[IU]/mL (ref 0.450–4.500)

## 2024-04-15 ENCOUNTER — Ambulatory Visit (INDEPENDENT_AMBULATORY_CARE_PROVIDER_SITE_OTHER): Payer: Medicare PPO

## 2024-04-15 DIAGNOSIS — I428 Other cardiomyopathies: Secondary | ICD-10-CM

## 2024-04-15 LAB — CUP PACEART REMOTE DEVICE CHECK
Battery Remaining Longevity: 88 mo
Battery Voltage: 3 V
Brady Statistic RV Percent Paced: 0 %
Date Time Interrogation Session: 20250603001805
HighPow Impedance: 59 Ohm
HighPow Impedance: 79 Ohm
Implantable Lead Connection Status: 753985
Implantable Lead Implant Date: 20110627
Implantable Lead Location: 753860
Implantable Lead Model: 6947
Implantable Pulse Generator Implant Date: 20200602
Lead Channel Impedance Value: 380 Ohm
Lead Channel Impedance Value: 456 Ohm
Lead Channel Pacing Threshold Amplitude: 1 V
Lead Channel Pacing Threshold Pulse Width: 0.4 ms
Lead Channel Sensing Intrinsic Amplitude: 9.25 mV
Lead Channel Sensing Intrinsic Amplitude: 9.25 mV
Lead Channel Setting Pacing Amplitude: 2.5 V
Lead Channel Setting Pacing Pulse Width: 0.4 ms
Lead Channel Setting Sensing Sensitivity: 0.3 mV
Zone Setting Status: 755011
Zone Setting Status: 755011

## 2024-04-17 ENCOUNTER — Ambulatory Visit: Payer: Self-pay | Admitting: Internal Medicine

## 2024-04-23 ENCOUNTER — Other Ambulatory Visit: Payer: Self-pay | Admitting: Cardiovascular Disease

## 2024-06-10 NOTE — Progress Notes (Signed)
 Remote ICD transmission.

## 2024-06-19 ENCOUNTER — Other Ambulatory Visit: Payer: Self-pay | Admitting: *Deleted

## 2024-06-19 DIAGNOSIS — I480 Paroxysmal atrial fibrillation: Secondary | ICD-10-CM

## 2024-06-19 MED ORDER — APIXABAN 5 MG PO TABS: 5.0000 mg | ORAL_TABLET | Freq: Two times a day (BID) | ORAL | 1 refills | Status: DC

## 2024-06-19 NOTE — Telephone Encounter (Signed)
 Prescription refill request for Eliquis  received. Indication: afib  Last office visit: [place], 03/14/2024 Scr: 1.23, 03/14/2024 Age: 73 yo  Weight: 95.3 kg   Refill sent.

## 2024-07-15 ENCOUNTER — Ambulatory Visit (INDEPENDENT_AMBULATORY_CARE_PROVIDER_SITE_OTHER)

## 2024-07-15 DIAGNOSIS — I480 Paroxysmal atrial fibrillation: Secondary | ICD-10-CM | POA: Diagnosis not present

## 2024-07-17 LAB — CUP PACEART REMOTE DEVICE CHECK
Battery Remaining Longevity: 84 mo
Battery Voltage: 3 V
Brady Statistic RV Percent Paced: 0.01 %
Date Time Interrogation Session: 20250902001804
HighPow Impedance: 57 Ohm
HighPow Impedance: 80 Ohm
Implantable Lead Connection Status: 753985
Implantable Lead Implant Date: 20110627
Implantable Lead Location: 753860
Implantable Lead Model: 6947
Implantable Pulse Generator Implant Date: 20200602
Lead Channel Impedance Value: 380 Ohm
Lead Channel Impedance Value: 456 Ohm
Lead Channel Pacing Threshold Amplitude: 1 V
Lead Channel Pacing Threshold Pulse Width: 0.4 ms
Lead Channel Sensing Intrinsic Amplitude: 9.125 mV
Lead Channel Sensing Intrinsic Amplitude: 9.125 mV
Lead Channel Setting Pacing Amplitude: 2.5 V
Lead Channel Setting Pacing Pulse Width: 0.4 ms
Lead Channel Setting Sensing Sensitivity: 0.3 mV
Zone Setting Status: 755011
Zone Setting Status: 755011

## 2024-07-20 ENCOUNTER — Ambulatory Visit: Payer: Self-pay | Admitting: Internal Medicine

## 2024-07-22 NOTE — Progress Notes (Signed)
Remote ICD Transmission.

## 2024-07-30 ENCOUNTER — Telehealth: Payer: Self-pay

## 2024-07-30 NOTE — Telephone Encounter (Signed)
 Call received from Pt.  Per Pt her device alarmed last night.  Pt is not ERI.  Attempted to assist Pt with sending transmission.  Error code 3230 noted.

## 2024-07-30 NOTE — Telephone Encounter (Signed)
 Spoke with pt along with tech support and they are sending her a new monitor. Pt should receive it in 7-10 business days

## 2024-08-20 ENCOUNTER — Other Ambulatory Visit: Payer: Self-pay | Admitting: Internal Medicine

## 2024-08-26 NOTE — Telephone Encounter (Signed)
 Call back received from Pt.  She states she received a new monitor.  She may have received a new handheld piece to old monitor (she gives serial number of original monitor).  Will schedule a transmission in one week to determine if device is working.

## 2024-08-26 NOTE — Telephone Encounter (Signed)
 Left detailed message requesting call back.  Transmission has not yet been received.  Equipment is not updated in [facility].

## 2024-09-02 ENCOUNTER — Ambulatory Visit: Attending: Internal Medicine

## 2024-09-03 NOTE — Telephone Encounter (Signed)
Pt is transmitting.

## 2024-09-11 ENCOUNTER — Ambulatory Visit: Attending: Internal Medicine | Admitting: Internal Medicine

## 2024-09-11 ENCOUNTER — Encounter: Payer: Self-pay | Admitting: Internal Medicine

## 2024-09-11 VITALS — BP 110/76 | HR 68 | Ht 71.0 in | Wt 215.0 lb

## 2024-09-11 DIAGNOSIS — I480 Paroxysmal atrial fibrillation: Secondary | ICD-10-CM

## 2024-09-11 DIAGNOSIS — Z95810 Presence of automatic (implantable) cardiac defibrillator: Secondary | ICD-10-CM

## 2024-09-11 DIAGNOSIS — I5022 Chronic systolic (congestive) heart failure: Secondary | ICD-10-CM | POA: Diagnosis not present

## 2024-09-11 LAB — CUP PACEART INCLINIC DEVICE CHECK
Date Time Interrogation Session: 20251030143811
Implantable Lead Connection Status: 753985
Implantable Lead Implant Date: 20110627
Implantable Lead Location: 753860
Implantable Lead Model: 6947
Implantable Pulse Generator Implant Date: 20200602

## 2024-09-11 NOTE — Progress Notes (Signed)
 HPI [name] returns today for followup. She is a pleasant 73 yo woman with an ICM, chronic systolic heart failure, PAF, and s/p ICD insertion. She underwent gen change out over 5 years ago. She denies chest pain or sob but she notes that she has not been quite as active and has gained a few pounds.   No Known Allergies   Current Outpatient Medications  Medication Sig Dispense Refill   amiodarone  (PACERONE ) 200 MG tablet TAKE ONE TABLET BY MOUTH THREE TIMES WEEKLY 36 tablet 3   apixaban  (ELIQUIS ) 5 MG TABS tablet Take 1 tablet (5 mg total) by mouth 2 (two) times daily. 180 tablet 1   atorvastatin  (LIPITOR) 80 MG tablet Take 1 tablet (80 mg total) by mouth daily. 90 tablet 3   Calcium  Carbonate-Vitamin D (CALCIUM  600+D PO) Take by mouth daily.     carvedilol  (COREG ) 25 MG tablet Take 1 tablet (25 mg total) by mouth 2 (two) times daily with a meal. 180 tablet 3   furosemide  (LASIX ) 40 MG tablet Take 1 tablet by mouth once daily 90 tablet 3   lisinopril  (ZESTRIL ) 20 MG tablet Take 1 tablet by mouth once daily 90 tablet 3   Multiple Vitamin (MULTIVITAMIN WITH MINERALS) TABS tablet Take 1 tablet by mouth daily.     NEOMYCIN -POLYMYXIN-HYDROCORTISONE (CORTISPORIN) 1 % SOLN OTIC solution Place 3 drops in ear(s).     potassium chloride  (KLOR-CON ) 10 MEQ tablet Take 1 tablet (10 mEq total) by mouth daily. 90 tablet 3   spironolactone  (ALDACTONE ) 25 MG tablet Take 1 tablet (25 mg total) by mouth daily. 90 tablet 3   No current facility-administered medications for this visit.     Past Medical History:  Diagnosis Date   AICD (automatic cardioverter/defibrillator) present 05/09/2010   Medtronic Virtuoso   CHF (congestive heart failure) (HCC)    EF 30-35% BY ECHO. SHE HAS AN APICAL ANEURYSM   Coronary artery disease 12/12/2008   STATUS POST ANTERIOR WALL MYOCARDIAL INFARCTION. STATUS POST PTCA AND STENTING OF HER LAD   Dyslipidemia    Hypertension    Myocardial infarction (HCC)  12/12/2008   Presence of permanent cardiac pacemaker    Transient atrial fibrillation or flutter     ROS:   All systems reviewed and negative except as noted in the HPI.   Past Surgical History:  Procedure Laterality Date   CARDIAC CATHETERIZATION  12/12/2008   stent   CARDIAC DEFIBRILLATOR PLACEMENT  05/09/2010   Medtronic Virtuoso   CATARACT EXTRACTION W/PHACO Right 05/26/2020   Procedure: CATARACT EXTRACTION PHACO AND INTRAOCULAR LENS PLACEMENT (IOC) RIGHT;  Surgeon: Mittie Gaskin, MD;  Location: [facility];  Service: Ophthalmology;  Laterality: Right;  13.77 1:26.4 15.9%   CATARACT EXTRACTION W/PHACO Left 08/18/2020   Procedure: CATARACT EXTRACTION PHACO AND INTRAOCULAR LENS PLACEMENT (IOC) LEFT;  Surgeon: Mittie Gaskin, MD;  Location: [facility];  Service: Ophthalmology;  Laterality: Left;  4.87 0:57.0 8.6%   COLONOSCOPY     COLONOSCOPY WITH PROPOFOL  N/A 06/13/2022   Procedure: COLONOSCOPY WITH PROPOFOL ;  Surgeon: Maryruth Ole DASEN, MD;  Location: [facility] ENDOSCOPY;  Service: Endoscopy;  Laterality: N/A;  Please, call her cell # [phone] for her time.   ICD GENERATOR CHANGEOUT N/A 04/15/2019   Procedure: ICD GENERATOR CHANGEOUT;  Surgeon: Waddell Danelle ORN, MD;  Location: [facility] INVASIVE CV LAB;  Service: Cardiovascular;  Laterality: N/A;     Family History  Problem Relation Age of Onset   Hypertension Mother  Heart disease Sister    Heart disease Brother    Breast cancer Neg Hx      Social History   Socioeconomic History   Marital status: Widowed    Spouse name: Not on file   Number of children: Not on file   Years of education: Not on file   Highest education level: Not on file  Occupational History   Not on file  Tobacco Use   Smoking status: Former    Current packs/day: 0.00    Types: Cigarettes    Quit date: 12/14/2008    Years since quitting: 15.7   Smokeless tobacco: Never  Vaping Use   Vaping status: Never Used  Substance  and Sexual Activity   Alcohol use: No   Drug use: No   Sexual activity: Not on file  Other Topics Concern   Not on file  Social History Narrative   Not on file   Social Drivers of Health   Financial Resource Strain: Low Risk  (08/19/2024)   Received from [facility]   Overall Financial Resource Strain (CARDIA)    Difficulty of Paying Living Expenses: Not hard at all  Food Insecurity: No Food Insecurity (08/19/2024)   Received from [facility]   Hunger Vital Sign    Within the past 12 months, you worried that your food would run out before you got the money to buy more.: Never true    Within the past 12 months, the food you bought just didn't last and you didn't have money to get more.: Never true  Transportation Needs: No Transportation Needs (08/19/2024)   Received from [facility]    In the past 12 months, has lack of transportation kept you from medical appointments or from getting medications?: No    Lack of Transportation (Non-Medical): No  Physical Activity: Not on file  Stress: Not on file  Social Connections: Not on file  Intimate Partner Violence: Not on file     BP 110/76   Pulse 68   Ht 5' 11 (1.803 m)   Wt 215 lb (97.5 kg)   SpO2 98%   BMI 29.99 kg/m   Physical Exam:  Well appearing NAD HEENT: Unremarkable Neck:  No JVD, no thyromegally Lymphatics:  No adenopathy Back:  No CVA tenderness Lungs:  Clear with no wheezes HEART:  Regular rate rhythm, no murmurs, no rubs, no clicks Abd:  soft, positive bowel sounds, no organomegally, no rebound, no guarding Ext:  2 plus pulses, no edema, no cyanosis, no clubbing Skin:  No rashes no nodules Neuro:  CN II through XII intact, motor grossly intact  EKG - nsr  DEVICE  Normal device function.  See PaceArt for details.   Assess/Plan:  Chronic systolic heart failure -her symptoms are class 2. She is encouraged to increase her  physical activity. 2. HTN - her bp is a little high today. I asked her to try and lose weight. 3. ICD - her Medtronic DDD ICD is working normally. We will recheck in several months. 4. PAF - she is maintaining NSR. She will continue her current meds. I encouraged her to lose weight. She will continue amiodarone  3 times a week.   Danelle Waddell HERO.D.

## 2024-09-11 NOTE — Patient Instructions (Signed)

## 2024-09-16 ENCOUNTER — Other Ambulatory Visit: Payer: Self-pay | Admitting: Physician Assistant

## 2024-09-19 ENCOUNTER — Telehealth: Payer: Self-pay | Admitting: Student in an Organized Health Care Education/Training Program

## 2024-09-19 MED ORDER — LISINOPRIL 20 MG PO TABS: 20.0000 mg | ORAL_TABLET | Freq: Every day | ORAL | 2 refills | Status: AC

## 2024-09-19 NOTE — Telephone Encounter (Signed)
 Refill sent in

## 2024-09-19 NOTE — Telephone Encounter (Signed)
*  STAT* If patient is at the pharmacy, call can be transferred to refill team.   1. Which medications need to be refilled? (please list name of each medication and dose if known)   lisinopril  (ZESTRIL ) 20 MG tablet   2. Would you like to learn more about the convenience, safety, & potential cost savings by using the [facility]?   3. Are you open to using the [facility] (Type [facility]. ).  4. Which pharmacy/location (including street and city if local pharmacy) is medication to be sent to?  [facility] [address], [place] - [address]   5. Do they need a 30 day or 90 day supply?   90 day  Patient state she is almost out of this medication.

## 2024-10-14 ENCOUNTER — Ambulatory Visit

## 2024-10-14 DIAGNOSIS — I428 Other cardiomyopathies: Secondary | ICD-10-CM | POA: Diagnosis not present

## 2024-10-15 LAB — CUP PACEART REMOTE DEVICE CHECK
Battery Remaining Longevity: 79 mo
Battery Voltage: 2.98 V
Brady Statistic RV Percent Paced: 0 %
Date Time Interrogation Session: 20251202091707
HighPow Impedance: 50 Ohm
HighPow Impedance: 62 Ohm
Implantable Lead Connection Status: 753985
Implantable Lead Implant Date: 20110627
Implantable Lead Location: 753860
Implantable Lead Model: 6947
Implantable Pulse Generator Implant Date: 20200602
Lead Channel Impedance Value: 342 Ohm
Lead Channel Impedance Value: 399 Ohm
Lead Channel Pacing Threshold Amplitude: 1 V
Lead Channel Pacing Threshold Pulse Width: 0.4 ms
Lead Channel Sensing Intrinsic Amplitude: 8.125 mV
Lead Channel Sensing Intrinsic Amplitude: 8.125 mV
Lead Channel Setting Pacing Amplitude: 2.5 V
Lead Channel Setting Pacing Pulse Width: 0.4 ms
Lead Channel Setting Sensing Sensitivity: 0.3 mV
Zone Setting Status: 755011
Zone Setting Status: 755011

## 2024-10-17 NOTE — Progress Notes (Signed)
 Remote ICD Transmission

## 2024-10-19 ENCOUNTER — Ambulatory Visit: Payer: Self-pay | Admitting: Internal Medicine

## 2024-11-04 ENCOUNTER — Other Ambulatory Visit: Payer: Self-pay | Admitting: Internal Medicine

## 2024-11-04 DIAGNOSIS — Z1231 Encounter for screening mammogram for malignant neoplasm of breast: Secondary | ICD-10-CM

## 2024-12-11 ENCOUNTER — Ambulatory Visit
Admission: RE | Admit: 2024-12-11 | Discharge: 2024-12-11 | Disposition: A | Discharge status: Home / Self Care | Source: Ambulatory Visit | Attending: Internal Medicine | Admitting: Internal Medicine

## 2024-12-11 DIAGNOSIS — Z1231 Encounter for screening mammogram for malignant neoplasm of breast: Secondary | ICD-10-CM | POA: Diagnosis present

## 2024-12-12 ENCOUNTER — Other Ambulatory Visit: Payer: Self-pay | Admitting: Internal Medicine

## 2024-12-12 DIAGNOSIS — I480 Paroxysmal atrial fibrillation: Secondary | ICD-10-CM

## 2024-12-12 NOTE — Telephone Encounter (Signed)
 Eliquis  5mg  refill request received. Patient is 74 years old, weight-97.5kg, Crea-1.23 on 03/14/24 and 0.90 on 08/12/24 via [facility], Diagnosis-Afib, and last seen by Dr. Waddell on 09/11/24 and has Georganna Archer 03/17/25. Dose is appropriate based on dosing criteria. Will send in refill to requested pharmacy.

## 2025-01-13 ENCOUNTER — Encounter

## 2025-03-17 ENCOUNTER — Ambulatory Visit: Admitting: Student in an Organized Health Care Education/Training Program

## 2025-04-14 ENCOUNTER — Encounter

## 2025-07-14 ENCOUNTER — Encounter

## 2025-10-13 ENCOUNTER — Encounter
